# Patient Record
Sex: Male | Born: 1980
Health system: Southern US, Community
[De-identification: ages and names within clinical notes are randomized; demographics above are authoritative.]

## PROBLEM LIST (undated history)

## (undated) DIAGNOSIS — I499 Cardiac arrhythmia, unspecified: Secondary | ICD-10-CM

## (undated) DIAGNOSIS — K219 Gastro-esophageal reflux disease without esophagitis: Secondary | ICD-10-CM

## (undated) DIAGNOSIS — T7840XA Allergy, unspecified, initial encounter: Secondary | ICD-10-CM

## (undated) DIAGNOSIS — J45909 Unspecified asthma, uncomplicated: Secondary | ICD-10-CM

## (undated) HISTORY — DX: Allergy, unspecified, initial encounter: T78.40XA

## (undated) HISTORY — DX: Unspecified asthma, uncomplicated: J45.909

## (undated) HISTORY — DX: Cardiac arrhythmia, unspecified: I49.9

## (undated) HISTORY — DX: Gastro-esophageal reflux disease without esophagitis: K21.9

---

## 2011-09-28 ENCOUNTER — Ambulatory Visit (INDEPENDENT_AMBULATORY_CARE_PROVIDER_SITE_OTHER): Payer: 59

## 2011-09-28 DIAGNOSIS — K047 Periapical abscess without sinus: Secondary | ICD-10-CM

## 2011-09-28 DIAGNOSIS — K029 Dental caries, unspecified: Secondary | ICD-10-CM

## 2011-10-01 ENCOUNTER — Ambulatory Visit (INDEPENDENT_AMBULATORY_CARE_PROVIDER_SITE_OTHER): Payer: 59

## 2011-10-01 DIAGNOSIS — R05 Cough: Secondary | ICD-10-CM

## 2011-10-01 DIAGNOSIS — R059 Cough, unspecified: Secondary | ICD-10-CM

## 2011-10-01 DIAGNOSIS — R509 Fever, unspecified: Secondary | ICD-10-CM

## 2011-10-01 DIAGNOSIS — J111 Influenza due to unidentified influenza virus with other respiratory manifestations: Secondary | ICD-10-CM

## 2011-12-19 ENCOUNTER — Ambulatory Visit (INDEPENDENT_AMBULATORY_CARE_PROVIDER_SITE_OTHER): Payer: 59 | Admitting: Physician Assistant

## 2011-12-19 VITALS — BP 112/74 | HR 51 | Temp 98.3°F | Resp 16 | Ht 71.0 in | Wt 202.0 lb

## 2011-12-19 DIAGNOSIS — K089 Disorder of teeth and supporting structures, unspecified: Secondary | ICD-10-CM

## 2011-12-19 DIAGNOSIS — K029 Dental caries, unspecified: Secondary | ICD-10-CM

## 2011-12-19 DIAGNOSIS — K0889 Other specified disorders of teeth and supporting structures: Secondary | ICD-10-CM

## 2011-12-19 MED ORDER — HYDROCODONE-ACETAMINOPHEN 5-325 MG PO TABS: 1.0000 | ORAL_TABLET | Freq: Four times a day (QID) | ORAL | Status: AC | PRN

## 2011-12-19 MED ORDER — AMOXICILLIN 875 MG PO TABS: 875.0000 mg | ORAL_TABLET | Freq: Two times a day (BID) | ORAL | Status: AC

## 2011-12-19 NOTE — Patient Instructions (Addendum)
You may take up to 800 mg of ibuprofen, up to 3 times daily, but not more each dose or more times per day.  Good luck finding a new dentist!

## 2011-12-19 NOTE — Progress Notes (Signed)
  Subjective:    Patient ID: Bobby Hobbs, male    DOB: 1981-02-09, 30 y.o.   MRN: [id number]  HPI This patient presents, accompanied by his wife, complaining of severe dental pain that began last night. Unable to sleep. He is between dentist. He has a chronic dental problems-his teeth decaying teeth with good care. His father had the same problem the patient works at [employer] center. His wife reports that he feels warm to touch but he denies any fever. No nasal congestion or cough. Wife also notes some mild fullness on the right side of his face and he reports a lump palpable through the cheek on the right jaw.   Review of Systems As above.    Objective:   Physical Exam  Vital signs noted. Well-developed, well nourished WM who is awake, alert and oriented, in NAD. HEENT: [place]/AT, PERRL, EOMI.  Sclera and conjunctiva are clear.  EAC are patent, TMs are normal in appearance. Nasal mucosa is pink and moist. OP is clear. Significant dental decay, worse on the bottom row of teeth, primarily the molars. Several of them are rotted down to the gumline. There is edema noted of the gingiva. I palpate the lump in the right mid jaw. Neck: supple, non-tender, no lymphadenopathey, thyromegaly. Fullness in the right tonsillar region. Heart: RRR, no murmur Lungs: CTA Abdomen: normo-active bowel sounds, supple, non-tender, no mass or organomegaly. Extremities: no cyanosis, clubbing or edema. Skin: warm and dry without rash.       Assessment & Plan:  Dental pain, decay. Probable early abscess.  Amoxicillin 875 mg, 1 by mouth twice a day x10 days. Norco 5/325 mg one to 2 by mouth every 6 hours when necessary pain rest, fluids. 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

## 2012-04-22 ENCOUNTER — Ambulatory Visit (INDEPENDENT_AMBULATORY_CARE_PROVIDER_SITE_OTHER): Payer: 59 | Admitting: Emergency Medicine

## 2012-04-22 VITALS — BP 105/71 | HR 56 | Temp 98.0°F | Resp 18 | Ht 72.75 in | Wt 203.0 lb

## 2012-04-22 DIAGNOSIS — J019 Acute sinusitis, unspecified: Secondary | ICD-10-CM

## 2012-04-22 DIAGNOSIS — J309 Allergic rhinitis, unspecified: Secondary | ICD-10-CM

## 2012-04-22 MED ORDER — AMOXICILLIN-POT CLAVULANATE 875-125 MG PO TABS: 1.0000 | ORAL_TABLET | Freq: Two times a day (BID) | ORAL | Status: AC

## 2012-04-22 MED ORDER — FLUTICASONE PROPIONATE 50 MCG/ACT NA SUSP: 2.0000 | Freq: Every day | NASAL | Status: DC

## 2012-04-22 NOTE — Progress Notes (Signed)
  Subjective:    Patient ID: Bobby Hobbs, male    DOB: July 07, 1981, 30 y.o.   MRN: [id number]  HPI  Pt with nasal drainage and congestion notes drainage at time is clear at times green he has had symptoms for 3 days now. Has history of allergic rhinitis. Symptoms seem to be worsened with weather, damp and warm past few weeks. Also recently has had complaints of facial pressure.  Review of Systems     Objective:   Physical Exam HEENT TMs are clear. Nose is congested turbinates are blue and swollen and red. Neck is supple chest is clear to auscultation and percussion        Assessment & Plan:  Allergic rhinitis and Sinusitis Augmentin 875 mg for 10 days Flonase Rx given

## 2012-04-22 NOTE — Patient Instructions (Addendum)

## 2013-02-27 ENCOUNTER — Ambulatory Visit (INDEPENDENT_AMBULATORY_CARE_PROVIDER_SITE_OTHER): Payer: 59 | Admitting: Family Medicine

## 2013-02-27 VITALS — BP 111/68 | HR 108 | Temp 98.8°F | Resp 18 | Ht 72.0 in | Wt 206.0 lb

## 2013-02-27 DIAGNOSIS — E860 Dehydration: Secondary | ICD-10-CM

## 2013-02-27 DIAGNOSIS — I499 Cardiac arrhythmia, unspecified: Secondary | ICD-10-CM

## 2013-02-27 DIAGNOSIS — R112 Nausea with vomiting, unspecified: Secondary | ICD-10-CM

## 2013-02-27 DIAGNOSIS — R197 Diarrhea, unspecified: Secondary | ICD-10-CM

## 2013-02-27 LAB — COMPREHENSIVE METABOLIC PANEL
Albumin: 4.7 g/dL (ref 3.5–5.2)
CO2: 25 mEq/L (ref 19–32)
Calcium: 9.5 mg/dL (ref 8.4–10.5)
Glucose, Bld: 104 mg/dL — ABNORMAL HIGH (ref 70–99)
Potassium: 4 mEq/L (ref 3.5–5.3)
Sodium: 137 mEq/L (ref 135–145)
Total Protein: 6.9 g/dL (ref 6.0–8.3)

## 2013-02-27 LAB — POCT URINALYSIS DIPSTICK
Bilirubin, UA: NEGATIVE
Glucose, UA: NEGATIVE
Ketones, UA: NEGATIVE
Leukocytes, UA: NEGATIVE
Protein, UA: NEGATIVE

## 2013-02-27 LAB — POCT CBC
Granulocyte percent: 89.3 %G — AB (ref 37–80)
HCT, POC: 43.1 % — AB (ref 43.5–53.7)
Hemoglobin: 13.9 g/dL — AB (ref 14.1–18.1)
Lymph, poc: 0.7 (ref 0.6–3.4)
MCHC: 32.3 g/dL (ref 31.8–35.4)
MCV: 87.8 fL (ref 80–97)
POC Granulocyte: 8.8 — AB (ref 2–6.9)

## 2013-02-27 LAB — POCT UA - MICROSCOPIC ONLY
RBC, urine, microscopic: NEGATIVE
WBC, Ur, HPF, POC: NEGATIVE

## 2013-02-27 MED ORDER — CIPROFLOXACIN HCL 500 MG PO TABS: 500.0000 mg | ORAL_TABLET | Freq: Two times a day (BID) | ORAL | Status: DC

## 2013-02-27 NOTE — Progress Notes (Signed)
[facility] [address] [place] [address] [phone]  Date:  02/27/2013   Name:  Bobby Hobbs   DOB:  22-Jun-1981   MRN:  [id number]  PCP:  No PCP Per Patient    Chief Complaint: Emesis and Diarrhea   History of Present Illness:  Bobby Hobbs is a 32 y.o. very pleasant male patient who presents with the following:  He is here today with possible food poisoning.  Last night around 11 pm he had onset of diarrhea, stomach cramps and vomiting.  He finally stopped vomiting around 0500 today. He still has diarrhea- last episode a few minutes ago.   He has not seen any blood in his vomit or emesis.    He had a temp of 99.7 this am.  He has noted chills and aches- these are now getting better.    He ate tuna last night but did not think it was bad.  It "tasted fine."  No other suspicious foods.  He has been able to tolerate some ginger ale and saltines so far today.   He does not have abdominal pain currently- he did have cramps last night  He is otherwise generally healhty.    No recent abx use.  No sick contacts known.  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  He is generally healthy and feels ok, but does need a note for work.    There are no active problems to display for this patient.   No past medical history on file.  No past surgical history on file.  History  Substance Use Topics  . Smoking status: Never Smoker   . Smokeless tobacco: Not on file  . Alcohol Use: 0.0 oz/week     Comment: once a year    Family History  Problem Relation Age of Onset  . Diabetes Father   . Diabetes Maternal Grandmother     Allergies  Allergen Reactions  . Hycodan (Hydrocodone-Homatropine) Nausea And Vomiting    Tolerates hydrocodone with acetaminophen    Medication list has been reviewed and updated.  Current Outpatient Prescriptions on File Prior to Visit  Medication Sig Dispense Refill  .  fluticasone (FLONASE) 50 MCG/ACT nasal spray Place 2 sprays into the nose daily.  16 g  6   No current facility-administered medications on file prior to visit.    Review of Systems:  As per HPI- otherwise negative.   Physical Examination: Filed Vitals:   02/27/13 0838  BP: 94/64  Pulse: 62  Temp: 98.8 F (37.1 C)  Resp: 18   Filed Vitals:   02/27/13 0838  Height: 6' (1.829 m)  Weight: 206 lb (93.441 kg)   Body mass index is 27.93 kg/(m^2). Ideal Body Weight: Weight in (lb) to have BMI = 25: 183.9  GEN: WDWN, NAD, Non-toxic, A & O x 3, slightly pale in appearance HEENT: Atraumatic, Normocephalic. Neck supple. No masses, No LAD. Ears and Nose: No external deformity. CV: RRR with skipped beats- suspect PVC's, No M/G/R. No JVD. No thrill. No extra heart sounds. PULM: CTA B, no wheezes, crackles, rhonchi. No retractions. No resp. distress. No accessory muscle use. ABD: S, NT, ND, +BS. No rebound. No HSM.  Abdomen is benign EXTR: No c/c/e NEURO Normal gait.  PSYCH: Normally interactive. Conversant. Not depressed or anxious appearing.  Calm demeanor.    Results for orders placed in visit on 02/27/13  POCT CBC  Result Value Range   WBC 9.9  4.6 - 10.2 K/uL   Lymph, poc 0.7  0.6 - 3.4   POC LYMPH PERCENT 7.1 (*) 10 - 50 %L   MID (cbc) 0.4  0 - 0.9   POC MID % 3.6  0 - 12 %M   POC Granulocyte 8.8 (*) 2 - 6.9   Granulocyte percent 89.3 (*) 37 - 80 %G   RBC 4.91  4.69 - 6.13 M/uL   Hemoglobin 13.9 (*) 14.1 - 18.1 g/dL   HCT, POC 16.1 (*) 09.6 - 53.7 %   MCV 87.8  80 - 97 fL   MCH, POC 28.3  27 - 31.2 pg   MCHC 32.3  31.8 - 35.4 g/dL   RDW, POC 04.5     Platelet Count, POC 161  142 - 424 K/uL   MPV 11.4  0 - 99.8 fL   Performed EKG due to irregular rhythm auscultated.  Assume PVC's.  He denies any CP, SOB, palpitations or syncope. No unusual family cardiac history.   EKG: SR with a few PVCs, occasional PVCs on rhythm strip. No concerning ST elevation or depression     Noted to have BP of 80/60 on recheck- decided to give a liter of IVF.  Gave fluids and BP to 100/80, he felt better.  Final BP check 110/70 prior to discharge home.  His color was improved and he felt comfortable going home.   Assessment and Plan: Diarrhea - Plan: Comprehensive metabolic panel, ciprofloxacin (CIPRO) 500 MG tablet  Nausea and vomiting - Plan: POCT CBC, Comprehensive metabolic panel  Arrhythmia - Plan: EKG 12-Lead  Possible food borne illness.  He is getting better from a vomiting standpoint but diarrhea continues.  He would like to go home and work on oral rehydration.  Given rx for cipro which he can use if the diarrhea continues today.  However, his illness may already be resolving. He may use imodium with caution.    Signed Abbe Amsterdam, MD  Called pt to come in for a recheck when I received his creatinine.  He is feeling ok, checked orthostatic BP and urine.   BP stable on orthostatic testing, but pulse did come up more than 20 BPM.  Urine looks fine.  He is feeling better, has been tolerating liquids.  Some diarrhea still but no vomiting.  He wants to continue to rehydrate by mouth.  Plan to recheck BMP in one week.   He states his typical BP is 110/70  Results for orders placed in visit on 02/27/13  COMPREHENSIVE METABOLIC PANEL      Result Value Range   Sodium 137  135 - 145 mEq/L   Potassium 4.0  3.5 - 5.3 mEq/L   Chloride 101  96 - 112 mEq/L   CO2 25  19 - 32 mEq/L   Glucose, Bld 104 (*) 70 - 99 mg/dL   BUN 12  6 - 23 mg/dL   Creat 4.09 (*) 8.11 - 1.35 mg/dL   Total Bilirubin 1.2  0.3 - 1.2 mg/dL   Alkaline Phosphatase 70  39 - 117 U/L   AST 18  0 - 37 U/L   ALT 26  0 - 53 U/L   Total Protein 6.9  6.0 - 8.3 g/dL   Albumin 4.7  3.5 - 5.2 g/dL   Calcium 9.5  8.4 - 91.4 mg/dL  POCT CBC      Result Value Range   WBC 9.9  4.6 -  10.2 K/uL   Lymph, poc 0.7  0.6 - 3.4   POC LYMPH PERCENT 7.1 (*) 10 - 50 %L   MID (cbc) 0.4  0 - 0.9   POC MID % 3.6  0 -  12 %M   POC Granulocyte 8.8 (*) 2 - 6.9   Granulocyte percent 89.3 (*) 37 - 80 %G   RBC 4.91  4.69 - 6.13 M/uL   Hemoglobin 13.9 (*) 14.1 - 18.1 g/dL   HCT, POC 16.1 (*) 09.6 - 53.7 %   MCV 87.8  80 - 97 fL   MCH, POC 28.3  27 - 31.2 pg   MCHC 32.3  31.8 - 35.4 g/dL   RDW, POC 04.5     Platelet Count, POC 161  142 - 424 K/uL   MPV 11.4  0 - 99.8 fL  POCT UA - MICROSCOPIC ONLY      Result Value Range   WBC, Ur, HPF, POC NEG     RBC, urine, microscopic NEG     Bacteria, U Microscopic NEG     Mucus, UA NEG     Epithelial cells, urine per micros NEG     Crystals, Ur, HPF, POC NEG     Casts, Ur, LPF, POC NEG     Yeast, UA NEG    POCT URINALYSIS DIPSTICK      Result Value Range   Color, UA YELLOW     Clarity, UA CLEAR     Glucose, UA NEG     Bilirubin, UA NEG     Ketones, UA NEG     Spec Grav, UA 1.010     Blood, UA NEG     pH, UA 6.0     Protein, UA NEG     Urobilinogen, UA 0.2     Nitrite, UA NEG     Leukocytes, UA Negative

## 2013-02-27 NOTE — Patient Instructions (Addendum)
Rest and drink plenty of fluids today, and eat foods with salt such as pretzels or crackers to help rehydrate yourself.  gatorade is also a good idea.  Eat a bland diet for the next few days- simple carbs and easy to digest foods.  If you need to you can use some imodium OTC.  If the diarrhea continues today your can start the cipro antibiotic rx- take 1 pill twice a day for 3 days   If you are not feeling a lot better over the next 2 days or so please give me a call- come back sooner if worse.

## 2013-06-03 ENCOUNTER — Telehealth: Payer: Self-pay

## 2013-06-03 ENCOUNTER — Ambulatory Visit (INDEPENDENT_AMBULATORY_CARE_PROVIDER_SITE_OTHER): Payer: 59 | Admitting: Family Medicine

## 2013-06-03 VITALS — BP 118/82 | HR 64 | Temp 98.2°F | Resp 14 | Ht 71.0 in | Wt 218.6 lb

## 2013-06-03 DIAGNOSIS — K029 Dental caries, unspecified: Secondary | ICD-10-CM | POA: Insufficient documentation

## 2013-06-03 DIAGNOSIS — K044 Acute apical periodontitis of pulpal origin: Secondary | ICD-10-CM

## 2013-06-03 DIAGNOSIS — K0889 Other specified disorders of teeth and supporting structures: Secondary | ICD-10-CM

## 2013-06-03 DIAGNOSIS — K047 Periapical abscess without sinus: Secondary | ICD-10-CM

## 2013-06-03 MED ORDER — HYDROCODONE-ACETAMINOPHEN 5-325 MG PO TABS: 1.0000 | ORAL_TABLET | Freq: Three times a day (TID) | ORAL | Status: DC | PRN

## 2013-06-03 MED ORDER — OXYCODONE-ACETAMINOPHEN 5-325 MG PO TABS: 1.0000 | ORAL_TABLET | Freq: Three times a day (TID) | ORAL | Status: DC | PRN

## 2013-06-03 MED ORDER — AMOXICILLIN 875 MG PO TABS: 875.0000 mg | ORAL_TABLET | Freq: Two times a day (BID) | ORAL | Status: DC

## 2013-06-03 NOTE — Patient Instructions (Addendum)
Use the amoxicillin antibiotic as directed, and keep your mouth clean with frequent rinses.    Call your dentist ASAP!  If you are getting worse, have a fever, etc please let us know.

## 2013-06-03 NOTE — Telephone Encounter (Signed)
Dr. Patsy Lager please advise on further pain meds for this pt that you saw this morning

## 2013-06-03 NOTE — Telephone Encounter (Signed)
JESSICA STATES THE PAIN MEDICINE HE WAS GIVEN FOR THE ABSCESS ISN'T TOUCHING THE PAIN. NEED SOMETHING ELSE STRONGER PLEASE CALL [phone]   [facility] ON [address]

## 2013-06-03 NOTE — Telephone Encounter (Signed)
Called him back- spoke with pt and his wife.  He has tried taking 2 of the hydrocodone pills but has not had relief.  His tooth pain has gotten worse.  Will put an rx for percocet up front for his wife to pick up.  If not helpful they are to come back in, go to the ER or visit dentist asap.

## 2013-06-03 NOTE — Progress Notes (Signed)
[facility] [address] [place] [address] [phone]  Date:  06/03/2013   Name:  Bobby Hobbs   DOB:  [birth year]   MRN:  [id number]  PCP:  No PCP Per Patient    Chief Complaint: Dental Pain   History of Present Illness:  Bobby Hobbs is a 32 y.o. very pleasant male patient who presents with the following:  Here today with dental pain.  He was here with same 12/2011, treated with amoxicillin and norco.  He has a history of nausea and vomiting with hycodan, but is able to tolerate hydrocodone/ apap.  Otherwise generally healthy.   Checked controlled substance database- clear this year.    He is afraid he has an abscessed tooth on the lower right jaw.   He noted this yesterday.   He has not had trouble with this particular tooth in the past.   He has a family history of poor teeth and frequent caries.   He has not noted any fevers or chills, but does have some swelling. He plans to make an appt with his dentist today.    There are no active problems to display for this patient.   Past Medical History  Diagnosis Date  . Allergy     History reviewed. No pertinent past surgical history.  History  Substance Use Topics  . Smoking status: Never Smoker   . Smokeless tobacco: Not on file  . Alcohol Use: 0.0 oz/week     Comment: once a year    Family History  Problem Relation Age of Onset  . Diabetes Father   . Diabetes Maternal Grandmother     Allergies  Allergen Reactions  . Hycodan [Hydrocodone-Homatropine] Nausea And Vomiting    Tolerates hydrocodone with acetaminophen    Medication list has been reviewed and updated.  Current Outpatient Prescriptions on File Prior to Visit  Medication Sig Dispense Refill  . ciprofloxacin (CIPRO) 500 MG tablet Take 1 tablet (500 mg total) by mouth 2 (two) times daily.  6 tablet  0  . fluticasone (FLONASE) 50 MCG/ACT nasal spray Place 2 sprays into the nose daily.  16 g  6   No  current facility-administered medications on file prior to visit.    Review of Systems:  As per HPI- otherwise negative. He is otherwise generally healthy  Physical Examination: Filed Vitals:   06/03/13 0813  BP: 118/82  Pulse: 64  Temp: 98.2 F (36.8 C)  Resp: 14   Filed Vitals:   06/03/13 0813  Height: 5\' 11"  (1.803 m)  Weight: 218 lb 9.6 oz (99.156 kg)   Body mass index is 30.5 kg/(m^2). Ideal Body Weight: Weight in (lb) to have BMI = 25: 178.9  GEN: WDWN, NAD, Non-toxic, A & O x 3, looks well HEENT: Atraumatic, Normocephalic. Neck supple. No masses, No LAD.  Bilateral TM wnl, oropharynx normal.  PEERL,EOMI.   Very poor dentition.  He has multiple broken teeth.  He indicates tenderness on the right mandibular area- all of these teeth are broken off, and there is redness and irritation around the gums.  No abscess or apparent fluctuance.   Ears and Nose: No external deformity. CV: RRR, No M/G/R. No JVD. No thrill. No extra heart sounds. PULM: CTA B, no wheezes, crackles, rhonchi. No retractions. No resp. distress. No accessory muscle use. EXTR: No c/c/e NEURO Normal gait.  PSYCH: Normally interactive. Conversant. Not depressed or anxious appearing.  Calm demeanor.    Assessment  and Plan: Dental infection - Plan: amoxicillin (AMOXIL) 875 MG tablet  Pain due to dental caries - Plan: HYDROcodone-acetaminophen (NORCO/VICODIN) 5-325 MG per tablet  Recurrent dental infection, very poor dentition and multiple caries and broken teeth  Treat with amoxicillin and good hygiene.  He is able to take hydrocodone apap for pain.  Cautioned regarding sedation.  He will follow-up with his dentist asap.   Signed Abbe Amsterdam, MD

## 2013-06-05 ENCOUNTER — Telehealth: Payer: Self-pay

## 2013-06-05 NOTE — Telephone Encounter (Signed)
Needs a return to work to be off for today and tomorrow, was seen by Dr. Patsy Lager on 06/03/13- abscess.   Pleas call when ready  [phone]

## 2013-06-06 NOTE — Telephone Encounter (Signed)
Note provided  patient advised . 

## 2013-10-22 ENCOUNTER — Ambulatory Visit (INDEPENDENT_AMBULATORY_CARE_PROVIDER_SITE_OTHER): Payer: 59 | Admitting: Family Medicine

## 2013-10-22 VITALS — BP 120/80 | HR 84 | Temp 98.1°F | Resp 18 | Ht 71.0 in | Wt 212.0 lb

## 2013-10-22 DIAGNOSIS — J3489 Other specified disorders of nose and nasal sinuses: Secondary | ICD-10-CM

## 2013-10-22 DIAGNOSIS — R059 Cough, unspecified: Secondary | ICD-10-CM

## 2013-10-22 DIAGNOSIS — R0981 Nasal congestion: Secondary | ICD-10-CM

## 2013-10-22 DIAGNOSIS — J029 Acute pharyngitis, unspecified: Secondary | ICD-10-CM

## 2013-10-22 DIAGNOSIS — R05 Cough: Secondary | ICD-10-CM

## 2013-10-22 LAB — POCT INFLUENZA A/B
Influenza A, POC: NEGATIVE
Influenza B, POC: NEGATIVE

## 2013-10-22 LAB — POCT RAPID STREP A (OFFICE): RAPID STREP A SCREEN: NEGATIVE

## 2013-10-22 NOTE — Progress Notes (Signed)
[facility] [address], [place] [address] [phone]  Date:  10/22/2013   Name:  Bobby Hobbs   DOB:  [birth year]   MRN:  [id number]  PCP:  No PCP Per Patient    Chief Complaint: Sore Throat and Cough   History of Present Illness:  Bobby Hobbs is a 33 y.o. very pleasant male patient who presents with the following:  Today is Tuesday. On Saturday he noted a ST but did not thnk too much of it.  However yesterday he noted a more severe ST.  He is not sure but thinks he had a fever last night.  He has felt nauseated, had a HA, stuffy nose, and cough.  The cough can be productive sometimes.  The phelm that he coughs up is generally yellow He has had some diarrhea as well- on occasion.    No fever reducers today.   He is generally healthy  Patient Active Problem List   Diagnosis Date Noted  . Dental caries 06/03/2013    Past Medical History  Diagnosis Date  . Allergy     History reviewed. No pertinent past surgical history.  History  Substance Use Topics  . Smoking status: Never Smoker   . Smokeless tobacco: Not on file  . Alcohol Use: 0.0 oz/week     Comment: once a year    Family History  Problem Relation Age of Onset  . Diabetes Father   . Diabetes Maternal Grandmother     Allergies  Allergen Reactions  . Hycodan [Hydrocodone-Homatropine] Nausea And Vomiting    Tolerates hydrocodone with acetaminophen    Medication list has been reviewed and updated.  Current Outpatient Prescriptions on File Prior to Visit  Medication Sig Dispense Refill  . fluticasone (FLONASE) 50 MCG/ACT nasal spray Place 2 sprays into the nose daily.  16 g  6  . HYDROcodone-acetaminophen (NORCO/VICODIN) 5-325 MG per tablet Take 1 tablet by mouth every 8 (eight) hours as needed for pain.  20 tablet  0  . oxyCODONE-acetaminophen (ROXICET) 5-325 MG per tablet Take 1 tablet by mouth every 8 (eight) hours as needed for pain.  15 tablet  0   No  current facility-administered medications on file prior to visit.    Review of Systems:  As per HPI- otherwise negative.   Physical Examination: Filed Vitals:   10/22/13 0804  BP: 120/80  Pulse: 84  Temp: 98.1 F (36.7 C)  Resp: 18   Filed Vitals:   10/22/13 0804  Height: 5\' 11"  (1.803 m)  Weight: 212 lb (96.163 kg)   Body mass index is 29.58 kg/(m^2). Ideal Body Weight: Weight in (lb) to have BMI = 25: 178.9  GEN: WDWN, NAD, Non-toxic, A & O x 3, looks well HEENT: Atraumatic, Normocephalic. Neck supple. No masses, No LAD.  Bilateral TM wnl, oropharynx normal.  PEERL,EOMI.   Nasal cavity inflamed  Ears and Nose: No external deformity. CV: RRR, No M/G/R. No JVD. No thrill. No extra heart sounds. PULM: CTA B, no wheezes, crackles, rhonchi. No retractions. No resp. distress. No accessory muscle use. ABD: S, NT, ND, +BS. No rebound. No HSM. EXTR: No c/c/e NEURO Normal gait.  PSYCH: Normally interactive. Conversant. Not depressed or anxious appearing.  Calm demeanor.   Results for orders placed in visit on 10/22/13  POCT RAPID STREP A (OFFICE)      Result Value Range   Rapid Strep A Screen Negative  Negative  POCT INFLUENZA A/B  Result Value Range   Influenza A, POC Negative     Influenza B, POC Negative       Assessment and Plan: Acute pharyngitis - Plan: POCT rapid strep A, POCT Influenza A/B  Cough - Plan: POCT Influenza A/B  Nasal congestion - Plan: POCT Influenza A/B  Likely viral URI. Treat with OTC medications as needed.  Note for his job See patient instructions for more details.     Signed Lamar Blinks, MD

## 2013-10-22 NOTE — Patient Instructions (Signed)
Use some delsym OTC for cough and ibuprofen or tylenol as needed.  If you are not feeling better soon please let me know- Sooner if worse.

## 2013-11-16 ENCOUNTER — Ambulatory Visit (INDEPENDENT_AMBULATORY_CARE_PROVIDER_SITE_OTHER): Payer: 59 | Admitting: Family Medicine

## 2013-11-16 VITALS — BP 100/66 | HR 74 | Temp 98.0°F | Resp 18 | Wt 216.0 lb

## 2013-11-16 DIAGNOSIS — J329 Chronic sinusitis, unspecified: Secondary | ICD-10-CM

## 2013-11-16 DIAGNOSIS — J019 Acute sinusitis, unspecified: Secondary | ICD-10-CM

## 2013-11-16 DIAGNOSIS — R059 Cough, unspecified: Secondary | ICD-10-CM

## 2013-11-16 DIAGNOSIS — R05 Cough: Secondary | ICD-10-CM

## 2013-11-16 DIAGNOSIS — J209 Acute bronchitis, unspecified: Secondary | ICD-10-CM

## 2013-11-16 MED ORDER — CEFDINIR 300 MG PO CAPS: 600.0000 mg | ORAL_CAPSULE | Freq: Every day | ORAL | Status: DC

## 2013-11-16 MED ORDER — GUAIFENESIN-CODEINE 100-10 MG/5ML PO SOLN: 10.0000 mL | ORAL | Status: DC | PRN

## 2013-11-16 MED ORDER — BENZONATATE 100 MG PO CAPS: 100.0000 mg | ORAL_CAPSULE | Freq: Three times a day (TID) | ORAL | Status: DC | PRN

## 2013-11-16 NOTE — Patient Instructions (Signed)
Drink plenty of fluids and try to get sufficient rest  Take the antibiotic, Omnicef, one twice daily  Use the cough syrup 1-2 teaspoons every 4-6 hours as needed for cough. It will make your little drowsy. You may not want to take if you're going to work.  Use the Tessalon(benzonatate) one to 2 pills every 6 hours as needed for mild or cough. This tends to not cause drowsiness it can be taken when you're working.  Return if worse

## 2013-11-16 NOTE — Progress Notes (Signed)
Subjective: 33 year old man who has been here last month with a respiratory tract infection. He improved after that visit, but cough never went away and is continued to since. Lately it has gotten worse. He is coughing up more phlegm. It bothers him at nighttime. He does not smoke. He is not wheezing. He doesn't have a lot of of sinus complaints or sore throat or your pains. It is mostly the cough. Yesterday he did vomit a couple of times.  Objective: Fairly healthy-appearing young man in no major distress but he does have a hacking cough. His TMs are normal. Sinuses nontender. Throat not erythematous. Neck supple without significant nodes. Chest is clear to auscultation. Heart regular without murmurs.  Assessment: Cough, persistent Probable sinusitis/bronchitis  Plan: Will go ahead and give a round of antibiotics, Omnicef. If he is not doing better we'll need to do further workup.

## 2013-11-29 ENCOUNTER — Ambulatory Visit (INDEPENDENT_AMBULATORY_CARE_PROVIDER_SITE_OTHER): Payer: 59 | Admitting: Physician Assistant

## 2013-11-29 VITALS — BP 122/74 | HR 85 | Temp 97.9°F | Resp 16 | Ht 70.75 in | Wt 214.6 lb

## 2013-11-29 DIAGNOSIS — K029 Dental caries, unspecified: Secondary | ICD-10-CM

## 2013-11-29 DIAGNOSIS — K0889 Other specified disorders of teeth and supporting structures: Secondary | ICD-10-CM

## 2013-11-29 DIAGNOSIS — K089 Disorder of teeth and supporting structures, unspecified: Secondary | ICD-10-CM

## 2013-11-29 MED ORDER — HYDROCODONE-ACETAMINOPHEN 5-325 MG PO TABS: 1.0000 | ORAL_TABLET | Freq: Three times a day (TID) | ORAL | Status: DC | PRN

## 2013-11-29 MED ORDER — AMOXICILLIN 875 MG PO TABS: 875.0000 mg | ORAL_TABLET | Freq: Two times a day (BID) | ORAL | Status: DC

## 2013-11-29 NOTE — Progress Notes (Signed)
   Subjective:    Patient ID: [place], male    DOB: 1980/12/31, 33 y.o.   MRN: [id number]  HPI 33 year old male presents for evaluation of right sided dental pain that started last night. Admits the pain has improved slightly but he does continue to have discomfort in the right lower jaw.  Hx of dental caries and abscesses - wanted to come in for antibiotics right away so that symptoms do not worsen. He does plan on calling a dentist today to establish care.  Denies fever, chills, nausea, vomiting, headache, trismus.  He took a leftover Norco last night that did not relieve his pain.   Patient is otherwise doing well with no other concerns today.      Review of Systems  Constitutional: Negative for fever and chills.  HENT: Positive for dental problem. Negative for sore throat.   Gastrointestinal: Negative for nausea and vomiting.       Objective:   Physical Exam  Constitutional: He is oriented to person, place, and time. He appears well-developed and well-nourished.  HENT:  Head: Normocephalic and atraumatic.  Right Ear: External ear normal.  Left Ear: External ear normal.  Mouth/Throat: Uvula is midline, oropharynx is clear and moist and mucous membranes are normal. No oral lesions. No trismus in the jaw. Abnormal dentition. Dental caries present. No dental abscesses.  Eyes: Conjunctivae are normal.  Neck: Normal range of motion. Neck supple.  Cardiovascular: Normal rate.   Pulmonary/Chest: Effort normal.  Neurological: He is alert and oriented to person, place, and time.  Psychiatric: He has a normal mood and affect. His behavior is normal. Judgment and thought content normal.          Assessment & Plan:  Pain, dental - Plan: amoxicillin (AMOXIL) 875 MG tablet, HYDROcodone-acetaminophen (NORCO) 5-325 MG per tablet  Dental caries - Plan: amoxicillin (AMOXIL) 875 MG tablet, HYDROcodone-acetaminophen (NORCO) 5-325 MG per tablet  Patient has multiple dental  caries and broken teeth. No abscess at this time but will place on amoxicillin 875 mg bid x 10 days. Norco5/325 mg q8hours prn pain. Call dentist today to establish care. Patient understands and will do so. Follow up here or in ED if acutely worsening or if symptoms fail to improve.

## 2014-03-04 ENCOUNTER — Encounter: Payer: Self-pay | Admitting: Family Medicine

## 2014-04-15 ENCOUNTER — Ambulatory Visit (INDEPENDENT_AMBULATORY_CARE_PROVIDER_SITE_OTHER): Payer: 59 | Admitting: Physician Assistant

## 2014-04-15 VITALS — BP 106/81 | HR 66 | Temp 98.0°F | Resp 18 | Wt 216.0 lb

## 2014-04-15 DIAGNOSIS — R0981 Nasal congestion: Secondary | ICD-10-CM

## 2014-04-15 DIAGNOSIS — J3489 Other specified disorders of nose and nasal sinuses: Secondary | ICD-10-CM

## 2014-04-15 DIAGNOSIS — R05 Cough: Secondary | ICD-10-CM

## 2014-04-15 DIAGNOSIS — J0100 Acute maxillary sinusitis, unspecified: Secondary | ICD-10-CM

## 2014-04-15 DIAGNOSIS — R059 Cough, unspecified: Secondary | ICD-10-CM

## 2014-04-15 MED ORDER — AMOXICILLIN-POT CLAVULANATE 875-125 MG PO TABS: 1.0000 | ORAL_TABLET | Freq: Two times a day (BID) | ORAL | Status: DC

## 2014-04-15 NOTE — Progress Notes (Signed)
   Subjective:    Patient ID: Bobby Hobbs, male    DOB: 09-21-1981, 33 y.o.   MRN: [id number]  HPI  33 year old male presents for evaluation of 3-4 day history of nasal congestion, sinus headache, sinus pain, and PND.  Has slight tickle in his throat that causes occasional cough but no wheezing, SOB, or chest pain.  Complains of a lot of thick nasal discharge. Had 1 episode of subjective chills last night. No documented fever.  Denies nausea, vomiting, dizziness, otalgia, sore throat, or hemoptysis.  Has taken OTC cold medicine that has not helped much.  Patient is otherwise doing well with no other concerns today.    Review of Systems  Constitutional: Positive for chills. Negative for fever.  HENT: Positive for congestion, postnasal drip and sinus pressure. Negative for ear pain and sore throat.   Respiratory: Negative for cough, chest tightness, shortness of breath and wheezing.   Cardiovascular: Negative for chest pain.  Gastrointestinal: Negative for nausea and vomiting.  Neurological: Positive for headaches.       Objective:   Physical Exam  Constitutional: He is oriented to person, place, and time. He appears well-developed and well-nourished.  HENT:  Head: Normocephalic and atraumatic.  Right Ear: Hearing, tympanic membrane, external ear and ear canal normal.  Left Ear: Hearing, tympanic membrane, external ear and ear canal normal.  Nose: Right sinus exhibits maxillary sinus tenderness. Right sinus exhibits no frontal sinus tenderness. Left sinus exhibits maxillary sinus tenderness. Left sinus exhibits no frontal sinus tenderness.  Mouth/Throat: Uvula is midline, oropharynx is clear and moist and mucous membranes are normal.  Eyes: Conjunctivae are normal.  Neck: Normal range of motion. Neck supple.  Cardiovascular: Normal rate, regular rhythm and normal heart sounds.   Pulmonary/Chest: Effort normal and breath sounds normal.  Lymphadenopathy:    He has no cervical  adenopathy.  Neurological: He is alert and oriented to person, place, and time.  Psychiatric: He has a normal mood and affect. His behavior is normal. Judgment and thought content normal.          Assessment & Plan:  Acute maxillary sinusitis, recurrence not specified - Plan: amoxicillin-clavulanate (AUGMENTIN) 875-125 MG per tablet  Nasal congestion  Cough  Will treat for acute maxillary sinusitis with augmentin 875 mg bid x 7 days Patient has mucinex at home that he will take twice daily to help with congestion Push fluids RTC precautions discussed. F/u if symptoms worsen or fail to improve.

## 2014-05-12 ENCOUNTER — Ambulatory Visit (INDEPENDENT_AMBULATORY_CARE_PROVIDER_SITE_OTHER): Payer: 59

## 2014-05-12 ENCOUNTER — Ambulatory Visit (INDEPENDENT_AMBULATORY_CARE_PROVIDER_SITE_OTHER): Payer: 59 | Admitting: Emergency Medicine

## 2014-05-12 VITALS — BP 124/64 | HR 76 | Temp 98.9°F | Resp 18 | Ht 71.0 in | Wt 211.0 lb

## 2014-05-12 DIAGNOSIS — R51 Headache: Secondary | ICD-10-CM

## 2014-05-12 DIAGNOSIS — R05 Cough: Secondary | ICD-10-CM

## 2014-05-12 DIAGNOSIS — R059 Cough, unspecified: Secondary | ICD-10-CM

## 2014-05-12 DIAGNOSIS — J029 Acute pharyngitis, unspecified: Secondary | ICD-10-CM

## 2014-05-12 DIAGNOSIS — R519 Headache, unspecified: Secondary | ICD-10-CM

## 2014-05-12 LAB — POCT RAPID STREP A (OFFICE): RAPID STREP A SCREEN: NEGATIVE

## 2014-05-12 MED ORDER — CEFDINIR 300 MG PO CAPS: 600.0000 mg | ORAL_CAPSULE | Freq: Every day | ORAL | Status: DC

## 2014-05-12 NOTE — Progress Notes (Signed)
  This chart was scribed for Bobby Queen, MD by Bobby Hobbs, ED Scribe. This patient was seen in room 4 and the patient's care was started at 8:30 AM. Subjective:    Patient ID: Bobby Hobbs, male    DOB: 25-Feb-1981, 33 y.o.   MRN: [id number]  Sore Throat  Associated symptoms include congestion and coughing.  Cough Associated symptoms include a fever and a sore throat. Pertinent negatives include no chills.   Chief Complaint  Patient presents with  . Sore Throat    x 1 day  . Facial Pain    x 1 day  . Cough    x 1 day   HPI Comments: Bobby Hobbs is a 33 y.o. male who presents to the [facility] complaining of worsening constant sore throat and sinus congestion consisting clear mucous. Pt believes he may of had a fever last night after waking from sleep drenched in sweat. Pt reports he was treated for a sinus infection a couple weeks ago with similar symptoms. He reports he was treated with 10 day coarse of abx. Pt has allergies in which he takes claratin. Pt denies using nasal spray due to making him sneeze. Pt denies being a smoker.     Past Medical History  Diagnosis Date  . Allergy    History reviewed. No pertinent past surgical history. Prior to Admission medications   Not on File   Review of Systems  Constitutional: Positive for fever and diaphoresis. Negative for chills.  HENT: Positive for congestion, sinus pressure and sore throat.   Respiratory: Positive for cough.    Objective:   Physical Exam CONSTITUTIONAL: Well developed/well nourished HEAD: Normocephalic/atraumatic EYES: EOMI/PERRL ENMT: Mucous membranes moist, post oropharyngeal erythema without exudate. NECK: supple no meningeal signs SPINE:entire spine nontender CV: S1/S2 noted, no murmurs/rubs/gallops noted LUNGS: Lungs are clear to auscultation bilaterally, no apparent distress ABDOMEN: soft, nontender, no rebound or guarding GU:no cva tenderness NEURO: Pt is  awake/alert, moves all extremitiesx4 EXTREMITIES: pulses normal, full ROM SKIN: warm, color normal PSYCH: no abnormalities of mood noted  Filed Vitals:   05/12/14 0824  BP: 124/64  Pulse: 76  Temp: 98.9 F (37.2 C)  Resp: 18   Results for orders placed in visit on 05/12/14  POCT RAPID STREP A (OFFICE)      Result Value Ref Range   Rapid Strep A Screen Negative  Negative  [facility] reading (PRIMARY) by  Dr.Daub there is possibly some thickening of the right ethmoid sinus    Assessment & Plan:  Patient recently completed a course of Augmentin. We'll treat with Silvadene air for 10 days. He is encouraged to do sinus rinses saline if he will do it. He has been resistant to using any nasal sprays.  I personally performed the services described in this documentation, which was scribed in my presence. The recorded information has been reviewed and is accurate.

## 2014-05-12 NOTE — Patient Instructions (Signed)

## 2014-05-14 LAB — CULTURE, GROUP A STREP: ORGANISM ID, BACTERIA: NORMAL

## 2014-11-11 ENCOUNTER — Ambulatory Visit (INDEPENDENT_AMBULATORY_CARE_PROVIDER_SITE_OTHER): Payer: 59 | Admitting: Physician Assistant

## 2014-11-11 VITALS — BP 104/78 | HR 74 | Temp 98.0°F | Resp 18 | Ht 71.5 in | Wt 216.0 lb

## 2014-11-11 DIAGNOSIS — R0981 Nasal congestion: Secondary | ICD-10-CM

## 2014-11-11 DIAGNOSIS — J029 Acute pharyngitis, unspecified: Secondary | ICD-10-CM

## 2014-11-11 DIAGNOSIS — R05 Cough: Secondary | ICD-10-CM

## 2014-11-11 DIAGNOSIS — R059 Cough, unspecified: Secondary | ICD-10-CM

## 2014-11-11 DIAGNOSIS — J069 Acute upper respiratory infection, unspecified: Secondary | ICD-10-CM

## 2014-11-11 LAB — POCT RAPID STREP A (OFFICE): Rapid Strep A Screen: NEGATIVE

## 2014-11-11 MED ORDER — CETIRIZINE-PSEUDOEPHEDRINE ER 5-120 MG PO TB12: 1.0000 | ORAL_TABLET | ORAL | Status: DC

## 2014-11-11 MED ORDER — HYDROCOD POLST-CHLORPHEN POLST 10-8 MG/5ML PO LQCR: 5.0000 mL | Freq: Two times a day (BID) | ORAL | Status: DC | PRN

## 2014-11-11 MED ORDER — IBUPROFEN 600 MG PO TABS: 600.0000 mg | ORAL_TABLET | Freq: Three times a day (TID) | ORAL | Status: DC | PRN

## 2014-11-11 NOTE — Progress Notes (Signed)
11/11/2014 at 9:06 AM  [place] / DOB: May 13, 1981 / MRN: [id number]  The patient has Dental caries on his problem list.  SUBJECTIVE  Chief compalaint: Headache; Nasal Congestion; and Chills   History of present illness: Mr. Privette is 34 y.o. well appearing male presenting for viral uri symptoms. This problem has been present for about 4 days and is getting worse. Associated symptoms include sore throat, sinus pressure, unilateral sinus left HA, and he denies chest tightness, SOB, nausea, fever, change in eating and drinking and vomitting. He has tried claritin with fair relief. He has not had the annual flu shot. He reports an allergy to hycodan, but has had hydrocodone in the past and tolerated it without difficulty.     He  has a past medical history of Allergy.    He  currently has no medications in their medication list.  Mr. Cho is allergic to hycodan. He  reports that he has never smoked. He does not have any smokeless tobacco history on file. He reports that he drinks alcohol. He reports that he does not use illicit drugs. He  reports that he currently engages in sexual activity.  The patient  has no past surgical history on file.  His family history includes Diabetes in his father and maternal grandmother.  ROS  Per HPI  OBJECTIVE  He  height is 5' 11.5" (1.816 m) and weight is 216 lb (97.977 kg). His oral temperature is 98 F (36.7 C). His blood pressure is 104/78 and his pulse is 74. His respiration is 18 and oxygen saturation is 98%.  The patient's body mass index is 29.71 kg/(m^2).  Physical Exam  Constitutional: He is oriented to person, place, and time. He appears well-developed and well-nourished.  HENT:  Right Ear: Hearing, tympanic membrane, external ear and ear canal normal.  Left Ear: Hearing, tympanic membrane, external ear and ear canal normal.  Cardiovascular: Normal rate, regular rhythm and normal heart sounds.  Exam reveals no gallop  and no friction rub.   No murmur heard. Respiratory: Effort normal and breath sounds normal. No respiratory distress. He has no wheezes. He has no rales. He exhibits no tenderness.  Musculoskeletal: Normal range of motion.  Neurological: He is alert and oriented to person, place, and time. No cranial nerve deficit.  Skin: Skin is warm and dry. No rash noted. No erythema. No pallor.  Psychiatric: He has a normal mood and affect. His behavior is normal. Thought content normal.    Results for orders placed or performed in visit on 11/11/14 (from the past 24 hour(s))  POCT rapid strep A     Status: None   Collection Time: 11/11/14  8:46 AM  Result Value Ref Range   Rapid Strep A Screen Negative Negative    ASSESSMENT & PLAN  Sami was seen today for headache, nasal congestion and chills.  Diagnoses and associated orders for this visit:  Viral URI: Vital signs and PE reassuring.  Will treat for viral etiology.  Patient advised to come back to clinic if he is not improved after 14 days of total illness, or if he worsens with the below.    Sore throat - POCT rapid strep A - Culture, Group A Strep - ibuprofen (ADVIL,MOTRIN) 600 MG tablet; Take 1 tablet (600 mg total) by mouth every 8 (eight) hours as needed.  Nasal congestion - cetirizine-pseudoephedrine (ZYRTEC-D) 5-120 MG per tablet; Take 1 tablet by mouth every morning.  Cough - chlorpheniramine-HYDROcodone (TUSSIONEX PENNKINETIC  ER) 10-8 MG/5ML LQCR; Take 5 mLs by mouth every 12 (twelve) hours as needed for cough (cough).     The patient was instructed to to call or comeback to clinic as needed, or should symptoms warrant.  Philis Fendt, MHS, PA-C [facility] 11/11/2014 9:06 AM

## 2014-11-11 NOTE — Progress Notes (Signed)
  Medical screening examination/treatment/procedure(s) were performed by non-physician practitioner and as supervising physician I was immediately available for consultation/collaboration.     

## 2014-11-13 LAB — CULTURE, GROUP A STREP: Organism ID, Bacteria: NORMAL

## 2014-12-24 ENCOUNTER — Ambulatory Visit (INDEPENDENT_AMBULATORY_CARE_PROVIDER_SITE_OTHER): Payer: 59 | Admitting: Family Medicine

## 2014-12-24 VITALS — BP 124/80 | HR 106 | Temp 98.2°F | Resp 17 | Ht 72.5 in | Wt 210.0 lb

## 2014-12-24 DIAGNOSIS — R6889 Other general symptoms and signs: Secondary | ICD-10-CM

## 2014-12-24 DIAGNOSIS — J101 Influenza due to other identified influenza virus with other respiratory manifestations: Secondary | ICD-10-CM | POA: Diagnosis not present

## 2014-12-24 LAB — POCT INFLUENZA A/B
INFLUENZA A, POC: POSITIVE
INFLUENZA B, POC: NEGATIVE

## 2014-12-24 NOTE — Progress Notes (Signed)
Subjective:    Patient ID: [place], male    DOB: 1980/12/29, 34 y.o.   MRN: [id number]  12/24/2014  Fever; Generalized Body Aches; Cough; and Nausea   HPI This 34 y.o. male presents for evaluation of body aches, cough, nausea.  Onset yesterday.  Onset with mild cough and developed into high fever 102.8 this morning.  +chills/sweats.  +body aches.  +HA.  No ear pain.  +ST; no pain with swallowing.  +rhinorrhea +nasal congestion. +coughing; +sputum yellowish green.  +post-tussive emesis.  No diarrhea.  No nausea.  No vomiting separate from cough.  Ate last night.  Taking Advil, Mucinex.  No flu vaccine.  [employer]/textile technologist.   Review of Systems  Constitutional: Positive for fever, chills, diaphoresis and fatigue.  HENT: Positive for congestion, rhinorrhea and sore throat. Negative for ear pain, trouble swallowing and voice change.   Respiratory: Positive for cough. Negative for shortness of breath and wheezing.   Gastrointestinal: Positive for vomiting. Negative for nausea.  Skin: Negative for rash.  Neurological: Positive for dizziness and headaches.    Past Medical History  Diagnosis Date  . Allergy    Past Surgical History  Procedure Laterality Date  . Laparoscopic appendectomy N/A 12/28/2014    Procedure: APPENDECTOMY LAPAROSCOPIC;  Surgeon: Armandina Gemma, MD;  Location: [facility];  Service: General;  Laterality: N/A;   Allergies  Allergen Reactions  . Dairy Aid [Lactase]   . Hycodan [Hydrocodone-Homatropine] Nausea And Vomiting    Tolerates hydrocodone with acetaminophen   Current Outpatient Prescriptions  Medication Sig Dispense Refill  . ibuprofen (ADVIL,MOTRIN) 200 MG tablet Take 200 mg by mouth every 6 (six) hours as needed.    . ciprofloxacin (CIPRO) 500 MG tablet Take 1 tablet (500 mg total) by mouth 2 (two) times daily. 14 tablet 0  . metroNIDAZOLE (FLAGYL) 500 MG tablet Take 1 tablet (500 mg total) by mouth 3 (three) times daily. 21 tablet 0  .  ondansetron (ZOFRAN ODT) 4 MG disintegrating tablet Take 1 tablet (4 mg total) by mouth every 8 (eight) hours as needed for nausea or vomiting. 10 tablet 0  . oxyCODONE (OXY IR/ROXICODONE) 5 MG immediate release tablet Take 1-2 tablets (5-10 mg total) by mouth every 4 (four) hours as needed for moderate pain, severe pain or breakthrough pain. 50 tablet 0  . PE-GG-APAP & PE-DPH-APAP LIQUID MISC Take 5 mLs by mouth every 6 (six) hours as needed (cough/congestion).     No current facility-administered medications for this visit.       Objective:    BP 124/80 mmHg  Pulse 106  Temp(Src) 98.2 F (36.8 C) (Oral)  Resp 17  Ht 6' 0.5" (1.842 m)  Wt 210 lb (95.255 kg)  BMI 28.07 kg/m2  SpO2 97% Physical Exam  Constitutional: He is oriented to person, place, and time. He appears well-developed and well-nourished. No distress.  HENT:  Head: Normocephalic and atraumatic.  Right Ear: Tympanic membrane, external ear and ear canal normal.  Left Ear: Tympanic membrane, external ear and ear canal normal.  Nose: Nose normal.  Mouth/Throat: Oropharynx is clear and moist. No oropharyngeal exudate.  Eyes: Conjunctivae and EOM are normal. Pupils are equal, round, and reactive to light.  Neck: Normal range of motion. Neck supple. Carotid bruit is not present. No thyromegaly present.  Cardiovascular: Normal rate, regular rhythm, normal heart sounds and intact distal pulses.  Exam reveals no gallop and no friction rub.   No murmur heard. Pulmonary/Chest: Effort normal and breath sounds  normal. He has no wheezes. He has no rales.  Lymphadenopathy:    He has no cervical adenopathy.  Neurological: He is alert and oriented to person, place, and time. No cranial nerve deficit.  Skin: Skin is warm and dry. No rash noted. He is not diaphoretic.  Psychiatric: He has a normal mood and affect. His behavior is normal.  Nursing note and vitals reviewed.  Results for orders placed or performed in visit on 12/24/14    POCT Influenza A/B  Result Value Ref Range   Influenza A, POC Positive    Influenza B, POC Negative        Assessment & Plan:   1. Flu-like symptoms   2. Influenza A     -New. -Supportive care with rest, fluids, Tylenol or Motrin scheduled. -Continue Mucinex. -No work while febrile. -RTC for acute worsening, SOB, extreme malaise/fatigue. -Low risk for complications of influenza A.   Meds ordered this encounter  Medications  . DISCONTD: guaiFENesin (MUCINEX) 600 MG 12 hr tablet    Sig: Take by mouth 2 (two) times daily.  Marland Kitchen ibuprofen (ADVIL,MOTRIN) 200 MG tablet    Sig: Take 200 mg by mouth every 6 (six) hours as needed.    No Follow-up on file.     Tangela Dolliver Elayne Guerin, M.D. [facility] [address] [place], [place]  [address] [phone] phone [phone] fax

## 2014-12-24 NOTE — Patient Instructions (Signed)

## 2014-12-26 ENCOUNTER — Emergency Department (HOSPITAL_COMMUNITY)
Admission: EM | Admit: 2014-12-26 | Discharge: 2014-12-26 | Disposition: A | Discharge status: Home / Self Care | Payer: 59 | Attending: Emergency Medicine | Admitting: Emergency Medicine

## 2014-12-26 ENCOUNTER — Encounter (HOSPITAL_COMMUNITY): Payer: Self-pay | Admitting: *Deleted

## 2014-12-26 ENCOUNTER — Emergency Department (HOSPITAL_COMMUNITY): Payer: 59

## 2014-12-26 DIAGNOSIS — Z79899 Other long term (current) drug therapy: Secondary | ICD-10-CM | POA: Diagnosis not present

## 2014-12-26 DIAGNOSIS — R042 Hemoptysis: Secondary | ICD-10-CM | POA: Insufficient documentation

## 2014-12-26 DIAGNOSIS — B349 Viral infection, unspecified: Secondary | ICD-10-CM | POA: Insufficient documentation

## 2014-12-26 DIAGNOSIS — R109 Unspecified abdominal pain: Secondary | ICD-10-CM | POA: Diagnosis present

## 2014-12-26 LAB — COMPREHENSIVE METABOLIC PANEL
ALT: 38 U/L (ref 0–53)
AST: 65 U/L — AB (ref 0–37)
Albumin: 4.1 g/dL (ref 3.5–5.2)
Alkaline Phosphatase: 53 U/L (ref 39–117)
Anion gap: 5 (ref 5–15)
BILIRUBIN TOTAL: 0.7 mg/dL (ref 0.3–1.2)
BUN: 9 mg/dL (ref 6–23)
CALCIUM: 8.7 mg/dL (ref 8.4–10.5)
CHLORIDE: 101 mmol/L (ref 96–112)
CO2: 27 mmol/L (ref 19–32)
CREATININE: 1.17 mg/dL (ref 0.50–1.35)
GFR calc Af Amer: >90 mL/min (ref >90)
GFR, EST NON AFRICAN AMERICAN: 81 mL/min — AB (ref >90)
GLUCOSE: 127 mg/dL — AB (ref 70–99)
POTASSIUM: 3.2 mmol/L — AB (ref 3.5–5.1)
Sodium: 133 mmol/L — ABNORMAL LOW (ref 135–145)
Total Protein: 6.8 g/dL (ref 6.0–8.3)

## 2014-12-26 LAB — CBC WITH DIFFERENTIAL/PLATELET
Basophils Absolute: 0 10*3/uL (ref 0.0–0.1)
Basophils Relative: 0 % (ref 0–1)
EOS ABS: 0 10*3/uL (ref 0.0–0.7)
Eosinophils Relative: 0 % (ref 0–5)
HEMATOCRIT: 44.1 % (ref 39.0–52.0)
Hemoglobin: 14.7 g/dL (ref 13.0–17.0)
Lymphocytes Relative: 15 % (ref 12–46)
Lymphs Abs: 1.2 10*3/uL (ref 0.7–4.0)
MCH: 27.8 pg (ref 26.0–34.0)
MCHC: 33.3 g/dL (ref 30.0–36.0)
MCV: 83.5 fL (ref 78.0–100.0)
Monocytes Absolute: 0.9 10*3/uL (ref 0.1–1.0)
Monocytes Relative: 11 % (ref 3–12)
NEUTROS PCT: 74 % (ref 43–77)
Neutro Abs: 6 10*3/uL (ref 1.7–7.7)
PLATELETS: 148 10*3/uL — AB (ref 150–400)
RBC: 5.28 MIL/uL (ref 4.22–5.81)
RDW: 13.1 % (ref 11.5–15.5)
WBC: 8 10*3/uL (ref 4.0–10.5)

## 2014-12-26 LAB — URINALYSIS, ROUTINE W REFLEX MICROSCOPIC
GLUCOSE, UA: NEGATIVE mg/dL
HGB URINE DIPSTICK: NEGATIVE
Ketones, ur: >80 mg/dL — AB
Leukocytes, UA: NEGATIVE
Nitrite: NEGATIVE
PH: 6.5 (ref 5.0–8.0)
Protein, ur: 100 mg/dL — AB
SPECIFIC GRAVITY, URINE: 1.031 — AB (ref 1.005–1.030)
Urobilinogen, UA: 1 mg/dL (ref 0.0–1.0)

## 2014-12-26 LAB — LIPASE, BLOOD: Lipase: 20 U/L (ref 11–59)

## 2014-12-26 LAB — URINE MICROSCOPIC-ADD ON

## 2014-12-26 MED ORDER — IOHEXOL 300 MG/ML  SOLN
50.0000 mL | Freq: Once | INTRAMUSCULAR | Status: AC | PRN
  Administered 2014-12-26: 50 mL via ORAL

## 2014-12-26 MED ORDER — SODIUM CHLORIDE 0.9 % IV BOLUS (SEPSIS)
1000.0000 mL | Freq: Once | INTRAVENOUS | Status: AC
  Administered 2014-12-26: 1000 mL via INTRAVENOUS

## 2014-12-26 MED ORDER — MORPHINE SULFATE 4 MG/ML IJ SOLN
4.0000 mg | Freq: Once | INTRAMUSCULAR | Status: AC
  Administered 2014-12-26: 4 mg via INTRAVENOUS
  Filled 2014-12-26: qty 1

## 2014-12-26 MED ORDER — FENTANYL CITRATE 0.05 MG/ML IJ SOLN
50.0000 ug | Freq: Once | INTRAMUSCULAR | Status: AC
  Administered 2014-12-26: 50 ug via NASAL
  Filled 2014-12-26: qty 2

## 2014-12-26 MED ORDER — ONDANSETRON HCL 4 MG/2ML IJ SOLN
4.0000 mg | Freq: Once | INTRAMUSCULAR | Status: AC
  Administered 2014-12-26: 4 mg via INTRAVENOUS
  Filled 2014-12-26: qty 2

## 2014-12-26 MED ORDER — OXYCODONE-ACETAMINOPHEN 5-325 MG PO TABS: 2.0000 | ORAL_TABLET | ORAL | Status: DC | PRN

## 2014-12-26 MED ORDER — KETOROLAC TROMETHAMINE 30 MG/ML IJ SOLN
30.0000 mg | Freq: Once | INTRAMUSCULAR | Status: AC
  Administered 2014-12-26: 30 mg via INTRAVENOUS
  Filled 2014-12-26: qty 1

## 2014-12-26 MED ORDER — IOHEXOL 300 MG/ML  SOLN
100.0000 mL | Freq: Once | INTRAMUSCULAR | Status: AC | PRN
  Administered 2014-12-26: 100 mL via INTRAVENOUS

## 2014-12-26 MED ORDER — ONDANSETRON 4 MG PO TBDP: 4.0000 mg | ORAL_TABLET | Freq: Three times a day (TID) | ORAL | Status: DC | PRN

## 2014-12-26 NOTE — ED Notes (Signed)
Pt reports abd pain with nausea since Tuesday.  Was dx with influenza at an UC.  Pt reports severe pain in his abd.  Pt also reports coughing up blood.

## 2014-12-26 NOTE — Discharge Instructions (Signed)
Take Percocet as needed for pain. Take zofran as needed for nausea. Refer to attached documents for more information.

## 2014-12-26 NOTE — ED Notes (Signed)
Pt notified that urine sample is ordered 

## 2014-12-26 NOTE — ED Provider Notes (Signed)
CSN: [id number]     Arrival date & time 12/26/14  0004 History   First MD Initiated Contact with Patient 12/26/14 0012     Chief Complaint  Patient presents with  . Abdominal Pain  . Nausea     (Consider location/radiation/quality/duration/timing/severity/associated sxs/prior Treatment) HPI Comments: Patient is a 34 year old male with no past medical history who presents with abdominal pain for the past day. The pain is located in his left abdomen and does not radiate. The pain is described as cramping and severe. The pain started gradually and progressively worsened since the onset. No alleviating/aggravating factors. The patient has tried nothing for symptoms without relief. Associated symptoms include NVD. Patient denies fever, headache, chest pain, SOB, dysuria, constipation. Patient reports being diagnosed with the flu 2 days ago at an urgent care.     Past Medical History  Diagnosis Date  . Allergy    History reviewed. No pertinent past surgical history. Family History  Problem Relation Age of Onset  . Diabetes Father   . Diabetes Maternal Grandmother    History  Substance Use Topics  . Smoking status: Never Smoker   . Smokeless tobacco: Not on file  . Alcohol Use: 0.0 oz/week     Comment: once a year    Review of Systems  Constitutional: Positive for fever. Negative for chills and fatigue.  HENT: Negative for trouble swallowing.   Eyes: Negative for visual disturbance.  Respiratory: Negative for shortness of breath.   Cardiovascular: Negative for chest pain and palpitations.  Gastrointestinal: Positive for nausea, vomiting and abdominal pain. Negative for diarrhea.  Genitourinary: Negative for dysuria and difficulty urinating.  Musculoskeletal: Negative for arthralgias and neck pain.  Skin: Negative for color change.  Neurological: Negative for dizziness and weakness.  Psychiatric/Behavioral: Negative for dysphoric mood.      Allergies  Dairy aid and  Hycodan  Home Medications   Prior to Admission medications   Medication Sig Start Date End Date Taking? Authorizing Provider  guaiFENesin (MUCINEX) 600 MG 12 hr tablet Take by mouth 2 (two) times daily.   Yes Historical Provider, MD  ibuprofen (ADVIL,MOTRIN) 200 MG tablet Take 200 mg by mouth every 6 (six) hours as needed.   Yes Historical Provider, MD   BP 122/79 mmHg  Pulse 79  Temp(Src) 99.6 F (37.6 C) (Oral)  Resp 18  SpO2 97% Physical Exam  Constitutional: He is oriented to person, place, and time. He appears well-developed and well-nourished. No distress.  HENT:  Head: Normocephalic and atraumatic.  Eyes: Conjunctivae are normal.  Neck: Normal range of motion.  Cardiovascular: Normal rate and regular rhythm.  Exam reveals no gallop and no friction rub.   No murmur heard. Pulmonary/Chest: Effort normal and breath sounds normal. He has no wheezes. He has no rales. He exhibits no tenderness.  Abdominal: Soft. He exhibits no distension. There is tenderness. There is no rebound.  LLQ tenderness to palpation. No other focal tenderness.   Musculoskeletal: Normal range of motion.  Neurological: He is alert and oriented to person, place, and time. Coordination normal.  Speech is goal-oriented. Moves limbs without ataxia.   Skin: Skin is warm and dry.  Psychiatric: He has a normal mood and affect. His behavior is normal.  Nursing note and vitals reviewed.   ED Course  Procedures (including critical care time) Labs Review Labs Reviewed  CBC WITH DIFFERENTIAL/PLATELET - Abnormal; Notable for the following:    Platelets 148 (*)    All other components within  normal limits  COMPREHENSIVE METABOLIC PANEL - Abnormal; Notable for the following:    Sodium 133 (*)    Potassium 3.2 (*)    Glucose, Bld 127 (*)    AST 65 (*)    GFR calc non Af Amer 81 (*)    All other components within normal limits  URINALYSIS, ROUTINE W REFLEX MICROSCOPIC - Abnormal; Notable for the following:     Color, Urine AMBER (*)    APPearance CLOUDY (*)    Specific Gravity, Urine 1.031 (*)    Bilirubin Urine SMALL (*)    Ketones, ur >80 (*)    Protein, ur 100 (*)    All other components within normal limits  URINE MICROSCOPIC-ADD ON - Abnormal; Notable for the following:    Casts HYALINE CASTS (*)    All other components within normal limits  LIPASE, BLOOD    Imaging Review Dg Chest 2 View  12/26/2014   CLINICAL DATA:  Fever, nausea and weakness  EXAM: CHEST  2 VIEW  COMPARISON:  None.  FINDINGS: The heart size and mediastinal contours are within normal limits. Both lungs are clear. The visualized skeletal structures are unremarkable.  IMPRESSION: No active cardiopulmonary disease.   Electronically Signed   By: Andreas Newport M.D.   On: 12/26/2014 01:56   Ct Abdomen Pelvis W Contrast  12/26/2014   CLINICAL DATA:  Abdominal pain and nausea  EXAM: CT ABDOMEN AND PELVIS WITH CONTRAST  TECHNIQUE: Multidetector CT imaging of the abdomen and pelvis was performed using the standard protocol following bolus administration of intravenous contrast.  CONTRAST:  138mL OMNIPAQUE IOHEXOL 300 MG/ML  SOLN  COMPARISON:  None.  FINDINGS: There are normal appearances of the liver, spleen, pancreas, adrenals and kidneys. Mesentery and bowel appear unremarkable. Abdominal aorta is normal in caliber. There is no adenopathy. There is no ascites. There is no inflammatory change in the abdomen or pelvis. There is a tiny fat containing umbilical hernia. No acute musculoskeletal abnormalities are evident. No significant abnormalities evident in the lower chest.  IMPRESSION: No significant abnormality.   Electronically Signed   By: Andreas Newport M.D.   On: 12/26/2014 04:19     EKG Interpretation None      MDM   Final diagnoses:  Hemoptysis  Viral illness    4:48 AM Labs, urinalysis, chest xray and CT abdomen pelvis unremarkable for acute changes. Vitals stable and patient afebrile. Patient likely has a  viral illness. Patient will be discharged with Percocet and zofran.     [address] [place], PA-C 12/26/14 [place], MD 12/26/14 [phone]

## 2014-12-28 ENCOUNTER — Emergency Department (HOSPITAL_COMMUNITY): Payer: 59 | Admitting: Anesthesiology

## 2014-12-28 ENCOUNTER — Ambulatory Visit (INDEPENDENT_AMBULATORY_CARE_PROVIDER_SITE_OTHER): Payer: 59 | Admitting: Family Medicine

## 2014-12-28 ENCOUNTER — Ambulatory Visit (INDEPENDENT_AMBULATORY_CARE_PROVIDER_SITE_OTHER): Payer: 59

## 2014-12-28 ENCOUNTER — Inpatient Hospital Stay (HOSPITAL_COMMUNITY)
Admission: EM | Admit: 2014-12-28 | Discharge: 2014-12-31 | DRG: 340 | Disposition: A | Discharge status: Home / Self Care | Payer: 59 | Attending: Surgery | Admitting: Surgery

## 2014-12-28 ENCOUNTER — Encounter (HOSPITAL_COMMUNITY): Payer: Self-pay | Admitting: *Deleted

## 2014-12-28 ENCOUNTER — Encounter (HOSPITAL_COMMUNITY): Admission: EM | Disposition: A | Discharge status: Home / Self Care | Payer: Self-pay | Source: Home / Self Care

## 2014-12-28 ENCOUNTER — Emergency Department (HOSPITAL_COMMUNITY): Payer: 59

## 2014-12-28 VITALS — BP 122/82 | HR 101 | Temp 100.9°F | Resp 20 | Ht 72.5 in | Wt 210.0 lb

## 2014-12-28 DIAGNOSIS — R1031 Right lower quadrant pain: Secondary | ICD-10-CM | POA: Diagnosis present

## 2014-12-28 DIAGNOSIS — Z885 Allergy status to narcotic agent status: Secondary | ICD-10-CM

## 2014-12-28 DIAGNOSIS — R509 Fever, unspecified: Secondary | ICD-10-CM

## 2014-12-28 DIAGNOSIS — R8290 Unspecified abnormal findings in urine: Secondary | ICD-10-CM | POA: Diagnosis not present

## 2014-12-28 DIAGNOSIS — Z833 Family history of diabetes mellitus: Secondary | ICD-10-CM | POA: Diagnosis not present

## 2014-12-28 DIAGNOSIS — Z888 Allergy status to other drugs, medicaments and biological substances status: Secondary | ICD-10-CM | POA: Diagnosis not present

## 2014-12-28 DIAGNOSIS — K3532 Acute appendicitis with perforation and localized peritonitis, without abscess: Secondary | ICD-10-CM | POA: Diagnosis present

## 2014-12-28 DIAGNOSIS — K353 Acute appendicitis with localized peritonitis: Secondary | ICD-10-CM | POA: Diagnosis present

## 2014-12-28 DIAGNOSIS — D72829 Elevated white blood cell count, unspecified: Secondary | ICD-10-CM | POA: Diagnosis not present

## 2014-12-28 DIAGNOSIS — J101 Influenza due to other identified influenza virus with other respiratory manifestations: Secondary | ICD-10-CM | POA: Diagnosis present

## 2014-12-28 DIAGNOSIS — K3580 Unspecified acute appendicitis: Secondary | ICD-10-CM

## 2014-12-28 DIAGNOSIS — R339 Retention of urine, unspecified: Secondary | ICD-10-CM | POA: Diagnosis present

## 2014-12-28 DIAGNOSIS — E876 Hypokalemia: Secondary | ICD-10-CM | POA: Diagnosis not present

## 2014-12-28 HISTORY — PX: LAPAROSCOPIC APPENDECTOMY: SHX408

## 2014-12-28 LAB — I-STAT CHEM 8, ED
BUN: 8 mg/dL (ref 6–23)
CALCIUM ION: 1.04 mmol/L — AB (ref 1.12–1.23)
Chloride: 96 mmol/L (ref 96–112)
Creatinine, Ser: 0.9 mg/dL (ref 0.50–1.35)
Glucose, Bld: 123 mg/dL — ABNORMAL HIGH (ref 70–99)
HCT: 47 % (ref 39.0–52.0)
HEMOGLOBIN: 16 g/dL (ref 13.0–17.0)
Potassium: 3.4 mmol/L — ABNORMAL LOW (ref 3.5–5.1)
Sodium: 137 mmol/L (ref 135–145)
TCO2: 26 mmol/L (ref 0–100)

## 2014-12-28 LAB — POCT UA - MICROSCOPIC ONLY: Yeast, UA: NEGATIVE

## 2014-12-28 LAB — POCT CBC
Granulocyte percent: 84.9 %G — AB (ref 37–80)
HCT, POC: 43.9 % (ref 43.5–53.7)
Hemoglobin: 14.4 g/dL (ref 14.1–18.1)
Lymph, poc: 0.9 (ref 0.6–3.4)
MCH, POC: 27.4 pg (ref 27–31.2)
MCHC: 32.8 g/dL (ref 31.8–35.4)
MCV: 83.6 fL (ref 80–97)
MID (cbc): 0.9 (ref 0–0.9)
MPV: 8.8 fL (ref 0–99.8)
POC Granulocyte: 9.8 — AB (ref 2–6.9)
POC LYMPH PERCENT: 7.7 %L — AB (ref 10–50)
POC MID %: 7.4 %M (ref 0–12)
Platelet Count, POC: 178 10*3/uL (ref 142–424)
RBC: 5.25 M/uL (ref 4.69–6.13)
RDW, POC: 13.3 %
WBC: 11.6 10*3/uL — AB (ref 4.6–10.2)

## 2014-12-28 LAB — POCT URINALYSIS DIPSTICK
Blood, UA: NEGATIVE
Glucose, UA: NEGATIVE
Ketones, UA: 15
Leukocytes, UA: NEGATIVE
Nitrite, UA: NEGATIVE
Protein, UA: 100
Spec Grav, UA: >=1.03
Urobilinogen, UA: 1
pH, UA: 6

## 2014-12-28 SURGERY — APPENDECTOMY, LAPAROSCOPIC
Anesthesia: General

## 2014-12-28 MED ORDER — ONDANSETRON HCL 4 MG/2ML IJ SOLN
INTRAMUSCULAR | Status: DC | PRN
  Administered 2014-12-28: 4 mg via INTRAVENOUS

## 2014-12-28 MED ORDER — SUCCINYLCHOLINE CHLORIDE 20 MG/ML IJ SOLN
INTRAMUSCULAR | Status: DC | PRN
  Administered 2014-12-28: 100 mg via INTRAVENOUS

## 2014-12-28 MED ORDER — PROPOFOL 10 MG/ML IV BOLUS
INTRAVENOUS | Status: AC
  Filled 2014-12-28: qty 20

## 2014-12-28 MED ORDER — FENTANYL CITRATE 0.05 MG/ML IJ SOLN
INTRAMUSCULAR | Status: DC | PRN
  Administered 2014-12-28 (×2): 50 ug via INTRAVENOUS
  Administered 2014-12-28: 100 ug via INTRAVENOUS

## 2014-12-28 MED ORDER — HYDROMORPHONE HCL 1 MG/ML IJ SOLN
1.0000 mg | Freq: Once | INTRAMUSCULAR | Status: AC
  Administered 2014-12-28: 1 mg via INTRAVENOUS
  Filled 2014-12-28: qty 1

## 2014-12-28 MED ORDER — SODIUM CHLORIDE 0.9 % IV SOLN
Freq: Once | INTRAVENOUS | Status: AC
  Administered 2014-12-28: 17:00:00 via INTRAVENOUS

## 2014-12-28 MED ORDER — MIDAZOLAM HCL 2 MG/2ML IJ SOLN
INTRAMUSCULAR | Status: AC
  Filled 2014-12-28: qty 2

## 2014-12-28 MED ORDER — NEOSTIGMINE METHYLSULFATE 10 MG/10ML IV SOLN
INTRAVENOUS | Status: DC | PRN
  Administered 2014-12-28: 3 mg via INTRAVENOUS

## 2014-12-28 MED ORDER — ACETAMINOPHEN 10 MG/ML IV SOLN
1000.0000 mg | Freq: Once | INTRAVENOUS | Status: AC
  Administered 2014-12-29: 1000 mg via INTRAVENOUS
  Filled 2014-12-28: qty 100

## 2014-12-28 MED ORDER — SODIUM CHLORIDE 0.9 % IR SOLN
Status: DC | PRN
  Administered 2014-12-28: 3000 mL

## 2014-12-28 MED ORDER — GLYCOPYRROLATE 0.2 MG/ML IJ SOLN
INTRAMUSCULAR | Status: DC | PRN
  Administered 2014-12-28: 0.4 mg via INTRAVENOUS

## 2014-12-28 MED ORDER — ONDANSETRON HCL 4 MG/2ML IJ SOLN
INTRAMUSCULAR | Status: AC
  Filled 2014-12-28: qty 2

## 2014-12-28 MED ORDER — LIDOCAINE HCL (PF) 2 % IJ SOLN
INTRAMUSCULAR | Status: DC | PRN
  Administered 2014-12-28: 40 mg via INTRADERMAL

## 2014-12-28 MED ORDER — BUPIVACAINE-EPINEPHRINE 0.25% -1:200000 IJ SOLN
INTRAMUSCULAR | Status: AC
  Filled 2014-12-28: qty 1

## 2014-12-28 MED ORDER — LACTATED RINGERS IV SOLN: INTRAVENOUS | Status: DC

## 2014-12-28 MED ORDER — LACTATED RINGERS IV SOLN
INTRAVENOUS | Status: DC | PRN
  Administered 2014-12-28: 21:00:00 via INTRAVENOUS

## 2014-12-28 MED ORDER — DEXAMETHASONE SODIUM PHOSPHATE 10 MG/ML IJ SOLN
INTRAMUSCULAR | Status: AC
  Filled 2014-12-28: qty 1

## 2014-12-28 MED ORDER — IOHEXOL 300 MG/ML  SOLN
100.0000 mL | Freq: Once | INTRAMUSCULAR | Status: AC | PRN
  Administered 2014-12-28: 100 mL via INTRAVENOUS

## 2014-12-28 MED ORDER — SODIUM CHLORIDE 0.9 % IV SOLN
3.0000 g | Freq: Four times a day (QID) | INTRAVENOUS | Status: DC
  Administered 2014-12-28: 3 g via INTRAVENOUS
  Filled 2014-12-28 (×2): qty 3

## 2014-12-28 MED ORDER — HYDROMORPHONE HCL 1 MG/ML IJ SOLN
0.2500 mg | INTRAMUSCULAR | Status: DC | PRN
  Administered 2014-12-29: 0.5 mg via INTRAVENOUS

## 2014-12-28 MED ORDER — IOHEXOL 300 MG/ML  SOLN
50.0000 mL | Freq: Once | INTRAMUSCULAR | Status: AC | PRN
  Administered 2014-12-28: 50 mL via ORAL

## 2014-12-28 MED ORDER — FENTANYL CITRATE 0.05 MG/ML IJ SOLN
INTRAMUSCULAR | Status: AC
  Filled 2014-12-28: qty 2

## 2014-12-28 MED ORDER — KCL IN DEXTROSE-NACL 20-5-0.45 MEQ/L-%-% IV SOLN
INTRAVENOUS | Status: DC
  Administered 2014-12-29 (×2): via INTRAVENOUS
  Filled 2014-12-28 (×3): qty 1000

## 2014-12-28 MED ORDER — ROCURONIUM BROMIDE 100 MG/10ML IV SOLN
INTRAVENOUS | Status: DC | PRN
  Administered 2014-12-28: 20 mg via INTRAVENOUS
  Administered 2014-12-28: 10 mg via INTRAVENOUS

## 2014-12-28 MED ORDER — SODIUM CHLORIDE 0.9 % IV SOLN
3.0000 g | Freq: Once | INTRAVENOUS | Status: AC
  Administered 2014-12-28: 3 g via INTRAVENOUS
  Filled 2014-12-28: qty 3

## 2014-12-28 MED ORDER — PROPOFOL 10 MG/ML IV BOLUS
INTRAVENOUS | Status: DC | PRN
  Administered 2014-12-28: 150 mg via INTRAVENOUS

## 2014-12-28 MED ORDER — DEXAMETHASONE SODIUM PHOSPHATE 10 MG/ML IJ SOLN
INTRAMUSCULAR | Status: DC | PRN
  Administered 2014-12-28: 10 mg via INTRAVENOUS

## 2014-12-28 MED ORDER — GLYCOPYRROLATE 0.2 MG/ML IJ SOLN
INTRAMUSCULAR | Status: AC
  Filled 2014-12-28: qty 2

## 2014-12-28 MED ORDER — MIDAZOLAM HCL 5 MG/5ML IJ SOLN
INTRAMUSCULAR | Status: DC | PRN
  Administered 2014-12-28: 2 mg via INTRAVENOUS

## 2014-12-28 MED ORDER — BUPIVACAINE-EPINEPHRINE 0.25% -1:200000 IJ SOLN
INTRAMUSCULAR | Status: DC | PRN
  Administered 2014-12-28: 50 mL

## 2014-12-28 MED ORDER — SODIUM CHLORIDE 0.9 % IV BOLUS (SEPSIS)
1000.0000 mL | Freq: Once | INTRAVENOUS | Status: AC
  Administered 2014-12-28: 1000 mL via INTRAVENOUS

## 2014-12-28 MED ORDER — SODIUM CHLORIDE 0.9 % IV SOLN: 3.0000 g | Freq: Four times a day (QID) | INTRAVENOUS | Status: DC

## 2014-12-28 SURGICAL SUPPLY — 44 items
APL SKNCLS STERI-STRIP NONHPOA (GAUZE/BANDAGES/DRESSINGS) ×1
APPLIER CLIP ROT 10 11.4 M/L (STAPLE)
APR CLP MED LRG 11.4X10 (STAPLE)
BAG SPEC RTRVL LRG 6X4 10 (ENDOMECHANICALS)
BENZOIN TINCTURE PRP APPL 2/3 (GAUZE/BANDAGES/DRESSINGS) ×2 IMPLANT
CLIP APPLIE ROT 10 11.4 M/L (STAPLE) IMPLANT
CUTTER FLEX LINEAR 45M (STAPLE) ×1 IMPLANT
DECANTER SPIKE VIAL GLASS SM (MISCELLANEOUS) ×2 IMPLANT
DRAIN CHANNEL 19F RND (DRAIN) ×1 IMPLANT
DRAPE LAPAROSCOPIC ABDOMINAL (DRAPES) ×2 IMPLANT
ELECT REM PT RETURN 9FT ADLT (ELECTROSURGICAL) ×2
ELECTRODE REM PT RTRN 9FT ADLT (ELECTROSURGICAL) ×1 IMPLANT
ENDOLOOP SUT PDS II  0 18 (SUTURE)
ENDOLOOP SUT PDS II 0 18 (SUTURE) IMPLANT
EVACUATOR SILICONE 100CC (DRAIN) ×1 IMPLANT
GAUZE SPONGE 2X2 8PLY STRL LF (GAUZE/BANDAGES/DRESSINGS) IMPLANT
GLOVE BIOGEL PI IND STRL 7.0 (GLOVE) ×1 IMPLANT
GLOVE BIOGEL PI INDICATOR 7.0 (GLOVE) ×1
GLOVE SURG ORTHO 8.0 STRL STRW (GLOVE) ×2 IMPLANT
GOWN STRL REUS W/TWL LRG LVL3 (GOWN DISPOSABLE) ×2 IMPLANT
GOWN STRL REUS W/TWL XL LVL3 (GOWN DISPOSABLE) ×4 IMPLANT
KIT BASIN OR (CUSTOM PROCEDURE TRAY) ×2 IMPLANT
PENCIL BUTTON HOLSTER BLD 10FT (ELECTRODE) IMPLANT
POUCH SPECIMEN RETRIEVAL 10MM (ENDOMECHANICALS) IMPLANT
RELOAD 45 VASCULAR/THIN (ENDOMECHANICALS) IMPLANT
RELOAD STAPLE 45 2.5 WHT GRN (ENDOMECHANICALS) IMPLANT
RELOAD STAPLE 45 3.5 BLU ETS (ENDOMECHANICALS) IMPLANT
RELOAD STAPLE TA45 3.5 REG BLU (ENDOMECHANICALS) ×2 IMPLANT
SET IRRIG TUBING LAPAROSCOPIC (IRRIGATION / IRRIGATOR) IMPLANT
SHEARS HARMONIC ACE PLUS 36CM (ENDOMECHANICALS) IMPLANT
SOLUTION ANTI FOG 6CC (MISCELLANEOUS) ×2 IMPLANT
SPONGE GAUZE 2X2 STER 10/PKG (GAUZE/BANDAGES/DRESSINGS) ×1
STRIP CLOSURE SKIN 1/2X4 (GAUZE/BANDAGES/DRESSINGS) ×2 IMPLANT
SUT ETHILON 3 0 PS 1 (SUTURE) ×1 IMPLANT
SUT MNCRL AB 4-0 PS2 18 (SUTURE) ×2 IMPLANT
TAPE CLOTH SURG 6X10 WHT LF (GAUZE/BANDAGES/DRESSINGS) ×1 IMPLANT
TOWEL OR 17X26 10 PK STRL BLUE (TOWEL DISPOSABLE) ×2 IMPLANT
TRAY FOLEY CATH 14FRSI W/METER (CATHETERS) ×2 IMPLANT
TRAY LAPAROSCOPIC (CUSTOM PROCEDURE TRAY) ×2 IMPLANT
TROCAR BLADELESS OPT 5 75 (ENDOMECHANICALS) ×2 IMPLANT
TROCAR XCEL BLUNT TIP 100MML (ENDOMECHANICALS) ×2 IMPLANT
TROCAR XCEL NON-BLD 11X100MML (ENDOMECHANICALS) ×2 IMPLANT
TUBING INSUFFLATION 10FT LAP (TUBING) ×2 IMPLANT
WATER STERILE IRR 1500ML POUR (IV SOLUTION) ×2 IMPLANT

## 2014-12-28 NOTE — ED Provider Notes (Signed)
CSN: [id number]     Arrival date & time 12/28/14  1256 History   First MD Initiated Contact with Patient 12/28/14 1320     Chief Complaint  Patient presents with  . Abdominal Pain      HPI Patient presents to the emergency department with complaints of ongoing abdominal pain and fever.  Patient was seen in the emergency department 2 days ago for abdominal pain at that time had laboratory studies including a CT scan which demonstrated no acute pathology.  Patient states that initially he began feeling better and that his pain began shifting towards the right.  He had fever at home as well as nausea.  He was seen in the urgent care today with since the emergency department for possible appendicitis given his new and worsening right lower quadrant abdominal pain.  Patient reports the pain is been constant.  His pain is moderate in severity.  No urinary complaints.  Her back pain or flank pain.  No chest pain or shortness of breath.  No cough or congestion.  No dysuria or urinary frequency.   Past Medical History  Diagnosis Date  . Allergy    History reviewed. No pertinent past surgical history. Family History  Problem Relation Age of Onset  . Diabetes Father   . Diabetes Maternal Grandmother    History  Substance Use Topics  . Smoking status: Never Smoker   . Smokeless tobacco: Not on file  . Alcohol Use: 0.0 oz/week     Comment: once a year    Review of Systems  All other systems reviewed and are negative.     Allergies  Dairy aid and Hycodan  Home Medications   Prior to Admission medications   Medication Sig Start Date End Date Taking? Authorizing Provider  ibuprofen (ADVIL,MOTRIN) 200 MG tablet Take 200 mg by mouth every 6 (six) hours as needed.   Yes Historical Provider, MD  ondansetron (ZOFRAN ODT) 4 MG disintegrating tablet Take 1 tablet (4 mg total) by mouth every 8 (eight) hours as needed for nausea or vomiting. 12/26/14  Yes Alvina Chou, PA-C   oxyCODONE-acetaminophen (PERCOCET/ROXICET) 5-325 MG per tablet Take 2 tablets by mouth every 4 (four) hours as needed for severe pain. 12/26/14  Yes Kaitlyn Szekalski, PA-C  PE-GG-APAP & PE-DPH-APAP LIQUID MISC Take 5 mLs by mouth every 6 (six) hours as needed (cough/congestion).   Yes Historical Provider, MD   BP 124/76 mmHg  Pulse 97  Temp(Src) 99.1 F (37.3 C) (Oral)  Resp 16  SpO2 95% Physical Exam  Constitutional: He is oriented to person, place, and time. He appears well-developed and well-nourished.  HENT:  Head: Normocephalic and atraumatic.  Eyes: EOM are normal.  Neck: Normal range of motion.  Cardiovascular: Normal rate, regular rhythm, normal heart sounds and intact distal pulses.   Pulmonary/Chest: Effort normal and breath sounds normal. No respiratory distress.  Abdominal: Soft. He exhibits no distension.  Tenderness right lower quadrant without guarding or rebound.  Musculoskeletal: Normal range of motion.  Neurological: He is alert and oriented to person, place, and time.  Skin: Skin is warm and dry.  Psychiatric: He has a normal mood and affect. Judgment normal.  Nursing note and vitals reviewed.   ED Course  Procedures (including critical care time) Labs Review Labs Reviewed  I-STAT CHEM 8, ED - Abnormal; Notable for the following:    Potassium 3.4 (*)    Glucose, Bld 123 (*)    Calcium, Ion 1.04 (*)  All other components within normal limits    Imaging Review     EKG Interpretation None      MDM   Final diagnoses:  None    Concerning for appendicitis given ongoing fever and now focal tenderness in right lower quadrant.  Repeat CT scan pending at this time.  Pain treated.  Nothing by mouth.  4:14 PM Care to Dr Ashok Cordia to follow up on CT imaging results    Jola Schmidt, MD 12/28/14 [phone]

## 2014-12-28 NOTE — Op Note (Signed)
OPERATIVE REPORT - LAPAROSCOPIC APPENDECTOMY  Preop diagnosis: Acute appendicitis  Postop diagnosis: acute gangrenous appendicitis with perforation and fecal peritonitis  Procedure: Laparoscopic appendectomy  Surgeon:  Earnstine Regal, MD, FACS  Anesthesia: General endotracheal  Estimated blood loss: Minimal  Preparation: Chlora-prep  Complications: None  Indications:  Patient is a 34 yo WM with 4 day hx of a viral illness. Seen in ER on 3/17 with left sided abd pain. CT scan negative. Sent home. Developed RLQ abd pain shortly thereafter. Fever, sweats, diarrhea. Now returns to ER with elevated WBC. CT scan of abdomen positive for acute appendicitis with possible early perforation.  Procedure:  Patient is brought to the operating room and placed in a supine position on the operating room table. Following administration of general anesthesia, a time out was held and the patient's name and procedure is confirmed. Patient is then prepped and draped in the usual strict aseptic fashion.  After ascertaining that an adequate level of anesthesia has been achieved, a peri-umbilical incision is made with a #15 blade. Dissection is carried down to the fascia. Fascia is incised in the midline and the peritoneal cavity is entered cautiously. A #0-vicryl pursestring suture is placed in the fascia. An Hassan cannula is introduced under direct vision and secured with the pursestring suture. The abdomen is insufflated with carbon dioxide. The laparoscope is introduced and the abdomen is explored.  There is an acute inflammatory process involving the right lower quadrant of the abdomen. There is still fibrinous exudate present on the bowel and the peritoneal surfaces. There is fecal peritonitis in the right lower quadrant and purulent fluid. Operative ports are placed in the right upper quadrant and left lower quadrant. The appendix is identified. Omentum is mobilized off of the cecum and the appendix.  The mesoappendix is divided with the harmonic scalpel. The midportion of the appendix is gangrenous and obviously perforated. Dissection is carried down to the base of the appendix. The base of the appendix is dissected out clearing the junction with the cecal wall. Using an Endo-GIA stapler, the base of the appendix is transected at the junction with the cecal wall. There is good approximation of tissue along the staple line. There is good hemostasis along the staple line. The appendix is placed into an endo-catch bag and withdrawn through the umbilical port. The #0-vicryl pursestring suture is tied securely.  Right lower quadrant is irrigated with 4 L of warm saline which is evacuated. Good hemostasis is noted. A 19 [place] Blake drain is placed in the pelvis and along the right colic gutter. It exits through the right upper quadrant port site and is secured to the skin with a 3-0 nylon suture. Ports are removed under direct vision. Good hemostasis is noted at the port sites. Pneumoperitoneum is released.  Skin incisions are anesthetized with local anesthetic. Wounds are closed with interrupted 4-0 Monocryl subcuticular sutures. Wounds are washed and dried and benzoin and Steri-Strips are applied. Dressings are applied. The patient is awakened from anesthesia and brought to the recovery room. The patient tolerated the procedure well.  Earnstine Regal, MD, [facility] Office: [phone]

## 2014-12-28 NOTE — Patient Instructions (Addendum)
GO TO ER AT [facility], COLITIS, LESS LIKELY OBSTRUCTION  Abdominal Pain Many things can cause abdominal pain. Usually, abdominal pain is not caused by a disease and will improve without treatment. It can often be observed and treated at home. Your health care provider will do a physical exam and possibly order blood tests and X-rays to help determine the seriousness of your pain. However, in many cases, more time must pass before a clear cause of the pain can be found. Before that point, your health care provider may not know if you need more testing or further treatment. HOME CARE INSTRUCTIONS  Monitor your abdominal pain for any changes. The following actions may help to alleviate any discomfort you are experiencing:  Only take over-the-counter or prescription medicines as directed by your health care provider.  Do not take laxatives unless directed to do so by your health care provider.  Try a clear liquid diet (broth, tea, or water) as directed by your health care provider. Slowly move to a bland diet as tolerated. SEEK MEDICAL CARE IF:  You have unexplained abdominal pain.  You have abdominal pain associated with nausea or diarrhea.  You have pain when you urinate or have a bowel movement.  You experience abdominal pain that wakes you in the night.  You have abdominal pain that is worsened or improved by eating food.  You have abdominal pain that is worsened with eating fatty foods.  You have a fever. SEEK IMMEDIATE MEDICAL CARE IF:   Your pain does not go away within 2 hours.  You keep throwing up (vomiting).  Your pain is felt only in portions of the abdomen, such as the right side or the left lower portion of the abdomen.  You pass bloody or black tarry stools. MAKE SURE YOU:  Understand these instructions.   Will watch your condition.   Will get help right away if you are not doing well or get worse.  Document Released: 07/06/2005  Document Revised: 10/01/2013 Document Reviewed: 06/05/2013 [facility] Patient Information 2015 [place], [place]. This information is not intended to replace advice given to you by your health care provider. Make sure you discuss any questions you have with your health care provider.

## 2014-12-28 NOTE — H&P (Signed)
Bobby Hobbs is an 34 y.o. male.    General Surgery [facility]  Chief Complaint: abdominal pain, acute appendicitis  HPI:  Patient is a 34 yo WM with 4 day hx of a viral illness.  Seen in ER on 3/17 with left sided abd pain.  CT scan negative.  Sent home.  Developed RLQ abd pain shortly thereafter.  Fever, sweats, diarrhea.  Now returns to ER with elevated WBC.  CT scan of abdomen positive for acute appendicitis with possible early perforation.  No prior abdominal surgery.  No prescription meds.  Accompanied by wife who has the flu.  Past Medical History  Diagnosis Date  . Allergy     History reviewed. No pertinent past surgical history.  Family History  Problem Relation Age of Onset  . Diabetes Father   . Diabetes Maternal Grandmother    Social History:  reports that he has never smoked. He does not have any smokeless tobacco history on file. He reports that he drinks alcohol. He reports that he does not use illicit drugs.  Allergies:  Allergies  Allergen Reactions  . Dairy Aid [Lactase]   . Hycodan [Hydrocodone-Homatropine] Nausea And Vomiting    Tolerates hydrocodone with acetaminophen     (Not in a hospital admission)  Results for orders placed or performed during the hospital encounter of 12/28/14 (from the past 48 hour(s))  I-Stat Chem 8, ED     Status: Abnormal   Collection Time: 12/28/14  1:59 PM  Result Value Ref Range   Sodium 137 135 - 145 mmol/L   Potassium 3.4 (L) 3.5 - 5.1 mmol/L   Chloride 96 96 - 112 mmol/L   BUN 8 6 - 23 mg/dL   Creatinine, Ser 0.90 0.50 - 1.35 mg/dL   Glucose, Bld 123 (H) 70 - 99 mg/dL   Calcium, Ion 1.04 (L) 1.12 - 1.23 mmol/L   TCO2 26 0 - 100 mmol/L   Hemoglobin 16.0 13.0 - 17.0 g/dL   HCT 47.0 39.0 - 52.0 %   Dg Abd 1 View  12/28/2014   CLINICAL DATA:  Right lower quadrant abdominal pain R10.31 (ICD-10-CM)Abnormal urine normal bowel gas pattern.Fever, unspecified fever cause R50.9 (ICD-10-CM)   EXAM: ABDOMEN - 1 VIEW  COMPARISON:  None.  FINDINGS: Normal bowel gas pattern.  No evidence of obstruction.  No evidence of renal or ureteral stones. Soft tissues are unremarkable.  No significant bony abnormality.  IMPRESSION: Negative.   Electronically Signed   By: Lajean Manes M.D.   On: 12/28/2014 12:13   Ct Abdomen Pelvis W Contrast  12/28/2014   CLINICAL DATA:  Right abdominal pain, nausea/vomiting, re-evaluate for acute appendicitis  EXAM: CT ABDOMEN AND PELVIS WITH CONTRAST  TECHNIQUE: Multidetector CT imaging of the abdomen and pelvis was performed using the standard protocol following bolus administration of intravenous contrast.  CONTRAST:  164mL OMNIPAQUE IOHEXOL 300 MG/ML  SOLN  COMPARISON:  CT abdomen pelvis dated 12/26/2014  FINDINGS: Lower chest:  Lung bases are clear.  Hepatobiliary: Liver is within normal limits.  Gallbladder is unremarkable. No intrahepatic or extrahepatic ductal dilatation.  Pancreas: Within normal limits.  Spleen: Within normal limits.  Adrenals/Urinary Tract: Adrenal glands are unremarkable.  Kidneys are within normal limits.  No hydronephrosis.  Bladder is underdistended but unremarkable.  Stomach/Bowel: Stomach is within normal limits.  No evidence of bowel obstruction.  Abnormal appendix, reflecting acute appendicitis, with surrounding periappendiceal inflammatory changes. Degree of surrounding periappendiceal stranding/fluid raises the possibility of localized  perforation.  No drainable fluid collection/abscess. Tiny focus of adjacent periappendiceal gas (series 2/image 61). No free air.  Secondary inflammatory changes involving a loop of distal ileum (series 2/ image 64) as well as the cecum (series 2/image 55).  Vascular/Lymphatic: No evidence of abdominal aortic aneurysm.  No suspicious abdominopelvic lymphadenopathy.  Reproductive: Prostate is unremarkable.  Other: Small volume pelvic ascites. Additional fluid in the right mid abdomen/right paracolic gutter.   Musculoskeletal: Visualized osseous structures are within normal limits.  IMPRESSION: Acute appendicitis.  Adjacent periappendiceal fluid/stranding raises the possibility of localized perforation. No drainable fluid collection/abscess.  Secondary inflammatory changes involving distal ileum and cecum.  Small volume pelvic ascites.   Electronically Signed   By: Julian Hy M.D.   On: 12/28/2014 16:41    Review of Systems  Constitutional: Positive for fever, chills and diaphoresis.  HENT: Negative.   Eyes: Negative.   Respiratory: Negative.   Cardiovascular: Negative.   Gastrointestinal: Positive for abdominal pain (RLQ) and diarrhea. Negative for nausea and vomiting.  Genitourinary: Negative.   Musculoskeletal: Negative.   Skin: Negative.   Neurological: Negative.   Endo/Heme/Allergies: Negative.   Psychiatric/Behavioral: Negative.     Blood pressure 121/75, pulse 89, temperature 103 F (39.4 C), temperature source Oral, resp. rate 16, SpO2 96 %. Physical Exam   Assessment/Plan Acute appendicitis  Admit to general surgery service  Begin IV Unasyn  Plan appendectomy later this evening  Discussed procedure with patient and wife.  Discussed possible conversion to open procedure.  Discussed hospital stay.  They understand.  The risks and benefits of the procedure have been discussed at length with the patient.  The patient understands the proposed procedure, potential alternative treatments, and the course of recovery to be expected.  All of the patient's questions have been answered at this time.  The patient wishes to proceed with surgery.  Earnstine Regal, MD, [facility] Office: [place] 12/28/2014, 5:31 PM

## 2014-12-28 NOTE — Progress Notes (Signed)
 Chief Complaint:  Chief Complaint  Patient presents with  . Abdominal Pain    RLQ pain--was at ED on 12/26/14  . Fever  . Emesis  . Chills    HPI: Bobby Hobbs is a 34 y.o. male who is here for lower quadrant abdominal pain, with movement and without movement, he was seen in the ER and was evaluated for left lower quadrant abdominal pain and was given Percocet. Has 8-9/10 without and 5-6/10 with  percocet . He continues to have pain even with a little bit of movement. He states he may have sprained a muscle but is not sure since he is had fevers,  [name] was seen here for influenza a and was treated with guaifenesin and Motrin. Untreated with the Tamiflu and 12/24/14.  He then developed worsening symptoms and went to the emergency room for left-sided lower quadrant abdominal pain chest x-ray CT scan of the abdomen and charged home with Roxicet and Zofran on 12/26/14.  He now has worsening right lower quadrant abdominal pain. He has been able to drink fluids but has poor appetite for is to have a low-grade fever. Is worried about possible appendicitis. Has had some loose stools. His stools have been normal to loose, nonbloody diarrhea 5-6 episodes in the last 2 days. He continues to have some nausea with some vomiting. He denies any history of colitis, Crohn's, inflammatory bowel disease or colon cancer. He took 2 Percocets prior to coming into the office. ,  Past Medical History  Diagnosis Date  . Allergy    No past surgical history on file. History   Social History  . Marital Status: Married    Spouse Name: N/A  . Number of Children: N/A  . Years of Education: N/A   Social History Main Topics  . Smoking status: Never Smoker   . Smokeless tobacco: Not on file  . Alcohol Use: 0.0 oz/week     Comment: once a year  . Drug Use: No  . Sexual Activity: Yes   Other Topics Concern  . None   Social History Narrative   Family History  Problem Relation Age of  Onset  . Diabetes Father   . Diabetes Maternal Grandmother    Allergies  Allergen Reactions  . Dairy Aid [Lactase]   . Hycodan [Hydrocodone-Homatropine] Nausea And Vomiting    Tolerates hydrocodone with acetaminophen   Prior to Admission medications   Medication Sig Start Date End Date Taking? Authorizing Provider  guaiFENesin (MUCINEX) 600 MG 12 hr tablet Take by mouth 2 (two) times daily.   Yes Historical Provider, MD  ibuprofen (ADVIL,MOTRIN) 200 MG tablet Take 200 mg by mouth every 6 (six) hours as needed.   Yes Historical Provider, MD  ondansetron (ZOFRAN ODT) 4 MG disintegrating tablet Take 1 tablet (4 mg total) by mouth every 8 (eight) hours as needed for nausea or vomiting. 12/26/14  Yes Alvina Chou, PA-C  oxyCODONE-acetaminophen (PERCOCET/ROXICET) 5-325 MG per tablet Take 2 tablets by mouth every 4 (four) hours as needed for severe pain. 12/26/14  Yes Kaitlyn Szekalski, PA-C     ROS: The patient denies  night sweats, unintentional weight loss, chest pain, palpitations, wheezing, dyspnea on exertion,  dysuria, hematuria, melena, numbness, weakness, or tingling.   All other systems have been reviewed and were otherwise negative with the exception of those mentioned in the HPI and as above.    PHYSICAL EXAM: Filed Vitals:   12/28/14 1038  BP: 122/82  Pulse:  101  Temp: 100.9 F (38.3 C)  Resp: 20   Filed Vitals:   12/28/14 1038  Height: 6' 0.5" (1.842 m)  Weight: 210 lb (95.255 kg)   Body mass index is 28.07 kg/(m^2).  General: Alert, mild to moderate acute distress HEENT:  Normocephalic, atraumatic, oropharynx patent. EOMI, PERRLA Cardiovascular:  Regular rate and rhythm, no rubs murmurs or gallops.  No Carotid bruits, radial pulse intact. No pedal edema.  Respiratory: Clear to auscultation bilaterally.  No wheezes, rales, or rhonchi.  No cyanosis, no use of accessory musculature GI: No organomegaly, abdomen is soft, positive bowel sounds.  No masses. + RLQ  tenderness, guarding Skin: No rashes. Neurologic: Facial musculature symmetric. Psychiatric: Patient is appropriate throughout our interaction. Lymphatic: No cervical lymphadenopathy Musculoskeletal: Gait intact.   LABS: Results for orders placed or performed in visit on 12/28/14  POCT CBC  Result Value Ref Range   WBC 11.6 (A) 4.6 - 10.2 K/uL   Lymph, poc 0.9 0.6 - 3.4   POC LYMPH PERCENT 7.7 (A) 10 - 50 %L   MID (cbc) 0.9 0 - 0.9   POC MID % 7.4 0 - 12 %M   POC Granulocyte 9.8 (A) 2 - 6.9   Granulocyte percent 84.9 (A) 37 - 80 %G   RBC 5.25 4.69 - 6.13 M/uL   Hemoglobin 14.4 14.1 - 18.1 g/dL   HCT, POC 43.9 43.5 - 53.7 %   MCV 83.6 80 - 97 fL   MCH, POC 27.4 27 - 31.2 pg   MCHC 32.8 31.8 - 35.4 g/dL   RDW, POC 13.3 %   Platelet Count, POC 178 142 - 424 K/uL   MPV 8.8 0 - 99.8 fL  POCT urinalysis dipstick  Result Value Ref Range   Color, UA amber    Clarity, UA turbid    Glucose, UA neg    Bilirubin, UA small    Ketones, UA 15    Spec Grav, UA >=1.030    Blood, UA neg    pH, UA 6.0    Protein, UA 100    Urobilinogen, UA 1.0    Nitrite, UA neg    Leukocytes, UA Negative   POCT UA - Microscopic Only  Result Value Ref Range   WBC, Ur, HPF, POC 5-8    RBC, urine, microscopic 1-3    Bacteria, U Microscopic trace    Mucus, UA 3+    Epithelial cells, urine per micros 4-6    Crystals, Ur, HPF, POC ammonium biurate    Casts, Ur, LPF, POC hyaline and granular    Yeast, UA neg      EKG/XRAY:   Primary read interpreted by Dr. Marin Comment at [facility]. Dilated lops of bowel on RLQ Please comment if there is obstruction   ASSESSMENT/PLAN: Encounter Diagnoses  Name Primary?  . Right lower quadrant abdominal pain Yes  . Abnormal urine   . Fever, unspecified fever cause   . Leukocytosis    Pleasant 34 year old healthy white male with a recent history of influenza A which was not treated with Tamiflu dx on 12/24/14, who presents with a recurrence of abdominal pain. It was  initially on the left lower quadrant and he was seen in the ER at [facility] on March 18 and was discharged since CT scan, chest x-ray, CBC and UA were all within normal limits. With worsening 8 out of 10 abdominal pain which is now on the right lower quadrant, leukocytosis, abd  guarding and rebound I  will send him to the ER for further evaluation. There is some mild dilatation of the right lower loops of bowel and tickening, ? Obstruction .  I do not suspect this is a urinary tract infection since UA is normal and also there were was no hematuria. Given 1 L of fluids and did not feel better.  Urine culture pending [facility] triage notified We'll send to [facility] ER for further evaluation to rule out appendicitis  vs  Colitis vs less likely obstruction due to dilated loops of bowel.    Gross sideeffects, risk and benefits, and alternatives of medications d/w patient. Patient is aware that all medications have potential sideeffects and we are unable to predict every sideeffect or drug-drug interaction that may occur.  , Niland, DO 12/28/2014 12:57 PM

## 2014-12-28 NOTE — ED Notes (Signed)
Tech used wheelchair to transport pt to bathroom

## 2014-12-28 NOTE — ED Notes (Signed)
Raquel Sarna with OR has called to inform that OR is ready for patient.

## 2014-12-28 NOTE — Anesthesia Procedure Notes (Signed)
Procedure Name: Intubation Date/Time: 12/28/2014 9:39 PM Performed by: Lajuana Carry E Pre-anesthesia Checklist: Patient identified, Emergency Drugs available, Suction available and Patient being monitored Patient Re-evaluated:Patient Re-evaluated prior to inductionOxygen Delivery Method: Circle System Utilized Preoxygenation: Pre-oxygenation with 100% oxygen Intubation Type: IV induction, Cricoid Pressure applied and Rapid sequence Ventilation: Mask ventilation without difficulty Laryngoscope Size: Mac and 3 Grade View: Grade II Tube type: Oral Tube size: 7.0 mm Number of attempts: 1 Airway Equipment and Method: Stylet and Oral airway Placement Confirmation: ETT inserted through vocal cords under direct vision,  positive ETCO2 and breath sounds checked- equal and bilateral Secured at: 22 cm Tube secured with: Tape Dental Injury: Teeth and Oropharynx as per pre-operative assessment

## 2014-12-28 NOTE — Anesthesia Preprocedure Evaluation (Addendum)
Anesthesia Evaluation  Patient identified by MRN, date of birth, ID band Patient awake    Reviewed: Allergy & Precautions, H&P , Patient's Chart, lab work & pertinent test results, reviewed documented beta blocker date and time   Airway Mallampati: II  TM Distance: >3 FB Neck ROM: full    Dental no notable dental hx.    Pulmonary  breath sounds clear to auscultation  Pulmonary exam normal       Cardiovascular Rhythm:regular Rate:Normal     Neuro/Psych    GI/Hepatic   Endo/Other    Renal/GU      Musculoskeletal   Abdominal   Peds  Hematology   Anesthesia Other Findings Recent flu-like sx; chest clear, poor dentition. Dental advisory emphasized.  Reproductive/Obstetrics                            Anesthesia Physical Anesthesia Plan  ASA: II and emergent  Anesthesia Plan: General   Post-op Pain Management:    Induction: Intravenous, Rapid sequence and Cricoid pressure planned  Airway Management Planned: Oral ETT  Additional Equipment:   Intra-op Plan:   Post-operative Plan: Extubation in OR  Informed Consent: I have reviewed the patients History and Physical, chart, labs and discussed the procedure including the risks, benefits and alternatives for the proposed anesthesia with the patient or authorized representative who has indicated his/her understanding and acceptance.   Dental Advisory Given and Dental advisory given  Plan Discussed with: CRNA and Surgeon  Anesthesia Plan Comments: (  Discussed general anesthesia, including possible nausea, instrumentation of airway, sore throat,pulmonary aspiration, etc. I asked if the were any outstanding questions, or  concerns before we proceeded. )        Anesthesia Quick Evaluation

## 2014-12-28 NOTE — ED Notes (Signed)
Pt alert and oriented x4. Respirations even and unlabored, bilateral symmetrical rise and fall of chest. Skin warm and dry. In no acute distress. Denies needs.   

## 2014-12-28 NOTE — ED Notes (Signed)
Pt was sent from UC for eval for appendicitis.Sts only things that helps is percocet.

## 2014-12-28 NOTE — Transfer of Care (Signed)
Immediate Anesthesia Transfer of Care Note  Patient: Bobby Hobbs  Procedure(s) Performed: Procedure(s) (LRB): APPENDECTOMY LAPAROSCOPIC (N/A)  Patient Location: PACU  Anesthesia Type: General  Level of Consciousness: sedated, patient cooperative and responds to stimulation  Airway & Oxygen Therapy: Patient Spontanous Breathing and Patient connected to face mask oxgen  Post-op Assessment: Report given to PACU RN and Post -op Vital signs reviewed and stable  Post vital signs: Reviewed and stable  Complications: No apparent anesthesia complications

## 2014-12-28 NOTE — Anesthesia Postprocedure Evaluation (Signed)
  Anesthesia Post-op Note  Patient: Bobby Hobbs  Procedure(s) Performed: Procedure(s): APPENDECTOMY LAPAROSCOPIC (N/A) Patient is awake and responsive. Pain and nausea are reasonably well controlled. Vital signs are stable and clinically acceptable. Oxygen saturation is clinically acceptable. There are no apparent anesthetic complications at this time. Patient is ready for discharge.

## 2014-12-29 ENCOUNTER — Encounter (HOSPITAL_COMMUNITY): Payer: Self-pay | Admitting: Surgery

## 2014-12-29 LAB — URINE CULTURE: Colony Count: 8000

## 2014-12-29 MED ORDER — HYDROMORPHONE HCL 1 MG/ML IJ SOLN
1.0000 mg | INTRAMUSCULAR | Status: DC | PRN
Start: 2014-12-29 — End: 2014-12-31
  Administered 2014-12-29: 1 mg via INTRAVENOUS
  Filled 2014-12-29: qty 1

## 2014-12-29 MED ORDER — HYDROMORPHONE HCL 1 MG/ML IJ SOLN
INTRAMUSCULAR | Status: AC
  Filled 2014-12-29: qty 1

## 2014-12-29 MED ORDER — KETOROLAC TROMETHAMINE 15 MG/ML IJ SOLN
15.0000 mg | Freq: Four times a day (QID) | INTRAMUSCULAR | Status: AC
  Administered 2014-12-29 – 2014-12-30 (×4): 15 mg via INTRAVENOUS
  Filled 2014-12-29 (×4): qty 1

## 2014-12-29 MED ORDER — HYDROCODONE-ACETAMINOPHEN 5-325 MG PO TABS
1.0000 | ORAL_TABLET | ORAL | Status: DC | PRN
  Administered 2014-12-29: 2 via ORAL
  Administered 2014-12-29 (×2): 1 via ORAL
  Filled 2014-12-29: qty 2
  Filled 2014-12-29 (×2): qty 1

## 2014-12-29 MED ORDER — IBUPROFEN 200 MG PO TABS: 600.0000 mg | ORAL_TABLET | Freq: Four times a day (QID) | ORAL | Status: DC | PRN

## 2014-12-29 MED ORDER — ONDANSETRON HCL 4 MG/2ML IJ SOLN: 4.0000 mg | Freq: Four times a day (QID) | INTRAMUSCULAR | Status: DC | PRN

## 2014-12-29 MED ORDER — SODIUM CHLORIDE 0.9 % IV SOLN
3.0000 g | Freq: Four times a day (QID) | INTRAVENOUS | Status: DC
  Administered 2014-12-29 – 2014-12-30 (×5): 3 g via INTRAVENOUS
  Filled 2014-12-29 (×6): qty 3

## 2014-12-29 MED ORDER — KCL IN DEXTROSE-NACL 20-5-0.45 MEQ/L-%-% IV SOLN
INTRAVENOUS | Status: AC
Start: 2014-12-29 — End: 2014-12-29
  Filled 2014-12-29: qty 1000

## 2014-12-29 MED ORDER — ENOXAPARIN SODIUM 40 MG/0.4ML ~~LOC~~ SOLN
40.0000 mg | SUBCUTANEOUS | Status: DC
  Administered 2014-12-29 – 2014-12-31 (×3): 40 mg via SUBCUTANEOUS
  Filled 2014-12-29 (×3): qty 0.4

## 2014-12-29 MED ORDER — ONDANSETRON HCL 4 MG PO TABS: 4.0000 mg | ORAL_TABLET | Freq: Four times a day (QID) | ORAL | Status: DC | PRN

## 2014-12-29 NOTE — Progress Notes (Signed)
Patient ID: [place], male   DOB: 04/28/1981, 34 y.o.   MRN: [id number]     [facility]      [address] [place], [place] [address]    Phone: [phone] FAX: [phone]     Subjective: Pain.  No flatus.  No n/v.  Tolerating clears.  Has not voided since before surgery.  VSS.  Afebrile.  Sat up at the end of bed, not mobilized yet.   Objective:  Vital signs:  Filed Vitals:   12/29/14 0034 12/29/14 0141 12/29/14 0247 12/29/14 0539  BP: 113/66 115/66 103/66 124/74  Pulse: 81 88 87 93  Temp: 97.7 F (36.5 C) 99 F (37.2 C) 98.6 F (37 C) 98.2 F (36.8 C)  TempSrc:  Oral Oral Oral  Resp: 18 20 17 16   SpO2: 94% 94% 95% 92%    Last BM Date: 12/28/14  Intake/Output   Yesterday:  03/20 0701 - 03/21 0700 In: 2260 [P.O.:360; I.V.:1900] Out: 250 [Drains:250] This shift:    I/O last 3 completed shifts: In: 2260 [P.O.:360; I.V.:1900] Out: 250 [Drains:250]    Physical Exam: General: Pt awake/alert/oriented x4 in no acute distress Chest: cta.  No chest wall pain w good excursion CV:  Pulses intact.  Regular rhythm MS: Normal AROM mjr joints.  No obvious deformity Abdomen: Soft.  +bs. distended.   Mildly tender at incisions only. incisions are c/d/i.   No evidence of peritonitis.  No incarcerated hernias. Ext:  SCDs BLE.  No mjr edema.  No cyanosis Skin: No petechiae / purpura   Problem List:   Principal Problem:   Acute gangrenous appendicitis with perforation and peritonitis    Results:   Labs: Results for orders placed or performed during the hospital encounter of 12/28/14 (from the past 48 hour(s))  I-Stat Chem 8, ED     Status: Abnormal   Collection Time: 12/28/14  1:59 PM  Result Value Ref Range   Sodium 137 135 - 145 mmol/L   Potassium 3.4 (L) 3.5 - 5.1 mmol/L   Chloride 96 96 - 112 mmol/L   BUN 8 6 - 23 mg/dL   Creatinine, Ser 0.90 0.50 - 1.35 mg/dL   Glucose, Bld 123 (H) 70 - 99 mg/dL    Calcium, Ion 1.04 (L) 1.12 - 1.23 mmol/L   TCO2 26 0 - 100 mmol/L   Hemoglobin 16.0 13.0 - 17.0 g/dL   HCT 47.0 39.0 - 52.0 %    Imaging / Studies: Dg Abd 1 View  12/28/2014   CLINICAL DATA:  Right lower quadrant abdominal pain R10.31 (ICD-10-CM)Abnormal urine normal bowel gas pattern.Fever, unspecified fever cause R50.9 (ICD-10-CM)  EXAM: ABDOMEN - 1 VIEW  COMPARISON:  None.  FINDINGS: Normal bowel gas pattern.  No evidence of obstruction.  No evidence of renal or ureteral stones. Soft tissues are unremarkable.  No significant bony abnormality.  IMPRESSION: Negative.   Electronically Signed   By: Lajean Manes M.D.   On: 12/28/2014 12:13   Ct Abdomen Pelvis W Contrast  12/28/2014   CLINICAL DATA:  Right abdominal pain, nausea/vomiting, re-evaluate for acute appendicitis  EXAM: CT ABDOMEN AND PELVIS WITH CONTRAST  TECHNIQUE: Multidetector CT imaging of the abdomen and pelvis was performed using the standard protocol following bolus administration of intravenous contrast.  CONTRAST:  141mL OMNIPAQUE IOHEXOL 300 MG/ML  SOLN  COMPARISON:  CT abdomen pelvis dated 12/26/2014  FINDINGS: Lower chest:  Lung bases are clear.  Hepatobiliary: Liver is within  normal limits.  Gallbladder is unremarkable. No intrahepatic or extrahepatic ductal dilatation.  Pancreas: Within normal limits.  Spleen: Within normal limits.  Adrenals/Urinary Tract: Adrenal glands are unremarkable.  Kidneys are within normal limits.  No hydronephrosis.  Bladder is underdistended but unremarkable.  Stomach/Bowel: Stomach is within normal limits.  No evidence of bowel obstruction.  Abnormal appendix, reflecting acute appendicitis, with surrounding periappendiceal inflammatory changes. Degree of surrounding periappendiceal stranding/fluid raises the possibility of localized perforation.  No drainable fluid collection/abscess. Tiny focus of adjacent periappendiceal gas (series 2/image 61). No free air.  Secondary inflammatory changes involving a  loop of distal ileum (series 2/ image 64) as well as the cecum (series 2/image 55).  Vascular/Lymphatic: No evidence of abdominal aortic aneurysm.  No suspicious abdominopelvic lymphadenopathy.  Reproductive: Prostate is unremarkable.  Other: Small volume pelvic ascites. Additional fluid in the right mid abdomen/right paracolic gutter.  Musculoskeletal: Visualized osseous structures are within normal limits.  IMPRESSION: Acute appendicitis.  Adjacent periappendiceal fluid/stranding raises the possibility of localized perforation. No drainable fluid collection/abscess.  Secondary inflammatory changes involving distal ileum and cecum.  Small volume pelvic ascites.   Electronically Signed   By: Julian Hy M.D.   On: 12/28/2014 16:41    Medications / Allergies:  Scheduled Meds: . ampicillin-sulbactam (UNASYN) IV  3 g Intravenous Q6H  . dextrose 5 % and 0.45 % NaCl with KCl 20 mEq/L      . HYDROmorphone      . ketorolac  15 mg Intravenous 4 times per day   Continuous Infusions: . dextrose 5 % and 0.45 % NaCl with KCl 20 mEq/L 75 mL/hr at 12/29/14 0036   PRN Meds:.HYDROcodone-acetaminophen, HYDROmorphone (DILAUDID) injection, ondansetron **OR** ondansetron (ZOFRAN) IV  Antibiotics: Anti-infectives    Start     Dose/Rate Route Frequency Ordered Stop   12/29/14 0600  Ampicillin-Sulbactam (UNASYN) 3 g in sodium chloride 0.9 % 100 mL IVPB     3 g 100 mL/hr over 60 Minutes Intravenous Every 6 hours 12/29/14 0125     12/29/14 0000  Ampicillin-Sulbactam (UNASYN) 3 g in sodium chloride 0.9 % 100 mL IVPB  Status:  Discontinued     3 g 100 mL/hr over 60 Minutes Intravenous Every 6 hours 12/28/14 1850 12/29/14 0130   12/28/14 1745  Ampicillin-Sulbactam (UNASYN) 3 g in sodium chloride 0.9 % 100 mL IVPB  Status:  Discontinued     3 g 100 mL/hr over 60 Minutes Intravenous Every 6 hours 12/28/14 1737 12/28/14 1850   12/28/14 1700  Ampicillin-Sulbactam (UNASYN) 3 g in sodium chloride 0.9 % 100 mL IVPB      3 g 100 mL/hr over 60 Minutes Intravenous  Once 12/28/14 1646 12/28/14 1847        Assessment/Plan acute gangrenous appendicitis with perforation and fecal peritonitis POD#1 laparoscopic appendectomy---Dr. Harlow Asa - 12/28/2014 -obtain a bladder scan, call with results -continue with clears, will advance as bowel function returns -IVF -pain control--IV dilaudid, add toradol x24h.  Will transition to PO  -mobilize -IS -SCD, add lovenox -continue drain  -continue with Unasyn(3-5d IV) then 7-10 PO  Diagnosed Influenza A last week  On isolation   Emina Riebock, ANP-BC [facility] Pager [phone](7A-4:30P)   12/29/2014 8:54 AM   Agree with above. Mother in room. Needs to move more.  Does not have much of an appetite.  Alphonsa Overall, MD, [facility] Pager: [phone] Office phone:  [phone]

## 2014-12-30 LAB — BASIC METABOLIC PANEL
Anion gap: 11 (ref 5–15)
BUN: 13 mg/dL (ref 6–23)
CO2: 26 mmol/L (ref 19–32)
Calcium: 8.1 mg/dL — ABNORMAL LOW (ref 8.4–10.5)
Chloride: 98 mmol/L (ref 96–112)
Creatinine, Ser: 0.83 mg/dL (ref 0.50–1.35)
GFR calc Af Amer: >90 mL/min (ref >90)
GLUCOSE: 158 mg/dL — AB (ref 70–99)
POTASSIUM: 3.1 mmol/L — AB (ref 3.5–5.1)
Sodium: 135 mmol/L (ref 135–145)

## 2014-12-30 LAB — CBC
HEMATOCRIT: 39.7 % (ref 39.0–52.0)
Hemoglobin: 13.1 g/dL (ref 13.0–17.0)
MCH: 27.2 pg (ref 26.0–34.0)
MCHC: 33 g/dL (ref 30.0–36.0)
MCV: 82.5 fL (ref 78.0–100.0)
Platelets: 202 10*3/uL (ref 150–400)
RBC: 4.81 MIL/uL (ref 4.22–5.81)
RDW: 13.2 % (ref 11.5–15.5)
WBC: 9.6 10*3/uL (ref 4.0–10.5)

## 2014-12-30 MED ORDER — METRONIDAZOLE IN NACL 5-0.79 MG/ML-% IV SOLN
500.0000 mg | Freq: Four times a day (QID) | INTRAVENOUS | Status: DC
  Administered 2014-12-30 – 2014-12-31 (×5): 500 mg via INTRAVENOUS
  Filled 2014-12-30 (×6): qty 100

## 2014-12-30 MED ORDER — SODIUM CHLORIDE 0.9 % IJ SOLN: 3.0000 mL | INTRAMUSCULAR | Status: DC | PRN

## 2014-12-30 MED ORDER — METHOCARBAMOL 1000 MG/10ML IJ SOLN: 1000.0000 mg | Freq: Four times a day (QID) | INTRAVENOUS | Status: DC | PRN

## 2014-12-30 MED ORDER — LIP MEDEX EX OINT
1.0000 "application " | TOPICAL_OINTMENT | Freq: Two times a day (BID) | CUTANEOUS | Status: DC
  Administered 2014-12-30 – 2014-12-31 (×2): 1 via TOPICAL
  Filled 2014-12-30 (×2): qty 7

## 2014-12-30 MED ORDER — METOPROLOL TARTRATE 1 MG/ML IV SOLN
5.0000 mg | Freq: Four times a day (QID) | INTRAVENOUS | Status: DC | PRN
  Filled 2014-12-30: qty 5

## 2014-12-30 MED ORDER — BISACODYL 10 MG RE SUPP: 10.0000 mg | Freq: Two times a day (BID) | RECTAL | Status: DC | PRN

## 2014-12-30 MED ORDER — KETOROLAC TROMETHAMINE 15 MG/ML IJ SOLN: 15.0000 mg | Freq: Four times a day (QID) | INTRAMUSCULAR | Status: DC

## 2014-12-30 MED ORDER — MENTHOL 3 MG MT LOZG: 1.0000 | LOZENGE | OROMUCOSAL | Status: DC | PRN

## 2014-12-30 MED ORDER — METHOCARBAMOL 500 MG PO TABS: 1000.0000 mg | ORAL_TABLET | Freq: Four times a day (QID) | ORAL | Status: DC | PRN

## 2014-12-30 MED ORDER — MAGIC MOUTHWASH: 15.0000 mL | Freq: Four times a day (QID) | ORAL | Status: DC | PRN

## 2014-12-30 MED ORDER — OXYCODONE HCL 5 MG PO TABS
5.0000 mg | ORAL_TABLET | ORAL | Status: DC | PRN
Start: 2014-12-30 — End: 2014-12-31
  Administered 2014-12-31: 5 mg via ORAL
  Filled 2014-12-30: qty 1

## 2014-12-30 MED ORDER — ACETAMINOPHEN 650 MG RE SUPP: 650.0000 mg | Freq: Four times a day (QID) | RECTAL | Status: DC | PRN

## 2014-12-30 MED ORDER — PROMETHAZINE HCL 25 MG/ML IJ SOLN: 6.2500 mg | INTRAMUSCULAR | Status: DC | PRN

## 2014-12-30 MED ORDER — ALUM & MAG HYDROXIDE-SIMETH 200-200-20 MG/5ML PO SUSP: 30.0000 mL | Freq: Four times a day (QID) | ORAL | Status: DC | PRN

## 2014-12-30 MED ORDER — POTASSIUM CHLORIDE CRYS ER 20 MEQ PO TBCR
40.0000 meq | EXTENDED_RELEASE_TABLET | Freq: Three times a day (TID) | ORAL | Status: AC
  Administered 2014-12-30 (×2): 40 meq via ORAL
  Filled 2014-12-30 (×4): qty 2

## 2014-12-30 MED ORDER — POLYETHYLENE GLYCOL 3350 17 G PO PACK: 17.0000 g | PACK | Freq: Two times a day (BID) | ORAL | Status: DC | PRN

## 2014-12-30 MED ORDER — PHENOL 1.4 % MT LIQD: 2.0000 | OROMUCOSAL | Status: DC | PRN

## 2014-12-30 MED ORDER — NAPROXEN 500 MG PO TABS
500.0000 mg | ORAL_TABLET | Freq: Two times a day (BID) | ORAL | Status: DC
  Administered 2014-12-30 – 2014-12-31 (×2): 500 mg via ORAL
  Filled 2014-12-30 (×5): qty 1

## 2014-12-30 MED ORDER — SODIUM CHLORIDE 0.9 % IV SOLN: 250.0000 mL | INTRAVENOUS | Status: DC | PRN

## 2014-12-30 MED ORDER — OXYCODONE-ACETAMINOPHEN 5-325 MG PO TABS
1.0000 | ORAL_TABLET | ORAL | Status: DC | PRN
  Administered 2014-12-30: 1 via ORAL
  Filled 2014-12-30: qty 1

## 2014-12-30 MED ORDER — LACTATED RINGERS IV BOLUS (SEPSIS): 1000.0000 mL | Freq: Three times a day (TID) | INTRAVENOUS | Status: DC | PRN

## 2014-12-30 MED ORDER — DIPHENHYDRAMINE HCL 50 MG/ML IJ SOLN: 12.5000 mg | Freq: Four times a day (QID) | INTRAMUSCULAR | Status: DC | PRN

## 2014-12-30 MED ORDER — SODIUM CHLORIDE 0.9 % IJ SOLN: 3.0000 mL | Freq: Two times a day (BID) | INTRAMUSCULAR | Status: DC

## 2014-12-30 MED ORDER — CEFTRIAXONE SODIUM IN DEXTROSE 40 MG/ML IV SOLN
2.0000 g | INTRAVENOUS | Status: DC
  Administered 2014-12-30 – 2014-12-31 (×2): 2 g via INTRAVENOUS
  Filled 2014-12-30 (×2): qty 50

## 2014-12-30 MED ORDER — SODIUM CHLORIDE 0.9 % IJ SOLN
3.0000 mL | Freq: Two times a day (BID) | INTRAMUSCULAR | Status: DC
  Administered 2014-12-30: 3 mL via INTRAVENOUS

## 2014-12-30 MED ORDER — ACETAMINOPHEN 325 MG PO TABS: 325.0000 mg | ORAL_TABLET | Freq: Four times a day (QID) | ORAL | Status: DC | PRN

## 2014-12-30 MED ORDER — KCL IN DEXTROSE-NACL 20-5-0.45 MEQ/L-%-% IV SOLN
INTRAVENOUS | Status: DC
  Administered 2014-12-30: 23:00:00 via INTRAVENOUS
  Filled 2014-12-30 (×2): qty 1000

## 2014-12-30 NOTE — Progress Notes (Signed)
Patient ID: [place], male   DOB: 1980/11/28, 34 y.o.   MRN: [id number]     [facility]      [address] [place], [place] [address]    Phone: [phone] FAX: [phone]     Subjective: Pain a bit better.  Had a BM.  VSS.  Afebrile.  Normal white count.    Objective:  Vital signs:  Filed Vitals:   12/29/14 1415 12/29/14 2222 12/30/14 0146 12/30/14 0634  BP: 118/66 123/73 99/63 103/66  Pulse: 71 73 76 71  Temp: 98.2 F (36.8 C) 98.3 F (36.8 C) 97.8 F (36.6 C) 98.4 F (36.9 C)  TempSrc: Oral Oral Oral Oral  Resp: '18 18 18 28  ' SpO2: 90% 93% 96% 93%    Last BM Date: 12/29/14  Intake/Output   Yesterday:  03/21 0701 - 03/22 0700 In: 2615 [P.O.:1080; I.V.:1435; IV Piggyback:100] Out: 1761 [Urine:1300; Drains:62] This shift:    I/O last 3 completed shifts: In: 3875 [P.O.:1440; I.V.:2335; IV Piggyback:100] Out: 6073 [Urine:1300; Drains:312]     Physical Exam: General: Pt awake/alert/oriented x4 in no acute distress Chest: cta. No chest wall pain w good excursion CV: Pulses intact. Regular rhythm MS: Normal AROM mjr joints. No obvious deformity Abdomen: Soft. +bs. distended. Mildly tender at incisions only. incisions are c/d/i.RLQ drain with purulent output.  No evidence of peritonitis. No incarcerated hernias. Ext: SCDs BLE. No mjr edema. No cyanosis Skin: No petechiae / purpura     Problem List:   Principal Problem:   Acute gangrenous appendicitis with perforation s/p lap appy 12/28/2014    Results:   Labs: Results for orders placed or performed during the hospital encounter of 12/28/14 (from the past 48 hour(s))  I-Stat Chem 8, ED     Status: Abnormal   Collection Time: 12/28/14  1:59 PM  Result Value Ref Range   Sodium 137 135 - 145 mmol/L   Potassium 3.4 (L) 3.5 - 5.1 mmol/L   Chloride 96 96 - 112 mmol/L   BUN 8 6 - 23 mg/dL   Creatinine, Ser 0.90 0.50 - 1.35 mg/dL    Glucose, Bld 123 (H) 70 - 99 mg/dL   Calcium, Ion 1.04 (L) 1.12 - 1.23 mmol/L   TCO2 26 0 - 100 mmol/L   Hemoglobin 16.0 13.0 - 17.0 g/dL   HCT 47.0 39.0 - 52.0 %  CBC     Status: None   Collection Time: 12/30/14  5:03 AM  Result Value Ref Range   WBC 9.6 4.0 - 10.5 K/uL   RBC 4.81 4.22 - 5.81 MIL/uL   Hemoglobin 13.1 13.0 - 17.0 g/dL    Comment: DELTA CHECK NOTED I STAT CHM 8 PANEL    HCT 39.7 39.0 - 52.0 %   MCV 82.5 78.0 - 100.0 fL   MCH 27.2 26.0 - 34.0 pg   MCHC 33.0 30.0 - 36.0 g/dL   RDW 13.2 11.5 - 15.5 %   Platelets 202 150 - 400 K/uL  Basic metabolic panel     Status: Abnormal   Collection Time: 12/30/14  5:03 AM  Result Value Ref Range   Sodium 135 135 - 145 mmol/L   Potassium 3.1 (L) 3.5 - 5.1 mmol/L   Chloride 98 96 - 112 mmol/L   CO2 26 19 - 32 mmol/L   Glucose, Bld 158 (H) 70 - 99 mg/dL   BUN 13 6 - 23 mg/dL   Creatinine, Ser 0.83 0.50 -  1.35 mg/dL   Calcium 8.1 (L) 8.4 - 10.5 mg/dL   GFR calc non Af Amer >90 >90 mL/min   GFR calc Af Amer >90 >90 mL/min    Comment: (NOTE) The eGFR has been calculated using the CKD EPI equation. This calculation has not been validated in all clinical situations. eGFR's persistently <90 mL/min signify possible Chronic Kidney Disease.    Anion gap 11 5 - 15    Imaging / Studies: Dg Abd 1 View  12/28/2014   CLINICAL DATA:  Right lower quadrant abdominal pain R10.31 (ICD-10-CM)Abnormal urine normal bowel gas pattern.Fever, unspecified fever cause R50.9 (ICD-10-CM)  EXAM: ABDOMEN - 1 VIEW  COMPARISON:  None.  FINDINGS: Normal bowel gas pattern.  No evidence of obstruction.  No evidence of renal or ureteral stones. Soft tissues are unremarkable.  No significant bony abnormality.  IMPRESSION: Negative.   Electronically Signed   By: Lajean Manes M.D.   On: 12/28/2014 12:13   Ct Abdomen Pelvis W Contrast  12/28/2014   CLINICAL DATA:  Right abdominal pain, nausea/vomiting, re-evaluate for acute appendicitis  EXAM: CT ABDOMEN AND  PELVIS WITH CONTRAST  TECHNIQUE: Multidetector CT imaging of the abdomen and pelvis was performed using the standard protocol following bolus administration of intravenous contrast.  CONTRAST:  114m OMNIPAQUE IOHEXOL 300 MG/ML  SOLN  COMPARISON:  CT abdomen pelvis dated 12/26/2014  FINDINGS: Lower chest:  Lung bases are clear.  Hepatobiliary: Liver is within normal limits.  Gallbladder is unremarkable. No intrahepatic or extrahepatic ductal dilatation.  Pancreas: Within normal limits.  Spleen: Within normal limits.  Adrenals/Urinary Tract: Adrenal glands are unremarkable.  Kidneys are within normal limits.  No hydronephrosis.  Bladder is underdistended but unremarkable.  Stomach/Bowel: Stomach is within normal limits.  No evidence of bowel obstruction.  Abnormal appendix, reflecting acute appendicitis, with surrounding periappendiceal inflammatory changes. Degree of surrounding periappendiceal stranding/fluid raises the possibility of localized perforation.  No drainable fluid collection/abscess. Tiny focus of adjacent periappendiceal gas (series 2/image 61). No free air.  Secondary inflammatory changes involving a loop of distal ileum (series 2/ image 64) as well as the cecum (series 2/image 55).  Vascular/Lymphatic: No evidence of abdominal aortic aneurysm.  No suspicious abdominopelvic lymphadenopathy.  Reproductive: Prostate is unremarkable.  Other: Small volume pelvic ascites. Additional fluid in the right mid abdomen/right paracolic gutter.  Musculoskeletal: Visualized osseous structures are within normal limits.  IMPRESSION: Acute appendicitis.  Adjacent periappendiceal fluid/stranding raises the possibility of localized perforation. No drainable fluid collection/abscess.  Secondary inflammatory changes involving distal ileum and cecum.  Small volume pelvic ascites.   Electronically Signed   By: SJulian HyM.D.   On: 12/28/2014 16:41    Medications / Allergies:  Scheduled Meds: .  ampicillin-sulbactam (UNASYN) IV  3 g Intravenous Q6H  . enoxaparin (LOVENOX) injection  40 mg Subcutaneous Q24H  . potassium chloride  40 mEq Oral TID   Continuous Infusions: . dextrose 5 % and 0.45 % NaCl with KCl 20 mEq/L 75 mL/hr at 12/29/14 1437   PRN Meds:.HYDROcodone-acetaminophen, HYDROmorphone (DILAUDID) injection, ondansetron **OR** ondansetron (ZOFRAN) IV  Antibiotics: Anti-infectives    Start     Dose/Rate Route Frequency Ordered Stop   12/29/14 0600  Ampicillin-Sulbactam (UNASYN) 3 g in sodium chloride 0.9 % 100 mL IVPB     3 g 100 mL/hr over 60 Minutes Intravenous Every 6 hours 12/29/14 0125     12/29/14 0000  Ampicillin-Sulbactam (UNASYN) 3 g in sodium chloride 0.9 % 100 mL IVPB  Status:  Discontinued     3 g 100 mL/hr over 60 Minutes Intravenous Every 6 hours 12/28/14 1850 12/29/14 0130   12/28/14 1745  Ampicillin-Sulbactam (UNASYN) 3 g in sodium chloride 0.9 % 100 mL IVPB  Status:  Discontinued     3 g 100 mL/hr over 60 Minutes Intravenous Every 6 hours 12/28/14 1737 12/28/14 1850   12/28/14 1700  Ampicillin-Sulbactam (UNASYN) 3 g in sodium chloride 0.9 % 100 mL IVPB     3 g 100 mL/hr over 60 Minutes Intravenous  Once 12/28/14 1646 12/28/14 1847       Assessment/Plan acute gangrenous appendicitis with perforation and fecal peritonitis POD#2 laparoscopic appendectomy---Dr. Harlow Asa - 12/28/2014 -advance to [place] diet -DC IVF -pain control--IV dilaudid, toradol x24h, add percocet -mobilize -IS  -SCD, lovenox -continue drain(purulent 87m/24h) -change Unasyn to rocephin/flagyl for better coverage(3-5d IV, then 7-10 PO)  Urinary retention -continue foley, voiding trial on Thursday  Hypokalemia -supplement, repeat labs in AM  Influenza A 1 week ago--on isolation   EErby Pian ANP-BC [facility] Pager [phone](7A-4:30P)   12/30/2014 8:26 AM

## 2014-12-31 LAB — BASIC METABOLIC PANEL
Anion gap: 8 (ref 5–15)
BUN: 16 mg/dL (ref 6–23)
CO2: 29 mmol/L (ref 19–32)
Calcium: 8 mg/dL — ABNORMAL LOW (ref 8.4–10.5)
Chloride: 101 mmol/L (ref 96–112)
Creatinine, Ser: 1 mg/dL (ref 0.50–1.35)
GFR calc Af Amer: >90 mL/min (ref >90)
GFR calc non Af Amer: >90 mL/min (ref >90)
GLUCOSE: 117 mg/dL — AB (ref 70–99)
Potassium: 3.4 mmol/L — ABNORMAL LOW (ref 3.5–5.1)
Sodium: 138 mmol/L (ref 135–145)

## 2014-12-31 MED ORDER — CIPROFLOXACIN HCL 500 MG PO TABS: 500.0000 mg | ORAL_TABLET | Freq: Two times a day (BID) | ORAL | Status: AC

## 2014-12-31 MED ORDER — METRONIDAZOLE 500 MG PO TABS: 500.0000 mg | ORAL_TABLET | Freq: Three times a day (TID) | ORAL | Status: AC

## 2014-12-31 MED ORDER — OXYCODONE HCL 5 MG PO TABS: 5.0000 mg | ORAL_TABLET | ORAL | Status: DC | PRN

## 2014-12-31 NOTE — Discharge Summary (Signed)
Physician Discharge Summary  [place] ZOX:[id number] DOB: 01-24-1981 DOA: 12/28/2014  PCP: No PCP Per Patient  Consultation: none  Admit date: 12/28/2014 Discharge date: 12/31/2014  Recommendations for Outpatient Follow-up:   Follow-up Information    Follow up with Earnstine Regal, MD On 01/06/2015.   Specialty:  General Surgery   Why:  11:30AM for 12PM post op check   Contact information:   [address] [place] [address] [phone]      Discharge Diagnoses:  1.  Gangrenous appendicitis with perforation and fecal peritonitis 2.  S/p laparoscopic appendectomy 3.  Hypokalemia 4.  Urinary retention   Surgical Procedure: laparoscopic appendectomy---Dr. Harlow Asa  Discharge Condition: stable Disposition: home  Diet recommendation: regular  Filed Weights   12/30/14 0634  Weight: 95.255 kg (210 lb)     Filed Vitals:   12/31/14 0652  BP: 123/62  Pulse: 93  Temp: 98 F (36.7 C)  Resp: 18     Hospital Course:  Bobby Hobbs is a healthy 34 year old male who presented with abdominal pain.  His work up showed acute appendicitis.  He underwent the procedure listed above and was transferred to the floor for IV antibiotics.  ON POD#1 he had urinary retention and required insertion of foley.  He was mobilized and diet was advanced.  Unasyn was changed to rocephin/flagyl per protocol.   On POD#2 he had hypokalemia supplemented PO.  On POD#3 the patient was tolerating a diet, ambulating, VSS, pain well controlled and therefore felt stable for discharge.  Foley was removed and the patient voided.  Potassium was supplemented for hypokalemia.  Medication risks, benefits and therapeutic alternatives were reviewed with the patient.  He verbalizes understanding. He was instructed on drain care.  He will follow up with Dr. Harlow Asa in 1-2 weeks.  He was instructed to complete his cipro/flagyl course.   Physical Exam: General appearance: alert and oriented.  Calm and cooperative No acute distress. VSS. Afebrile.  Resp: clear to auscultation bilaterally  Cardio: S1S1 RRR without murmurs or gallops. No edema. GI: soft round and nontender. +BS x4 quadrants. No organomegaly, hernias or masses.  Pulses: +2 bilateral distal pulses without cyanosis  Neurologic: Mental status: Alert, oriented, thought content appropriate     Discharge Instructions     Medication List    STOP taking these medications        oxyCODONE-acetaminophen 5-325 MG per tablet  Commonly known as:  PERCOCET/ROXICET      TAKE these medications        ciprofloxacin 500 MG tablet  Commonly known as:  CIPRO  Take 1 tablet (500 mg total) by mouth 2 (two) times daily.     ibuprofen 200 MG tablet  Commonly known as:  ADVIL,MOTRIN  Take 200 mg by mouth every 6 (six) hours as needed.     metroNIDAZOLE 500 MG tablet  Commonly known as:  FLAGYL  Take 1 tablet (500 mg total) by mouth 3 (three) times daily.     ondansetron 4 MG disintegrating tablet  Commonly known as:  ZOFRAN ODT  Take 1 tablet (4 mg total) by mouth every 8 (eight) hours as needed for nausea or vomiting.     oxyCODONE 5 MG immediate release tablet  Commonly known as:  Oxy IR/ROXICODONE  Take 1-2 tablets (5-10 mg total) by mouth every 4 (four) hours as needed for moderate pain, severe pain or breakthrough pain.     PE-GG-APAP & PE-DPH-APAP LIQUID Misc  Take 5  mLs by mouth every 6 (six) hours as needed (cough/congestion).           Follow-up Information    Follow up with Earnstine Regal, MD.   Specialty:  General Surgery   Contact information:   [address] [place] [address] [phone]        The results of significant diagnostics from this hospitalization (including imaging, microbiology, ancillary and laboratory) are listed below for reference.    Significant Diagnostic Studies: Dg Chest 2 View  12/26/2014   CLINICAL DATA:  Fever, nausea and weakness  EXAM: CHEST  2  VIEW  COMPARISON:  None.  FINDINGS: The heart size and mediastinal contours are within normal limits. Both lungs are clear. The visualized skeletal structures are unremarkable.  IMPRESSION: No active cardiopulmonary disease.   Electronically Signed   By: Andreas Newport M.D.   On: 12/26/2014 01:56   Dg Abd 1 View  12/28/2014   CLINICAL DATA:  Right lower quadrant abdominal pain R10.31 (ICD-10-CM)Abnormal urine normal bowel gas pattern.Fever, unspecified fever cause R50.9 (ICD-10-CM)  EXAM: ABDOMEN - 1 VIEW  COMPARISON:  None.  FINDINGS: Normal bowel gas pattern.  No evidence of obstruction.  No evidence of renal or ureteral stones. Soft tissues are unremarkable.  No significant bony abnormality.  IMPRESSION: Negative.   Electronically Signed   By: Lajean Manes M.D.   On: 12/28/2014 12:13   Ct Abdomen Pelvis W Contrast  12/28/2014   CLINICAL DATA:  Right abdominal pain, nausea/vomiting, re-evaluate for acute appendicitis  EXAM: CT ABDOMEN AND PELVIS WITH CONTRAST  TECHNIQUE: Multidetector CT imaging of the abdomen and pelvis was performed using the standard protocol following bolus administration of intravenous contrast.  CONTRAST:  132mL OMNIPAQUE IOHEXOL 300 MG/ML  SOLN  COMPARISON:  CT abdomen pelvis dated 12/26/2014  FINDINGS: Lower chest:  Lung bases are clear.  Hepatobiliary: Liver is within normal limits.  Gallbladder is unremarkable. No intrahepatic or extrahepatic ductal dilatation.  Pancreas: Within normal limits.  Spleen: Within normal limits.  Adrenals/Urinary Tract: Adrenal glands are unremarkable.  Kidneys are within normal limits.  No hydronephrosis.  Bladder is underdistended but unremarkable.  Stomach/Bowel: Stomach is within normal limits.  No evidence of bowel obstruction.  Abnormal appendix, reflecting acute appendicitis, with surrounding periappendiceal inflammatory changes. Degree of surrounding periappendiceal stranding/fluid raises the possibility of localized perforation.  No  drainable fluid collection/abscess. Tiny focus of adjacent periappendiceal gas (series 2/image 61). No free air.  Secondary inflammatory changes involving a loop of distal ileum (series 2/ image 64) as well as the cecum (series 2/image 55).  Vascular/Lymphatic: No evidence of abdominal aortic aneurysm.  No suspicious abdominopelvic lymphadenopathy.  Reproductive: Prostate is unremarkable.  Other: Small volume pelvic ascites. Additional fluid in the right mid abdomen/right paracolic gutter.  Musculoskeletal: Visualized osseous structures are within normal limits.  IMPRESSION: Acute appendicitis.  Adjacent periappendiceal fluid/stranding raises the possibility of localized perforation. No drainable fluid collection/abscess.  Secondary inflammatory changes involving distal ileum and cecum.  Small volume pelvic ascites.   Electronically Signed   By: Julian Hy M.D.   On: 12/28/2014 16:41   Ct Abdomen Pelvis W Contrast  12/26/2014   CLINICAL DATA:  Abdominal pain and nausea  EXAM: CT ABDOMEN AND PELVIS WITH CONTRAST  TECHNIQUE: Multidetector CT imaging of the abdomen and pelvis was performed using the standard protocol following bolus administration of intravenous contrast.  CONTRAST:  153mL OMNIPAQUE IOHEXOL 300 MG/ML  SOLN  COMPARISON:  None.  FINDINGS: There  are normal appearances of the liver, spleen, pancreas, adrenals and kidneys. Mesentery and bowel appear unremarkable. Abdominal aorta is normal in caliber. There is no adenopathy. There is no ascites. There is no inflammatory change in the abdomen or pelvis. There is a tiny fat containing umbilical hernia. No acute musculoskeletal abnormalities are evident. No significant abnormalities evident in the lower chest.  IMPRESSION: No significant abnormality.   Electronically Signed   By: Andreas Newport M.D.   On: 12/26/2014 04:19    Microbiology: Recent Results (from the past 240 hour(s))  Urine culture     Status: None   Collection Time: 12/28/14  11:44 AM  Result Value Ref Range Status   Colony Count 8,000 COLONIES/ML  Final   Organism ID, Bacteria Insignificant Growth  Final     Labs: Basic Metabolic Panel:  Recent Labs Lab 12/26/14 0054 12/28/14 1359 12/30/14 0503 12/31/14 0425  NA 133* 137 135 138  K 3.2* 3.4* 3.1* 3.4*  CL 101 96 98 101  CO2 27  --  26 29  GLUCOSE 127* 123* 158* 117*  BUN 9 8 13 16   CREATININE 1.17 0.90 0.83 1.00  CALCIUM 8.7  --  8.1* 8.0*   Liver Function Tests:  Recent Labs Lab 12/26/14 0054  AST 65*  ALT 38  ALKPHOS 53  BILITOT 0.7  PROT 6.8  ALBUMIN 4.1    Recent Labs Lab 12/26/14 0054  LIPASE 20   No results for input(s): AMMONIA in the last 168 hours. CBC:  Recent Labs Lab 12/26/14 0054 12/28/14 1144 12/28/14 1359 12/30/14 0503  WBC 8.0 11.6*  --  9.6  NEUTROABS 6.0  --   --   --   HGB 14.7 14.4 16.0 13.1  HCT 44.1 43.9 47.0 39.7  MCV 83.5 83.6  --  82.5  PLT 148*  --   --  202   Cardiac Enzymes: No results for input(s): CKTOTAL, CKMB, CKMBINDEX, TROPONINI in the last 168 hours. BNP: BNP (last 3 results) No results for input(s): BNP in the last 8760 hours.  ProBNP (last 3 results) No results for input(s): PROBNP in the last 8760 hours.  CBG: No results for input(s): GLUCAP in the last 168 hours.  Principal Problem:   Acute gangrenous appendicitis with perforation s/p lap appy 12/28/2014   Time coordinating discharge: <30 mins  Signed:  Anay Rathe, ANP-BC

## 2014-12-31 NOTE — Discharge Instructions (Signed)
LAPAROSCOPIC SURGERY: POST OP INSTRUCTIONS ° °1. DIET: Follow a light bland diet the first 24 hours after arrival home, such as soup, liquids, crackers, etc.  Be sure to include lots of fluids daily.  Avoid fast food or heavy meals as your are more likely to get nauseated.  Eat a low fat the next few days after surgery.   °2. Take your usually prescribed home medications unless otherwise directed. °3. PAIN CONTROL: °a. Pain is best controlled by a usual combination of three different methods TOGETHER: °i. Ice/Heat °ii. Over the counter pain medication °iii. Prescription pain medication °b. Most patients will experience some swelling and bruising around the incisions.  Ice packs or heating pads (30-60 minutes up to 6 times a day) will help. Use ice for the first few days to help decrease swelling and bruising, then switch to heat to help relax tight/sore spots and speed recovery.  Some people prefer to use ice alone, heat alone, alternating between ice & heat.  Experiment to what works for you.  Swelling and bruising can take several weeks to resolve.   °c. It is helpful to take an over-the-counter pain medication regularly for the first few weeks.  Choose one of the following that works best for you: °i. Naproxen (Aleve, etc)  Two 220mg tabs twice a day °ii. Ibuprofen (Advil, etc) Three 200mg tabs four times a day (every meal & bedtime) °iii. Acetaminophen (Tylenol, etc) 500-650mg four times a day (every meal & bedtime) °d. A  prescription for pain medication (such as oxycodone, hydrocodone, etc) should be given to you upon discharge.  Take your pain medication as prescribed.  °i. If you are having problems/concerns with the prescription medicine (does not control pain, nausea, vomiting, rash, itching, etc), please call us [phone] to see if we need to switch you to a different pain medicine that will work better for you and/or control your side effect better. °ii. If you need a refill on your pain medication,  please contact your pharmacy.  They will contact our office to request authorization. Prescriptions will not be filled after 5 pm or on week-ends. °4. Avoid getting constipated.  Between the surgery and the pain medications, it is common to experience some constipation.  Increasing fluid intake and taking a fiber supplement (such as Metamucil, Citrucel, FiberCon, MiraLax, etc) 1-2 times a day regularly will usually help prevent this problem from occurring.  A mild laxative (prune juice, Milk of Magnesia, MiraLax, etc) should be taken according to package directions if there are no bowel movements after 48 hours.   °5. Watch out for diarrhea.  If you have many loose bowel movements, simplify your diet to bland foods & liquids for a few days.  Stop any stool softeners and decrease your fiber supplement.  Switching to mild anti-diarrheal medications (Kayopectate, Pepto Bismol) can help.  If this worsens or does not improve, please call us. °6. Wash / shower every day.  You may shower over the dressings as they are waterproof.  Continue to shower over incision(s) after the dressing is off. °7. Remove your waterproof bandages 5 days after surgery.  You may leave the incision open to air.  You may replace a dressing/Band-Aid to cover the incision for comfort if you wish.  °8. ACTIVITIES as tolerated:   °a. You may resume regular (light) daily activities beginning the next day--such as daily self-care, walking, climbing stairs--gradually increasing activities as tolerated.  If you can walk 30 minutes without difficulty, it   is safe to try more intense activity such as jogging, treadmill, bicycling, low-impact aerobics, swimming, etc. b. Save the most intensive and strenuous activity for last such as sit-ups, heavy lifting, contact sports, etc  Refrain from any heavy lifting or straining until you are off narcotics for pain control.   c. DO NOT PUSH THROUGH PAIN.  Let pain be your guide: If it hurts to do something, don't  do it.  Pain is your body warning you to avoid that activity for another week until the pain goes down. d. You may drive when you are no longer taking prescription pain medication, you can comfortably wear a seatbelt, and you can safely maneuver your car and apply brakes. e. Dennis Bast may have sexual intercourse when it is comfortable.  9. FOLLOW UP in our office a. Please call [facility] at [phone] to set up an appointment to see your surgeon in the office for a follow-up appointment approximately 2-3 weeks after your surgery. b. Make sure that you call for this appointment the day you arrive home to insure a convenient appointment time. 10. IF YOU HAVE DISABILITY OR FAMILY LEAVE FORMS, BRING THEM TO THE OFFICE FOR PROCESSING.  DO NOT GIVE THEM TO YOUR DOCTOR.   WHEN TO CALL us [phone]: 1. Poor pain control 2. Reactions / problems with new medications (rash/itching, nausea, etc)  3. Fever over 101.5 F (38.5 C) 4. Inability to urinate 5. Nausea and/or vomiting 6. Worsening swelling or bruising 7. Continued bleeding from incision. 8. Increased pain, redness, or drainage from the incision   The clinic staff is available to answer your questions during regular business hours (8:30am-5pm).  Please dont hesitate to call and ask to speak to one of our nurses for clinical concerns.   If you have a medical emergency, go to the nearest emergency room or call 911.  A surgeon from [facility] is always on call at the [facility], [place], [place], [place], [place]  [id number] ? MAIN: [phone] ? TOLL FREE: [phone] ?  FAX [phone] [email or web address]  DRAIN CARE:   You have a closed bulb drain to help you heal.  A bulb drain is a small, plastic reservoir which creates a gentle suction. It is used to remove excess fluid from a surgical wound. The color and amount of fluid will vary. Immediately after surgery, the  fluid is bright red. It may gradually change to a yellow color. When the amount decreases to about 1 or 2 tablespoons (15 to 30 cc) per 24 hours, your caregiver will usually remove it.  DAILY CARE  Keep the bulb compressed at all times, except while emptying it. The compression creates suction.   Keep sites where the tubes enter the skin dry and covered with a light bandage (dressing).   Tape the tubes to your skin, 1 to 2 inches below the insertion sites, to keep from pulling on your stitches. Tubes are stitched in place and will not slip out.   Pin the bulb to your shirt (not to your pants) with a safety pin.   For the first few days after surgery, there usually is more fluid in the bulb. Empty the bulb whenever it becomes half full because the bulb does not create enough suction if it is too full. Include this amount in your 24 hour totals.   When the amount of drainage decreases, empty the bulb at the same time every  day. Write down the amounts and the 24 hour totals. Your caregiver will want to know them. This helps your caregiver know when the tubes can be removed.   (We anticipate removing the drain in 1-3 weeks, depending on when the output is <52mL a day for 2+ days)  If there is drainage around the tube sites, change dressings and keep the area dry. If you see a clot in the tube, leave it alone. However, if the tube does not appear to be draining, let your caregiver know.  TO EMPTY THE BULB  Open the stopper to release suction.   Holding the stopper out of the way, pour drainage into the measuring cup that was sent home with you.   Measure and write down the amount. If there are 2 bulbs, note the amount of drainage from bulb 1 or bulb 2 and keep the totals separate. Your caregiver will want to know which tube is draining more.   Compress the bulb by folding it in half.   Replace the stopper.   Check the tape that holds the tube to your skin, and pin the bulb to your shirt.    SEEK MEDICAL CARE IF:  The drainage develops a bad odor.   You have an oral temperature above 102 F (38.9 C).   The amount of drainage from your wound suddenly increases or decreases.   You accidentally pull out your drain.   You have any other questions or concerns.  MAKE SURE YOU:   Understand these instructions.   Will watch your condition.   Will get help right away if you are not doing well or get worse.     Call our office if you have any questions about your drain. [phone]

## 2014-12-31 NOTE — Care Management Note (Signed)
    Page 1 of 1   12/31/2014     1:21:46 PM CARE MANAGEMENT NOTE 12/31/2014  Patient:  Bobby Hobbs, Bobby Hobbs   Account Number:  [id number]  Date Initiated:  12/31/2014  Documentation initiated by:  [facility]  Subjective/Objective Assessment:   adm: acute gangrenous appendicitis with perforation and fecal peritonitis     Action/Plan:   discharge planning   Anticipated DC Date:  12/24/2014   Anticipated DC Plan:  [place]  CM consult  PCP issues      Choice offered to / List presented to:             Status of service:  Completed, signed off Medicare Important Message given?   (If response is "NO", the following Medicare IM given date fields will be blank) Date Medicare IM given:   Medicare IM given by:   Date Additional Medicare IM given:   Additional Medicare IM given by:    Discharge Disposition:  HOME/SELF CARE  Per UR Regulation:    If discussed at Long Length of Stay Meetings, dates discussed:    Comments:  12/31/14 13:10 CM placed [facility]CT NUMBER on pt's AVS so he can call to secure a PCP.  No other Cm needs were communicated.  Mariane Masters, BSN, CM [phone].

## 2015-01-03 LAB — BODY FLUID CULTURE
CULTURE: NO GROWTH
SPECIAL REQUESTS: NORMAL

## 2015-05-11 ENCOUNTER — Ambulatory Visit (INDEPENDENT_AMBULATORY_CARE_PROVIDER_SITE_OTHER): Payer: 59 | Admitting: Emergency Medicine

## 2015-05-11 VITALS — BP 112/68 | HR 61 | Temp 98.6°F | Resp 16 | Ht 72.5 in | Wt 210.0 lb

## 2015-05-11 DIAGNOSIS — J0100 Acute maxillary sinusitis, unspecified: Secondary | ICD-10-CM

## 2015-05-11 DIAGNOSIS — J301 Allergic rhinitis due to pollen: Secondary | ICD-10-CM

## 2015-05-11 MED ORDER — FLUTICASONE PROPIONATE 50 MCG/ACT NA SUSP: 2.0000 | Freq: Every day | NASAL | Status: DC

## 2015-05-11 MED ORDER — AMOXICILLIN-POT CLAVULANATE 875-125 MG PO TABS
1.0000 | ORAL_TABLET | Freq: Two times a day (BID) | ORAL | Status: DC
Start: 2015-05-11 — End: 2015-09-16

## 2015-05-11 NOTE — Patient Instructions (Signed)

## 2015-05-11 NOTE — Progress Notes (Signed)
Chief Complaint:  Chief Complaint  Patient presents with  . Sinusitis    x 2 months    HPI: Bobby Hobbs is a 34 y.o. male who reports to [facility] today complaining of a one-month history of sinus congestion sneezing and cough. On Friday he went to his mother's house and is allergic to smoke and there was lots of smoke in the home. Since then he has had left facial pain and . nasal drainage.  Past Medical History  Diagnosis Date  . Allergy    Past Surgical History  Procedure Laterality Date  . Laparoscopic appendectomy N/A 12/28/2014    Procedure: APPENDECTOMY LAPAROSCOPIC;  Surgeon: Armandina Gemma, MD;  Location: [facility];  Service: General;  Laterality: N/A;   History   Social History  . Marital Status: Married    Spouse Name: N/A  . Number of Children: N/A  . Years of Education: N/A   Social History Main Topics  . Smoking status: Never Smoker   . Smokeless tobacco: Not on file  . Alcohol Use: 0.0 oz/week     Comment: once a year  . Drug Use: No  . Sexual Activity: Yes   Other Topics Concern  . None   Social History Narrative   Family History  Problem Relation Age of Onset  . Diabetes Father   . Diabetes Maternal Grandmother    Allergies  Allergen Reactions  . Dairy Aid [Lactase]   . Hycodan [Hydrocodone-Homatropine] Nausea And Vomiting    Tolerates hydrocodone with acetaminophen   Prior to Admission medications   Medication Sig Start Date End Date Taking? Authorizing Provider  ibuprofen (ADVIL,MOTRIN) 200 MG tablet Take 200 mg by mouth every 6 (six) hours as needed.    Historical Provider, MD  ondansetron (ZOFRAN ODT) 4 MG disintegrating tablet Take 1 tablet (4 mg total) by mouth every 8 (eight) hours as needed for nausea or vomiting. Patient not taking: Reported on 05/11/2015 12/26/14   Alvina Chou, PA-C  oxyCODONE (OXY IR/ROXICODONE) 5 MG immediate release tablet Take 1-2 tablets (5-10 mg total) by mouth every 4 (four) hours as needed for  moderate pain, severe pain or breakthrough pain. Patient not taking: Reported on 05/11/2015 12/31/14   Erby Pian, NP  PE-GG-APAP & PE-DPH-APAP LIQUID MISC Take 5 mLs by mouth every 6 (six) hours as needed (cough/congestion).    Historical Provider, MD     ROS: The patient denies fevers, chills, night sweats, unintentional weight loss, chest pain, palpitations, wheezing, dyspnea on exertion, nausea, vomiting, abdominal pain, dysuria, hematuria, melena, numbness, weakness, or tingling. He has been suffering with about 3 loose stools per day for the last 2 weeks. These are not associated with any blood mucus abdominal pain or nausea vomiting. He has not had any recent travel or been on antibodies recently  All other systems have been reviewed and were otherwise negative with the exception of those mentioned in the HPI and as above.    PHYSICAL EXAM: Filed Vitals:   05/11/15 1107  BP: 112/68  Pulse: 61  Temp: 98.6 F (37 C)  Resp: 16   Body mass index is 28.07 kg/(m^2).   General: Alert, no acute distress HEENT:  Normocephalic, atraumatic, oropharynx patent. Eye: Juliette Mangle [facility] Cardiovascular:  Regular rate and rhythm, no rubs murmurs or gallops.  No Carotid bruits, radial pulse intact. No pedal edema.  Respiratory: Clear to auscultation bilaterally.  No wheezes, rales, or rhonchi.  No cyanosis, no use of accessory musculature Abdominal:  No organomegaly, abdomen is soft and non-tender, positive bowel sounds.  No masses. There are no areas of tenderness or masses there is a healed cholecystectomy scar. Musculoskeletal: Gait intact. No edema, tenderness Skin: No rashes. Neurologic: Facial musculature symmetric. Psychiatric: Patient acts appropriately throughout our interaction. Lymphatic: No cervical or submandibular lymphadenopathy Genitourinary/Anorectal: No acute findings    LABS: Results for orders placed or performed during the hospital encounter of 12/28/14  Body fluid culture    Result Value Ref Range   Specimen Description DRAINAGE    Special Requests Normal    Gram Stain      ABUNDANT WBC PRESENT,BOTH PMN AND MONONUCLEAR NO ORGANISMS SEEN Performed at [facility]    Culture      NO GROWTH 3 DAYS Performed at [facility]    Report Status 01/03/2015 FINAL   CBC  Result Value Ref Range   WBC 9.6 4.0 - 10.5 K/uL   RBC 4.81 4.22 - 5.81 MIL/uL   Hemoglobin 13.1 13.0 - 17.0 g/dL   HCT 39.7 39.0 - 52.0 %   MCV 82.5 78.0 - 100.0 fL   MCH 27.2 26.0 - 34.0 pg   MCHC 33.0 30.0 - 36.0 g/dL   RDW 13.2 11.5 - 15.5 %   Platelets 202 150 - 400 K/uL  Basic metabolic panel  Result Value Ref Range   Sodium 135 135 - 145 mmol/L   Potassium 3.1 (L) 3.5 - 5.1 mmol/L   Chloride 98 96 - 112 mmol/L   CO2 26 19 - 32 mmol/L   Glucose, Bld 158 (H) 70 - 99 mg/dL   BUN 13 6 - 23 mg/dL   Creatinine, Ser 0.83 0.50 - 1.35 mg/dL   Calcium 8.1 (L) 8.4 - 10.5 mg/dL   GFR calc non Af Amer >90 >90 mL/min   GFR calc Af Amer >90 >90 mL/min   Anion gap 11 5 - 15  Basic metabolic panel  Result Value Ref Range   Sodium 138 135 - 145 mmol/L   Potassium 3.4 (L) 3.5 - 5.1 mmol/L   Chloride 101 96 - 112 mmol/L   CO2 29 19 - 32 mmol/L   Glucose, Bld 117 (H) 70 - 99 mg/dL   BUN 16 6 - 23 mg/dL   Creatinine, Ser 1.00 0.50 - 1.35 mg/dL   Calcium 8.0 (L) 8.4 - 10.5 mg/dL   GFR calc non Af Amer >90 >90 mL/min   GFR calc Af Amer >90 >90 mL/min   Anion gap 8 5 - 15  I-Stat Chem 8, ED  Result Value Ref Range   Sodium 137 135 - 145 mmol/L   Potassium 3.4 (L) 3.5 - 5.1 mmol/L   Chloride 96 96 - 112 mmol/L   BUN 8 6 - 23 mg/dL   Creatinine, Ser 0.90 0.50 - 1.35 mg/dL   Glucose, Bld 123 (H) 70 - 99 mg/dL   Calcium, Ion 1.04 (L) 1.12 - 1.23 mmol/L   TCO2 26 0 - 100 mmol/L   Hemoglobin 16.0 13.0 - 17.0 g/dL   HCT 47.0 39.0 - 52.0 %     EKG/XRAY:   Primary read interpreted by Dr. Everlene Farrier at [facility].   ASSESSMENT/PLAN: Patient presents with a sinus infection and  underlying allergies. Will treat with Augmentin and Flonase nasal spray. He also has had some diarrhea. He will continue with Pepto-Bismol and Imodium when necessary if he continues to have symptoms which suggest GI evaluation.   Gross sideeffects, risk and benefits, and alternatives  of medications d/w patient. Patient is aware that all medications have potential sideeffects and we are unable to predict every sideeffect or drug-drug interaction that may occur.  Arlyss Queen MD 05/11/2015 11:24 AM

## 2015-09-16 ENCOUNTER — Ambulatory Visit (INDEPENDENT_AMBULATORY_CARE_PROVIDER_SITE_OTHER): Payer: 59 | Admitting: Physician Assistant

## 2015-09-16 VITALS — BP 104/80 | HR 78 | Temp 97.9°F | Resp 16 | Ht 72.5 in | Wt 218.7 lb

## 2015-09-16 DIAGNOSIS — H01119 Allergic dermatitis of unspecified eye, unspecified eyelid: Secondary | ICD-10-CM | POA: Insufficient documentation

## 2015-09-16 DIAGNOSIS — J309 Allergic rhinitis, unspecified: Secondary | ICD-10-CM

## 2015-09-16 DIAGNOSIS — J069 Acute upper respiratory infection, unspecified: Secondary | ICD-10-CM

## 2015-09-16 MED ORDER — MONTELUKAST SODIUM 10 MG PO TABS: 10.0000 mg | ORAL_TABLET | Freq: Every day | ORAL | Status: DC

## 2015-09-16 MED ORDER — PSEUDOEPHEDRINE HCL ER 120 MG PO TB12: 120.0000 mg | ORAL_TABLET | Freq: Two times a day (BID) | ORAL | Status: DC

## 2015-09-16 NOTE — Progress Notes (Signed)
[facility] [address], [place] [address] [phone]  Date:  09/16/2015   Name:  Bobby Hobbs   DOB:  August 08, 1981   MRN:  [id number]  PCP:  No PCP Per Patient    Chief Complaint: Sinusitis and Headache   History of Present Illness:  This is a 34 y.o. male with PMH allergic rhinitis and eyelid dermatitis who is presenting with sinus pressure, nasal congestion and headache x 3 days.  Cough: no SOB/wheezing: no Nasal congestion: yes Otalgia: no Sore throat: mild Fever/chills: 100.2 last night but no fever this morning Aggravating/alleviating factors: not taking anything currently History of asthma: no History of env allergies: yes, takes claritin daily. States he hates nasal sprays and will not use them. Tobacco use: no  Sees a dermatologist for eyelid dermatitis. Having a flare currently. Has rx cream at home but has not started using yet.   Review of Systems:  Review of Systems See HPI  Patient Active Problem List   Diagnosis Date Noted  . Acute gangrenous appendicitis with perforation s/p lap appy 12/28/2014 12/28/2014  . Dental caries 06/03/2013    Prior to Admission medications   Medication Sig Start Date End Date Taking? Authorizing Provider  Loratadine (CLARITIN) 10 MG CAPS Take by mouth.   Yes Historical Provider, MD  fluticasone (FLONASE) 50 MCG/ACT nasal spray Place 2 sprays into both nostrils daily. Patient not taking: Reported on 09/16/2015 05/11/15   Darlyne Russian, MD  ibuprofen (ADVIL,MOTRIN) 200 MG tablet Take 200 mg by mouth every 6 (six) hours as needed.    Historical Provider, MD    Allergies  Allergen Reactions  . Dairy Aid [Lactase]   . Hycodan [Hydrocodone-Homatropine] Nausea And Vomiting    Tolerates hydrocodone with acetaminophen    Past Surgical History  Procedure Laterality Date  . Laparoscopic appendectomy N/A 12/28/2014    Procedure: APPENDECTOMY LAPAROSCOPIC;  Surgeon: Armandina Gemma, MD;  Location: [facility]  [facility];  Service: General;  Laterality: N/A;    Social History  Substance Use Topics  . Smoking status: Never Smoker   . Smokeless tobacco: None  . Alcohol Use: 0.0 oz/week     Comment: once a year    Family History  Problem Relation Age of Onset  . Diabetes Father   . Diabetes Maternal Grandmother     Medication list has been reviewed and updated.  Physical Examination:  Physical Exam  Constitutional: He is oriented to person, place, and time. He appears well-developed and well-nourished. No distress.  HENT:  Head: Normocephalic and atraumatic.  Right Ear: Hearing, external ear and ear canal normal. Tympanic membrane is retracted.  Left Ear: Hearing, external ear and ear canal normal. Tympanic membrane is retracted.  Nose: Mucosal edema (pale mucosa esp in left nostril) present. Right sinus exhibits maxillary sinus tenderness. Right sinus exhibits no frontal sinus tenderness. Left sinus exhibits maxillary sinus tenderness. Left sinus exhibits no frontal sinus tenderness.  Mouth/Throat: Uvula is midline, oropharynx is clear and moist and mucous membranes are normal.  Eyes: Conjunctivae and lids are normal. Right eye exhibits no discharge. Left eye exhibits no discharge. No scleral icterus.  Cardiovascular: Normal rate, regular rhythm, normal heart sounds and normal pulses.   No murmur heard. Pulmonary/Chest: Effort normal and breath sounds normal. No respiratory distress. He has no wheezes. He has no rhonchi. He has no rales.  Musculoskeletal: Normal range of motion.  Lymphadenopathy:       Head (right side): No submental, no  submandibular and no tonsillar adenopathy present.       Head (left side): No submental, no submandibular and no tonsillar adenopathy present.    He has no cervical adenopathy.  Neurological: He is alert and oriented to person, place, and time.  Skin: Skin is warm, dry and intact.  Erythematous flaking skin bilateral upper eyelids  Psychiatric: He has a  normal mood and affect. His speech is normal and behavior is normal. Thought content normal.    BP 104/80 mmHg  Pulse 78  Temp(Src) 97.9 F (36.6 C) (Oral)  Resp 16  Ht 6' 0.5" (1.842 m)  Wt 218 lb 11.2 oz (99.202 kg)  BMI 29.24 kg/m2  SpO2 98%  Assessment and Plan:  1. Allergic rhinitis, unspecified allergic rhinitis type 2. Viral URI 3. Eyelid dermatitis Likely viral and allergic contributions. Pt refuses nasal sprays. Will add singulair for better allergy control. For current sinus congestion, use sudafed. Discussed home remedies. Use derm cream for current eyelid dermatitis flare. Call in 7 days if symptoms not improved and will send in abx. - montelukast (SINGULAIR) 10 MG tablet; Take 1 tablet (10 mg total) by mouth at bedtime.  Dispense: 30 tablet; Refill: 3 - pseudoephedrine (SUDAFED 12 HOUR) 120 MG 12 hr tablet; Take 1 tablet (120 mg total) by mouth 2 (two) times daily.  Dispense: 30 tablet; Refill: 0    Benjaman Pott. Drenda Freeze, MHS [facility]  09/16/2015

## 2015-09-16 NOTE — Patient Instructions (Signed)
You may take sudafed twice a day to dry out your sinuses. If makes you jittery, just take in the mornings. Hot showers, breathing in steam from shower or pot of boiling onions, eating spicy food can all help your sinuses drain. Continue claritin. Start taking singulair in the evenings. This can help your allergies. Drink plenty of water. If you are not getting better in 7 days, let me know and I would consider sending in antibiotics.

## 2015-11-13 ENCOUNTER — Ambulatory Visit (INDEPENDENT_AMBULATORY_CARE_PROVIDER_SITE_OTHER): Payer: 59 | Admitting: Internal Medicine

## 2015-11-13 VITALS — BP 136/84 | HR 73 | Temp 97.9°F | Resp 17 | Ht 72.5 in | Wt 219.0 lb

## 2015-11-13 DIAGNOSIS — J0100 Acute maxillary sinusitis, unspecified: Secondary | ICD-10-CM

## 2015-11-13 MED ORDER — AMOXICILLIN 875 MG PO TABS: 875.0000 mg | ORAL_TABLET | Freq: Two times a day (BID) | ORAL | Status: DC

## 2015-11-13 MED ORDER — PROMETHAZINE-DM 6.25-15 MG/5ML PO SYRP: 5.0000 mL | ORAL_SOLUTION | Freq: Four times a day (QID) | ORAL | Status: DC | PRN

## 2015-11-13 NOTE — Progress Notes (Addendum)
    Subjective:  By signing my name below, I, [name], attest that this documentation has been prepared under the direction and in the presence of Leandrew Koyanagi, MD Electronically Signed: [name], ED Scribe 11/13/2015 at 8:39 AM.   Patient ID: Bobby Hobbs, male    DOB: 1981/03/02, 35 y.o.   MRN: [id number]  Chief Complaint  Patient presents with  . Cough  . Sore Throat  . URI  . Nasal Congestion   HPI HPI Comments: Bobby Hobbs is a 35 y.o. male who presents to the [facility] complaining of mildly productive cough with green sputum for the past few days. Pt states that cough occasionally keeps him up at night. He reports associated fatigue, nasal congestion, sore throat, HA, mild myalgias. Pt reports similar symptoms with sinus infections that he gets x1-2 year. No treatments tried PTA. Pt denies fever, nausea.   Past Medical History  Diagnosis Date  . Allergy    Current Outpatient Prescriptions on File Prior to Visit  Medication Sig Dispense Refill  . ibuprofen (ADVIL,MOTRIN) 200 MG tablet Take 200 mg by mouth every 6 (six) hours as needed.    . Loratadine (CLARITIN) 10 MG CAPS Take by mouth.     No current facility-administered medications on file prior to visit.   Allergies  Allergen Reactions  . Dairy Aid [Lactase]   . Hycodan [Hydrocodone-Homatropine] Nausea And Vomiting    Tolerates hydrocodone with acetaminophen   Review of Systems  Constitutional: Positive for fatigue. Negative for fever.  HENT: Positive for congestion and sore throat.   Respiratory: Positive for cough.   Gastrointestinal: Negative for nausea.  Musculoskeletal: Positive for myalgias.  Neurological: Positive for headaches.      Objective:   Physical Exam  Constitutional: He is oriented to person, place, and time. He appears well-developed and well-nourished. No distress.  HENT:  Head: Normocephalic and atraumatic.  TMs clear. Purulent  discharge in nares. Throat clear.   Eyes: Conjunctivae and EOM are normal.  Neck: Neck supple.  Cardiovascular: Normal rate.   Pulmonary/Chest: Effort normal. No respiratory distress.  Lungs are clear to auscultation.   Musculoskeletal: Normal range of motion.  Neurological: He is alert and oriented to person, place, and time.  Skin: Skin is warm and dry.  Psychiatric: He has a normal mood and affect. His behavior is normal.  Nursing note and vitals reviewed.     Assessment & Plan:  I have completed the patient encounter in its entirety as documented by the scribe, with editing by me where necessary. Chanci Ojala P. Laney Pastor, M.D. Acute maxillary sinusitis, recurrence not specified  Meds ordered this encounter  Medications  . amoxicillin (AMOXIL) 875 MG tablet    Sig: Take 1 tablet (875 mg total) by mouth 2 (two) times daily.    Dispense:  20 tablet    Refill:  0  . promethazine-dextromethorphan (PROMETHAZINE-DM) 6.25-15 MG/5ML syrup    Sig: Take 5 mLs by mouth 4 (four) times daily as needed for cough.    Dispense:  118 mL    Refill:  0    I have completed the patient encounter in its entirety as documented by the scribe, with editing by me where necessary. Jameila Keeny P. Laney Pastor, M.D.

## 2015-12-12 ENCOUNTER — Emergency Department (HOSPITAL_BASED_OUTPATIENT_CLINIC_OR_DEPARTMENT_OTHER): Payer: Worker's Compensation

## 2015-12-12 ENCOUNTER — Emergency Department (HOSPITAL_BASED_OUTPATIENT_CLINIC_OR_DEPARTMENT_OTHER)
Admission: EM | Admit: 2015-12-12 | Discharge: 2015-12-12 | Disposition: A | Discharge status: Home / Self Care | Payer: Worker's Compensation | Attending: Emergency Medicine | Admitting: Emergency Medicine

## 2015-12-12 ENCOUNTER — Encounter (HOSPITAL_BASED_OUTPATIENT_CLINIC_OR_DEPARTMENT_OTHER): Payer: Self-pay | Admitting: *Deleted

## 2015-12-12 DIAGNOSIS — Y990 Civilian activity done for income or pay: Secondary | ICD-10-CM | POA: Insufficient documentation

## 2015-12-12 DIAGNOSIS — S299XXA Unspecified injury of thorax, initial encounter: Secondary | ICD-10-CM | POA: Diagnosis present

## 2015-12-12 DIAGNOSIS — Y9389 Activity, other specified: Secondary | ICD-10-CM | POA: Diagnosis not present

## 2015-12-12 DIAGNOSIS — Z792 Long term (current) use of antibiotics: Secondary | ICD-10-CM | POA: Diagnosis not present

## 2015-12-12 DIAGNOSIS — Y92149 Unspecified place in prison as the place of occurrence of the external cause: Secondary | ICD-10-CM | POA: Insufficient documentation

## 2015-12-12 DIAGNOSIS — R0789 Other chest pain: Secondary | ICD-10-CM
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
## 2015-12-12 NOTE — Discharge Instructions (Signed)
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
## 2015-12-31 ENCOUNTER — Ambulatory Visit (INDEPENDENT_AMBULATORY_CARE_PROVIDER_SITE_OTHER): Payer: 59 | Admitting: Physician Assistant

## 2015-12-31 VITALS — BP 130/80 | HR 69 | Temp 98.7°F | Resp 16 | Ht 72.0 in | Wt 213.0 lb

## 2015-12-31 DIAGNOSIS — R197 Diarrhea, unspecified: Secondary | ICD-10-CM | POA: Diagnosis not present

## 2015-12-31 DIAGNOSIS — A084 Viral intestinal infection, unspecified: Secondary | ICD-10-CM

## 2015-12-31 LAB — POCT URINALYSIS DIP (MANUAL ENTRY)
BILIRUBIN UA: NEGATIVE
BILIRUBIN UA: NEGATIVE
Blood, UA: NEGATIVE
Glucose, UA: NEGATIVE
LEUKOCYTES UA: NEGATIVE
Nitrite, UA: NEGATIVE
Protein Ur, POC: NEGATIVE
Spec Grav, UA: 1.015
Urobilinogen, UA: 0.2
pH, UA: 6

## 2015-12-31 LAB — POCT CBC
Granulocyte percent: 63.9 %G (ref 37–80)
HEMATOCRIT: 45.7 % (ref 43.5–53.7)
Hemoglobin: 16 g/dL (ref 14.1–18.1)
Lymph, poc: 2.2 (ref 0.6–3.4)
MCH: 28.7 pg (ref 27–31.2)
MCHC: 34.9 g/dL (ref 31.8–35.4)
MCV: 82 fL (ref 80–97)
MID (cbc): 0.4 (ref 0–0.9)
MPV: 8.8 fL (ref 0–99.8)
POC GRANULOCYTE: 4.7 (ref 2–6.9)
POC LYMPH PERCENT: 30.4 %L (ref 10–50)
POC MID %: 5.7 % (ref 0–12)
Platelet Count, POC: 196 10*3/uL (ref 142–424)
RBC: 5.58 M/uL (ref 4.69–6.13)
RDW, POC: 13.2 %
WBC: 7.4 10*3/uL (ref 4.6–10.2)

## 2015-12-31 MED ORDER — ONDANSETRON 8 MG PO TBDP: 8.0000 mg | ORAL_TABLET | Freq: Three times a day (TID) | ORAL | Status: DC | PRN

## 2015-12-31 NOTE — Patient Instructions (Addendum)
IF you received an x-ray today, you will receive an invoice from [facility]. Please contact [facility] at [phone] with questions or concerns regarding your invoice.   IF you received labwork today, you will receive an invoice from [employer]. Please contact [facility] at [phone] with questions or concerns regarding your invoice.   Our billing staff will not be able to assist you with questions regarding bills from these companies.   You will be contacted with the lab results as soon as they are available. The fastest way to get your results is to activate your My Chart account. Instructions are located on the last page of this paperwork. If you have not heard from [place] regarding the results in 2 weeks, please contact this office.    Please continue to hydrate well with 64 oz or more of water. Take medication as prescribed.   Food Choices to Help Relieve Diarrhea, Adult When you have diarrhea, the foods you eat and your eating habits are very important. Choosing the right foods and drinks can help relieve diarrhea. Also, because diarrhea can last up to 7 days, you need to replace lost fluids and electrolytes (such as sodium, potassium, and chloride) in order to help prevent dehydration.  WHAT GENERAL GUIDELINES DO I NEED TO FOLLOW?  Slowly drink 1 cup (8 oz) of fluid for each episode of diarrhea. If you are getting enough fluid, your urine will be clear or pale yellow.  Eat starchy foods. Some good choices include white rice, white toast, pasta, low-fiber cereal, baked potatoes (without the skin), saltine crackers, and bagels.  Avoid large servings of any cooked vegetables.  Limit fruit to two servings per day. A serving is  cup or 1 small piece.  Choose foods with less than 2 g of fiber per serving.  Limit fats to less than 8 tsp (38 g) per day.  Avoid fried foods.  Eat foods that have probiotics in them. Probiotics can be found in  certain dairy products.  Avoid foods and beverages that may increase the speed at which food moves through the stomach and intestines (gastrointestinal tract). Things to avoid include:  High-fiber foods, such as dried fruit, raw fruits and vegetables, nuts, seeds, and whole grain foods.  Spicy foods and high-fat foods.  Foods and beverages sweetened with high-fructose corn syrup, honey, or sugar alcohols such as xylitol, sorbitol, and mannitol. WHAT FOODS ARE RECOMMENDED? Grains White rice. White, [place], or pita breads (fresh or toasted), including plain rolls, buns, or bagels. White pasta. Saltine, soda, or graham crackers. Pretzels. Low-fiber cereal. Cooked cereals made with water (such as cornmeal, farina, or cream cereals). Plain muffins. Matzo. Melba toast. Zwieback.  Vegetables Potatoes (without the skin). Strained tomato and vegetable juices. Most well-cooked and canned vegetables without seeds. Tender lettuce. Fruits Cooked or canned applesauce, apricots, cherries, fruit cocktail, grapefruit, peaches, pears, or plums. Fresh bananas, apples without skin, cherries, grapes, cantaloupe, grapefruit, peaches, oranges, or plums.  Meat and Other Protein Products Baked or boiled chicken. Eggs. Tofu. Fish. Seafood. Smooth peanut butter. Ground or well-cooked tender beef, ham, veal, lamb, pork, or poultry.  Dairy Plain yogurt, kefir, and unsweetened liquid yogurt. Lactose-free milk, buttermilk, or soy milk. Plain hard cheese. Beverages Sport drinks. Clear broths. Diluted fruit juices (except prune). Regular, caffeine-free sodas such as ginger ale. Water. Decaffeinated teas. Oral rehydration solutions. Sugar-free beverages not sweetened with sugar alcohols. Other Bouillon, broth, or soups made from recommended foods.  The items listed above may not  be a complete list of recommended foods or beverages. Contact your dietitian for more options. WHAT FOODS ARE NOT RECOMMENDED? Grains Whole grain,  whole wheat, bran, or rye breads, rolls, pastas, crackers, and cereals. Wild or brown rice. Cereals that contain more than 2 g of fiber per serving. Corn tortillas or taco shells. Cooked or dry oatmeal. Granola. Popcorn. Vegetables Raw vegetables. Cabbage, broccoli, Brussels sprouts, artichokes, baked beans, beet greens, corn, kale, legumes, peas, sweet potatoes, and yams. Potato skins. Cooked spinach and cabbage. Fruits Dried fruit, including raisins and dates. Raw fruits. Stewed or dried prunes. Fresh apples with skin, apricots, mangoes, pears, raspberries, and strawberries.  Meat and Other Protein Products Chunky peanut butter. Nuts and seeds. Beans and lentils. Berniece Salines.  Dairy High-fat cheeses. Milk, chocolate milk, and beverages made with milk, such as milk shakes. Cream. Ice cream. Sweets and Desserts Sweet rolls, doughnuts, and sweet breads. Pancakes and waffles. Fats and Oils Butter. Cream sauces. Margarine. Salad oils. Plain salad dressings. Olives. Avocados.  Beverages Caffeinated beverages (such as coffee, tea, soda, or energy drinks). Alcoholic beverages. Fruit juices with pulp. Prune juice. Soft drinks sweetened with high-fructose corn syrup or sugar alcohols. Other Coconut. Hot sauce. Chili powder. Mayonnaise. Gravy. Cream-based or milk-based soups.  The items listed above may not be a complete list of foods and beverages to avoid. Contact your dietitian for more information. WHAT SHOULD I DO IF I BECOME DEHYDRATED? Diarrhea can sometimes lead to dehydration. Signs of dehydration include dark urine and dry mouth and skin. If you think you are dehydrated, you should rehydrate with an oral rehydration solution. These solutions can be purchased at pharmacies, retail stores, or online.  Drink -1 cup (120-240 mL) of oral rehydration solution each time you have an episode of diarrhea. If drinking this amount makes your diarrhea worse, try drinking smaller amounts more often. For example,  drink 1-3 tsp (5-15 mL) every 5-10 minutes.  A general rule for staying hydrated is to drink 1-2 L of fluid per day. Talk to your health care provider about the specific amount you should be drinking each day. Drink enough fluids to keep your urine clear or pale yellow.   This information is not intended to replace advice given to you by your health care provider. Make sure you discuss any questions you have with your health care provider.   Document Released: 12/17/2003 Document Revised: 10/17/2014 Document Reviewed: 08/19/2013 Elsevier Interactive Patient Education 2016 Elsevier Inc.   Viral Gastroenteritis Viral gastroenteritis is also known as stomach flu. This condition affects the stomach and intestinal tract. It can cause sudden diarrhea and vomiting. The illness typically lasts 3 to 8 days. Most people develop an immune response that eventually gets rid of the virus. While this natural response develops, the virus can make you quite ill. CAUSES  Many different viruses can cause gastroenteritis, such as rotavirus or noroviruses. You can catch one of these viruses by consuming contaminated food or water. You may also catch a virus by sharing utensils or other personal items with an infected person or by touching a contaminated surface. SYMPTOMS  The most common symptoms are diarrhea and vomiting. These problems can cause a severe loss of body fluids (dehydration) and a body salt (electrolyte) imbalance. Other symptoms may include:  Fever.  Headache.  Fatigue.  Abdominal pain. DIAGNOSIS  Your caregiver can usually diagnose viral gastroenteritis based on your symptoms and a physical exam. A stool sample may also be taken to test for the presence of  viruses or other infections. TREATMENT  This illness typically goes away on its own. Treatments are aimed at rehydration. The most serious cases of viral gastroenteritis involve vomiting so severely that you are not able to keep fluids  down. In these cases, fluids must be given through an intravenous line (IV). HOME CARE INSTRUCTIONS   Drink enough fluids to keep your urine clear or pale yellow. Drink small amounts of fluids frequently and increase the amounts as tolerated.  Ask your caregiver for specific rehydration instructions.  Avoid:  Foods high in sugar.  Alcohol.  Carbonated drinks.  Tobacco.  Juice.  Caffeine drinks.  Extremely hot or cold fluids.  Fatty, greasy foods.  Too much intake of anything at one time.  Dairy products until 24 to 48 hours after diarrhea stops.  You may consume probiotics. Probiotics are active cultures of beneficial bacteria. They may lessen the amount and number of diarrheal stools in adults. Probiotics can be found in yogurt with active cultures and in supplements.  Wash your hands well to avoid spreading the virus.  Only take over-the-counter or prescription medicines for pain, discomfort, or fever as directed by your caregiver. Do not give aspirin to children. Antidiarrheal medicines are not recommended.  Ask your caregiver if you should continue to take your regular prescribed and over-the-counter medicines.  Keep all follow-up appointments as directed by your caregiver. SEEK IMMEDIATE MEDICAL CARE IF:   You are unable to keep fluids down.  You do not urinate at least once every 6 to 8 hours.  You develop shortness of breath.  You notice blood in your stool or vomit. This may look like coffee grounds.  You have abdominal pain that increases or is concentrated in one small area (localized).  You have persistent vomiting or diarrhea.  You have a fever.  The patient is a child younger than 3 months, and he or she has a fever.  The patient is a child older than 3 months, and he or she has a fever and persistent symptoms.  The patient is a child older than 3 months, and he or she has a fever and symptoms suddenly get worse.  The patient is a baby, and he  or she has no tears when crying. MAKE SURE YOU:   Understand these instructions.  Will watch your condition.  Will get help right away if you are not doing well or get worse.   This information is not intended to replace advice given to you by your health care provider. Make sure you discuss any questions you have with your health care provider.   Document Released: 09/26/2005 Document Revised: 12/19/2011 Document Reviewed: 07/13/2011 Elsevier Interactive Patient Education [employer].

## 2015-12-31 NOTE — Progress Notes (Signed)
[facility] [address], [place] [address] [phone]  Date:  12/31/2015   Name:  Bobby Hobbs   DOB:  [birth year]   MRN:  [id number]  PCP:  No PCP Per Patient   Chief Complaint  Patient presents with  . Diarrhea    X 5 DAYS   . Nausea    X this morning     History of Present Illness:  Bobby Hobbs is a 35 y.o. male patient who presents to [facility] for chief complaint of diarrhea and emesis.    He had diarrhea that started without abdominal pain assocated.  It began with 8-9 episodes the first day.  This has decreased to 6-7 episodes daily.  This started to subside 3 days ago, but then restarted yesterday.  Non-bloody diarrhea.  He is eating regularly.  No fever.  No dizziness.  He had some nausea and vomiting twice between today and yesterday.  He feels slightly queasy at this time.  He ate 9:30am this morning.  No weaknessor pre-syncope.  No sob or cp.    He works as a Corporate treasurer.    Patient Active Problem List   Diagnosis Date Noted  . Eyelid dermatitis, allergic/contact 09/16/2015  . Rhinitis, allergic 09/16/2015  . Acute gangrenous appendicitis with perforation s/p lap appy 12/28/2014 12/28/2014  . Dental caries 06/03/2013    Past Medical History  Diagnosis Date  . Allergy     Past Surgical History  Procedure Laterality Date  . Laparoscopic appendectomy N/A 12/28/2014    Procedure: APPENDECTOMY LAPAROSCOPIC;  Surgeon: Armandina Gemma, MD;  Location: [facility];  Service: General;  Laterality: N/A;    Social History  Substance Use Topics  . Smoking status: Never Smoker   . Smokeless tobacco: Never Used  . Alcohol Use: 0.0 oz/week    0 Standard drinks or equivalent per week     Comment: once a year    Family History  Problem Relation Age of Onset  . Diabetes Father   . Diabetes Maternal Grandmother     Allergies  Allergen Reactions  . Dairy Aid [Lactase]   . Hycodan [Hydrocodone-Homatropine] Nausea And Vomiting     Tolerates hydrocodone with acetaminophen    Medication list has been reviewed and updated.  No current outpatient prescriptions on file prior to visit.   No current facility-administered medications on file prior to visit.    ROS ROS otherwise unremarkable unless listed above.  Physical Examination: BP 130/80 mmHg  Pulse 69  Temp(Src) 98.7 F (37.1 C) (Oral)  Resp 16  Ht 6' (1.829 m)  Wt 213 lb (96.616 kg)  BMI 28.88 kg/m2  SpO2 98% Ideal Body Weight: Weight in (lb) to have BMI = 25: 183.9  Physical Exam  Constitutional: He is oriented to person, place, and time. He appears well-developed and well-nourished. No distress.  HENT:  Head: Normocephalic and atraumatic.  Eyes: Conjunctivae and EOM are normal. Pupils are equal, round, and reactive to light.  Cardiovascular: Normal rate.  Exam reveals no gallop and no friction rub.   No murmur heard. Pulmonary/Chest: Effort normal. No respiratory distress. He has no decreased breath sounds. He has no wheezes. He has no rhonchi.  Abdominal: Soft. Normal appearance and bowel sounds are normal. There is no hepatosplenomegaly. There is no tenderness. There is no tenderness at McBurney's point and negative Murphy's sign.  Neurological: He is alert and oriented to person, place, and time.  Skin: Skin is warm and  dry. He is not diaphoretic.  Psychiatric: He has a normal mood and affect. His behavior is normal.     Results for orders placed or performed in visit on 12/31/15  POCT CBC  Result Value Ref Range   WBC 7.4 4.6 - 10.2 K/uL   Lymph, poc 2.2 0.6 - 3.4   POC LYMPH PERCENT 30.4 10 - 50 %L   MID (cbc) 0.4 0 - 0.9   POC MID % 5.7 0 - 12 %M   POC Granulocyte 4.7 2 - 6.9   Granulocyte percent 63.9 37 - 80 %G   RBC 5.58 4.69 - 6.13 M/uL   Hemoglobin 16.0 14.1 - 18.1 g/dL   HCT, POC 45.7 43.5 - 53.7 %   MCV 82.0 80 - 97 fL   MCH, POC 28.7 27 - 31.2 pg   MCHC 34.9 31.8 - 35.4 g/dL   RDW, POC 13.2 %   Platelet Count, POC  196 142 - 424 K/uL   MPV 8.8 0 - 99.8 fL  POCT urinalysis dipstick  Result Value Ref Range   Color, UA yellow yellow   Clarity, UA clear clear   Glucose, UA negative negative   Bilirubin, UA negative negative   Ketones, POC UA negative negative   Spec Grav, UA 1.015    Blood, UA negative negative   pH, UA 6.0    Protein Ur, POC negative negative   Urobilinogen, UA 0.2    Nitrite, UA Negative Negative   Leukocytes, UA Negative Negative     Assessment and Plan: Bobby Hobbs is a 35 y.o. male who is here today with chief complaint of diarrhea and emesis. This appears viral at this time. I have advised him to not use Imodium at this time. Episodes appear to be resolving. Given Zofran for nausea and vomiting, for home. Advised hydration and diarrhea friendly diet  Viral gastroenteritis - Plan: ondansetron (ZOFRAN-ODT) 8 MG disintegrating tablet  Diarrhea, unspecified type - Plan: POCT CBC, POCT urinalysis dipstick   Ivar Drape, PA-C [facility] 12/31/2015 5:32 PM

## 2016-03-15 ENCOUNTER — Ambulatory Visit (INDEPENDENT_AMBULATORY_CARE_PROVIDER_SITE_OTHER): Payer: 59 | Admitting: Physician Assistant

## 2016-03-15 VITALS — BP 126/70 | HR 78 | Temp 98.3°F | Resp 16 | Ht 72.0 in | Wt 213.0 lb

## 2016-03-15 DIAGNOSIS — M25522 Pain in left elbow: Secondary | ICD-10-CM

## 2016-03-15 DIAGNOSIS — T148XXA Other injury of unspecified body region, initial encounter: Secondary | ICD-10-CM

## 2016-03-15 NOTE — Progress Notes (Signed)
[facility] [address], [place] [address] [phone]  Date:  03/15/2016   Name:  Bobby CrochetChristopher H Hobbs   DOB:  12/21/1980   MRN:  [id number]  PCP:  No PCP Per Patient   Chief Complaint  Patient presents with  . Elbow Pain    Left     History of Present Illness:  Bobby CrochetChristopher H Hobbs is a 35 y.o. male patient who [facility] of elbow pain.  Left elbow pain since 3 days ago.  He was moving furniture.  He thought he had some swelling to the area on the anterior left side of left elbow.  The pain was along the upper and lower arm.  No warmth to the area.  Over the last day, he has found improvement of his pain.  Icing has helped.  He has had no injury of the elbow prior.  He has missed work due to the pain, and mostly because he works as a Biochemist, clinicaldetention officer, where contact with individuals can happen at any time, and may require exacerbation of his left elbow pain.      Patient Active Problem List   Diagnosis Date Noted  . Eyelid dermatitis, allergic/contact 09/16/2015  . Rhinitis, allergic 09/16/2015  . Acute gangrenous appendicitis with perforation s/p lap appy 12/28/2014 12/28/2014  . Dental caries 06/03/2013    Past Medical History  Diagnosis Date  . Allergy     Past Surgical History  Procedure Laterality Date  . Laparoscopic appendectomy N/A 12/28/2014    Procedure: APPENDECTOMY LAPAROSCOPIC;  Surgeon: Darnell Levelodd Gerkin, MD;  Location: [facility];  Service: General;  Laterality: N/A;    Social History  Substance Use Topics  . Smoking status: Never Smoker   . Smokeless tobacco: Never Used  . Alcohol Use: 0.0 oz/week    0 Standard drinks or equivalent per week     Comment: once a year    Family History  Problem Relation Age of Onset  . Diabetes Father   . Diabetes Maternal Grandmother     Allergies  Allergen Reactions  . Dairy Aid [Lactase]   . Hycodan [Hydrocodone-Homatropine] Nausea And Vomiting    Tolerates hydrocodone  with acetaminophen    Medication list has been reviewed and updated.  Current Outpatient Prescriptions on File Prior to Visit  Medication Sig Dispense Refill  . Ibuprofen (ADVIL PO) Take by mouth.    . Loratadine-Pseudoephedrine (CLARITIN-D 24 HOUR PO) Take by mouth. Reported on 03/15/2016    . ondansetron (ZOFRAN-ODT) 8 MG disintegrating tablet Take 1 tablet (8 mg total) by mouth every 8 (eight) hours as needed for nausea. (Patient not taking: Reported on 03/15/2016) 30 tablet 0   No current facility-administered medications on file prior to visit.    ROS ROS otherwise unremarkable unless listed above.   Physical Examination: BP 126/70 mmHg  Pulse 78  Temp(Src) 98.3 F (36.8 C) (Oral)  Resp 16  Ht 6' (1.829 m)  Wt 213 lb (96.616 kg)  BMI 28.88 kg/m2  SpO2 97% Ideal Body Weight: Weight in (lb) to have BMI = 25: 183.9  Physical Exam  Constitutional: He is oriented to person, place, and time. He appears well-developed and well-nourished. No distress.  HENT:  Head: Normocephalic and atraumatic.  Eyes: Conjunctivae and EOM are normal. Pupils are equal, round, and reactive to light.  Cardiovascular: Normal rate.   Pulmonary/Chest: Effort normal. No respiratory distress.  Musculoskeletal:       Left elbow: He  exhibits normal range of motion. Tenderness (along the anterior proximal brachioradialis.  no swelling, erythema.  ) found. No radial head, no medial epicondyle, no lateral epicondyle and no olecranon process tenderness noted.  Neurological: He is alert and oriented to person, place, and time.  Skin: Skin is warm and dry. He is not diaphoretic.  Swelling of back with sebaceous material upon palpation.  No erythema.    Psychiatric: He has a normal mood and affect. His behavior is normal.   Results for orders placed or performed in visit on 12/31/15  POCT CBC  Result Value Ref Range   WBC 7.4 4.6 - 10.2 K/uL   Lymph, poc 2.2 0.6 - 3.4   POC LYMPH PERCENT 30.4 10 - 50 %L    MID (cbc) 0.4 0 - 0.9   POC MID % 5.7 0 - 12 %M   POC Granulocyte 4.7 2 - 6.9   Granulocyte percent 63.9 37 - 80 %G   RBC 5.58 4.69 - 6.13 M/uL   Hemoglobin 16.0 14.1 - 18.1 g/dL   HCT, POC 45.7 43.5 - 53.7 %   MCV 82.0 80 - 97 fL   MCH, POC 28.7 27 - 31.2 pg   MCHC 34.9 31.8 - 35.4 g/dL   RDW, POC 13.2 %   Platelet Count, POC 196 142 - 424 K/uL   MPV 8.8 0 - 99.8 fL  POCT urinalysis dipstick  Result Value Ref Range   Color, UA yellow yellow   Clarity, UA clear clear   Glucose, UA negative negative   Bilirubin, UA negative negative   Ketones, POC UA negative negative   Spec Grav, UA 1.015    Blood, UA negative negative   pH, UA 6.0    Protein Ur, POC negative negative   Urobilinogen, UA 0.2    Nitrite, UA Negative Negative   Leukocytes, UA Negative Negative    Assessment and Plan: [place] is a 35 y.o. male who is here today for left elbow pain. Note given for work. I've advised him to use anti-inflammatories and stretching as well as icing. Return to clinic if symptoms do not improve. Elbow pain, left  Muscle strain  Ivar Drape, PA-C [facility] 6/6/20177:36 PM

## 2016-03-15 NOTE — Patient Instructions (Addendum)
     IF you received an x-ray today, you will receive an invoice from [facility]. Please contact [facility] at [phone] with questions or concerns regarding your invoice.   IF you received labwork today, you will receive an invoice from [employer]. Please contact [facility] at [phone] with questions or concerns regarding your invoice.   Our billing staff will not be able to assist you with questions regarding bills from these companies.  You will be contacted with the lab results as soon as they are available. The fastest way to get your results is to activate your My Chart account. Instructions are located on the last page of this paperwork. If you have not heard from [place] regarding the results in 2 weeks, please contact this office.    Please ice the area as you have been doing, and taking the NSAIDs with food.  Please stretch the left arm three times per day, and then apply ice.

## 2016-12-16 ENCOUNTER — Ambulatory Visit (INDEPENDENT_AMBULATORY_CARE_PROVIDER_SITE_OTHER): Payer: 59 | Admitting: Urgent Care

## 2016-12-16 VITALS — BP 122/78 | HR 76 | Temp 97.5°F | Resp 16 | Ht 72.0 in | Wt 220.0 lb

## 2016-12-16 DIAGNOSIS — R197 Diarrhea, unspecified: Secondary | ICD-10-CM | POA: Diagnosis not present

## 2016-12-16 DIAGNOSIS — R110 Nausea: Secondary | ICD-10-CM

## 2016-12-16 DIAGNOSIS — A084 Viral intestinal infection, unspecified: Secondary | ICD-10-CM | POA: Diagnosis not present

## 2016-12-16 LAB — POCT CBC
GRANULOCYTE PERCENT: 66.7 % (ref 37–80)
HCT, POC: 49.4 % (ref 43.5–53.7)
Hemoglobin: 17 g/dL (ref 14.1–18.1)
LYMPH, POC: 2.9 (ref 0.6–3.4)
MCH: 28.5 pg (ref 27–31.2)
MCHC: 34.5 g/dL (ref 31.8–35.4)
MCV: 82.5 fL (ref 80–97)
MID (cbc): 0.8 (ref 0–0.9)
MPV: 9.1 fL (ref 0–99.8)
POC Granulocyte: 7.3 — AB (ref 2–6.9)
POC LYMPH PERCENT: 26 %L (ref 10–50)
POC MID %: 7.3 % (ref 0–12)
Platelet Count, POC: 263 10*3/uL (ref 142–424)
RBC: 5.99 M/uL (ref 4.69–6.13)
RDW, POC: 13.4 %
WBC: 11 10*3/uL — AB (ref 4.6–10.2)

## 2016-12-16 LAB — POCT URINALYSIS DIP (MANUAL ENTRY)
BILIRUBIN UA: NEGATIVE
BILIRUBIN UA: NEGATIVE
Blood, UA: NEGATIVE
Glucose, UA: NEGATIVE
LEUKOCYTES UA: NEGATIVE
Nitrite, UA: NEGATIVE
PROTEIN UA: NEGATIVE
SPEC GRAV UA: 1.02
Urobilinogen, UA: 0.2
pH, UA: 5.5

## 2016-12-16 NOTE — Patient Instructions (Signed)

## 2016-12-16 NOTE — Progress Notes (Signed)
MRN: [id number] DOB: [birth year]  Subjective:   Bobby Hobbs is a 36 y.o. male presenting for chief complaint of stomach bug (x2 days ; denies vomiting); Nausea; and Diarrhea  Reports 4 day history of watery diarrhea, having 10 bowel movements per day. Also has nausea without vomiting. Has tried Pepto with minimal relief. 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 Denies fever, vomiting, abdominal pain, bloody stools. Denies eating questionable or uncooked food, drinking unfiltered water.  Edrei is not currently taking any medications. Also is allergic to dairy aid [lactase] and hycodan [hydrocodone-homatropine].  Demico  has a past medical history of Allergy. Also  has a past surgical history that includes laparoscopic appendectomy (N/A, 12/28/2014).  Objective:   Vitals: BP 122/78   Pulse 76   Temp 97.5 F (36.4 C) (Oral)   Resp 16   Ht 6' (1.829 m)   Wt 220 lb (99.8 kg)   SpO2 97%   BMI 29.84 kg/m   Physical Exam  Constitutional: He is oriented to person, place, and time. He appears well-developed and well-nourished.  HENT:  Lips appear dry and cracked.  Cardiovascular: Normal rate, regular rhythm and intact distal pulses.  Exam reveals no gallop and no friction rub.   No murmur heard. Pulmonary/Chest: No respiratory distress. He has no wheezes. He has no rales.  Abdominal: Soft. Bowel sounds are normal. He exhibits no distension and no mass. There is no tenderness. There is no guarding.  Neurological: He is alert and oriented to person, place, and time.  Skin: Skin is warm and dry.   Results for orders placed or performed in visit on 12/16/16 (from the past 24 hour(s))  POCT urinalysis dipstick     Status: None   Collection Time: 12/16/16  3:23 PM  Result Value Ref Range   Color, UA yellow yellow   Clarity, UA clear clear   Glucose, UA negative negative   Bilirubin, UA negative negative   Ketones, POC UA negative negative   Spec  Grav, UA 1.020    Blood, UA negative negative   pH, UA 5.5    Protein Ur, POC negative negative   Urobilinogen, UA 0.2    Nitrite, UA Negative Negative   Leukocytes, UA Negative Negative  POCT CBC     Status: Abnormal   Collection Time: 12/16/16  3:28 PM  Result Value Ref Range   WBC 11.0 (A) 4.6 - 10.2 K/uL   Lymph, poc 2.9 0.6 - 3.4   POC LYMPH PERCENT 26.0 10 - 50 %L   MID (cbc) 0.8 0 - 0.9   POC MID % 7.3 0 - 12 %M   POC Granulocyte 7.3 (A) 2 - 6.9   Granulocyte percent 66.7 37 - 80 %G   RBC 5.99 4.69 - 6.13 M/uL   Hemoglobin 17.0 14.1 - 18.1 g/dL   HCT, POC 49.4 43.5 - 53.7 %   MCV 82.5 80 - 97 fL   MCH, POC 28.5 27 - 31.2 pg   MCHC 34.5 31.8 - 35.4 g/dL   RDW, POC 13.4 %   Platelet Count, POC 263 142 - 424 K/uL   MPV 9.1 0 - 99.8 fL   Assessment and Plan :   1. Viral gastroenteritis 2. Diarrhea, unspecified type 3. Nausea without vomiting - Patient declined IV fluids, Zofran. Will manage conservatively. Hold off on stool culture. RTC if no improvement in 3 days.  Jaynee Eagles, PA-C [facility] [phone] 12/16/2016  2:53 PM

## 2016-12-17 LAB — BASIC METABOLIC PANEL
BUN/Creatinine Ratio: 8 — ABNORMAL LOW (ref 9–20)
BUN: 9 mg/dL (ref 6–20)
CO2: 24 mmol/L (ref 18–29)
CREATININE: 1.07 mg/dL (ref 0.76–1.27)
Calcium: 9.4 mg/dL (ref 8.7–10.2)
Chloride: 97 mmol/L (ref 96–106)
GFR calc Af Amer: 103 mL/min/{1.73_m2} (ref >59)
GFR calc non Af Amer: 89 mL/min/{1.73_m2} (ref >59)
GLUCOSE: 86 mg/dL (ref 65–99)
POTASSIUM: 3.9 mmol/L (ref 3.5–5.2)
Sodium: 140 mmol/L (ref 134–144)

## 2017-03-28 ENCOUNTER — Ambulatory Visit (INDEPENDENT_AMBULATORY_CARE_PROVIDER_SITE_OTHER): Payer: 59 | Admitting: Urgent Care

## 2017-03-28 ENCOUNTER — Encounter: Payer: Self-pay | Admitting: Urgent Care

## 2017-03-28 VITALS — BP 111/74 | HR 77 | Temp 98.6°F | Resp 16 | Ht 72.0 in | Wt 218.8 lb

## 2017-03-28 DIAGNOSIS — R0982 Postnasal drip: Secondary | ICD-10-CM

## 2017-03-28 DIAGNOSIS — J018 Other acute sinusitis: Secondary | ICD-10-CM | POA: Diagnosis not present

## 2017-03-28 DIAGNOSIS — J3089 Other allergic rhinitis: Secondary | ICD-10-CM

## 2017-03-28 MED ORDER — AMOXICILLIN 500 MG PO CAPS: 500.0000 mg | ORAL_CAPSULE | Freq: Three times a day (TID) | ORAL | 0 refills | Status: DC

## 2017-03-28 MED ORDER — PSEUDOEPHEDRINE HCL ER 120 MG PO TB12: 120.0000 mg | ORAL_TABLET | Freq: Two times a day (BID) | ORAL | 3 refills | Status: DC

## 2017-03-28 MED ORDER — MONTELUKAST SODIUM 10 MG PO TABS
10.0000 mg | ORAL_TABLET | Freq: Every day | ORAL | 3 refills | Status: DC
Start: 2017-03-28 — End: 2017-11-20

## 2017-03-28 NOTE — Patient Instructions (Addendum)
Allergic Rhinitis Allergic rhinitis is when the mucous membranes in the nose respond to allergens. Allergens are particles in the air that cause your body to have an allergic reaction. This causes you to release allergic antibodies. Through a chain of events, these eventually cause you to release histamine into the blood stream. Although meant to protect the body, it is this release of histamine that causes your discomfort, such as frequent sneezing, congestion, and an itchy, runny nose. What are the causes? Seasonal allergic rhinitis (hay fever) is caused by pollen allergens that may come from grasses, trees, and weeds. Year-round allergic rhinitis (perennial allergic rhinitis) is caused by allergens such as house dust mites, pet dander, and mold spores. What are the signs or symptoms?  Nasal stuffiness (congestion).  Itchy, runny nose with sneezing and tearing of the eyes. How is this diagnosed? Your health care provider can help you determine the allergen or allergens that trigger your symptoms. If you and your health care provider are unable to determine the allergen, skin or blood testing may be used. Your health care provider will diagnose your condition after taking your health history and performing a physical exam. Your health care provider may assess you for other related conditions, such as asthma, pink eye, or an ear infection. How is this treated? Allergic rhinitis does not have a cure, but it can be controlled by:  Medicines that block allergy symptoms. These may include allergy shots, nasal sprays, and oral antihistamines.  Avoiding the allergen.  Hay fever may often be treated with antihistamines in pill or nasal spray forms. Antihistamines block the effects of histamine. There are over-the-counter medicines that may help with nasal congestion and swelling around the eyes. Check with your health care provider before taking or giving this medicine. If avoiding the allergen or the  medicine prescribed do not work, there are many new medicines your health care provider can prescribe. Stronger medicine may be used if initial measures are ineffective. Desensitizing injections can be used if medicine and avoidance does not work. Desensitization is when a patient is given ongoing shots until the body becomes less sensitive to the allergen. Make sure you follow up with your health care provider if problems continue. Follow these instructions at home: It is not possible to completely avoid allergens, but you can reduce your symptoms by taking steps to limit your exposure to them. It helps to know exactly what you are allergic to so that you can avoid your specific triggers. Contact a health care provider if:  You have a fever.  You develop a cough that does not stop easily (persistent).  You have shortness of breath.  You start wheezing.  Symptoms interfere with normal daily activities. This information is not intended to replace advice given to you by your health care provider. Make sure you discuss any questions you have with your health care provider. Document Released: 06/21/2001 Document Revised: 05/27/2016 Document Reviewed: 06/03/2013 Elsevier Interactive Patient Education  2017 Elsevier Inc.    Sinusitis, Adult Sinusitis is soreness and inflammation of your sinuses. Sinuses are hollow spaces in the bones around your face. Your sinuses are located:  Around your eyes.  In the middle of your forehead.  Behind your nose.  In your cheekbones.  Your sinuses and nasal passages are lined with a stringy fluid (mucus). Mucus normally drains out of your sinuses. When your nasal tissues become inflamed or swollen, the mucus can become trapped or blocked so air cannot flow through your sinuses.  This allows bacteria, viruses, and funguses to grow, which leads to infection. Sinusitis can develop quickly and last for 7?10 days (acute) or for more than 12 weeks (chronic).  Sinusitis often develops after a cold. What are the causes? This condition is caused by anything that creates swelling in the sinuses or stops mucus from draining, including:  Allergies.  Asthma.  Bacterial or viral infection.  Abnormally shaped bones between the nasal passages.  Nasal growths that contain mucus (nasal polyps).  Narrow sinus openings.  Pollutants, such as chemicals or irritants in the air.  A foreign object stuck in the nose.  A fungal infection. This is rare.  What increases the risk? The following factors may make you more likely to develop this condition:  Having allergies or asthma.  Having had a recent cold or respiratory tract infection.  Having structural deformities or blockages in your nose or sinuses.  Having a weak immune system.  Doing a lot of swimming or diving.  Overusing nasal sprays.  Smoking.  What are the signs or symptoms? The main symptoms of this condition are pain and a feeling of pressure around the affected sinuses. Other symptoms include:  Upper toothache.  Earache.  Headache.  Bad breath.  Decreased sense of smell and taste.  A cough that may get worse at night.  Fatigue.  Fever.  Thick drainage from your nose. The drainage is often green and it may contain pus (purulent).  Stuffy nose or congestion.  Postnasal drip. This is when extra mucus collects in the throat or back of the nose.  Swelling and warmth over the affected sinuses.  Sore throat.  Sensitivity to light.  How is this diagnosed? This condition is diagnosed based on symptoms, a medical history, and a physical exam. To find out if your condition is acute or chronic, your health care provider may:  Look in your nose for signs of nasal polyps.  Tap over the affected sinus to check for signs of infection.  View the inside of your sinuses using an imaging device that has a light attached (endoscope).  If your health care provider suspects  that you have chronic sinusitis, you may also:  Be tested for allergies.  Have a sample of mucus taken from your nose (nasal culture) and checked for bacteria.  Have a mucus sample examined to see if your sinusitis is related to an allergy.  If your sinusitis does not respond to treatment and it lasts longer than 8 weeks, you may have an MRI or CT scan to check your sinuses. These scans also help to determine how severe your infection is. In rare cases, a bone biopsy may be done to rule out more serious types of fungal sinus disease. How is this treated? Treatment for sinusitis depends on the cause and whether your condition is chronic or acute. If a virus is causing your sinusitis, your symptoms will go away on their own within 10 days. You may be given medicines to relieve your symptoms, including:  Topical nasal decongestants. They shrink swollen nasal passages and let mucus drain from your sinuses.  Antihistamines. These drugs block inflammation that is triggered by allergies. This can help to ease swelling in your nose and sinuses.  Topical nasal corticosteroids. These are nasal sprays that ease inflammation and swelling in your nose and sinuses.  Nasal saline washes. These rinses can help to get rid of thick mucus in your nose.  If your condition is caused by bacteria, you will  be given an antibiotic medicine. If your condition is caused by a fungus, you will be given an antifungal medicine. Surgery may be needed to correct underlying conditions, such as narrow nasal passages. Surgery may also be needed to remove polyps. Follow these instructions at home: Medicines  Take, use, or apply over-the-counter and prescription medicines only as told by your health care provider. These may include nasal sprays.  If you were prescribed an antibiotic medicine, take it as told by your health care provider. Do not stop taking the antibiotic even if you start to feel better. Hydrate and  Humidify  Drink enough water to keep your urine clear or pale yellow. Staying hydrated will help to thin your mucus.  Use a cool mist humidifier to keep the humidity level in your home above 50%.  Inhale steam for 10-15 minutes, 3-4 times a day or as told by your health care provider. You can do this in the bathroom while a hot shower is running.  Limit your exposure to cool or dry air. Rest  Rest as much as possible.  Sleep with your head raised (elevated).  Make sure to get enough sleep each night. General instructions  Apply a warm, moist washcloth to your face 3-4 times a day or as told by your health care provider. This will help with discomfort.  Wash your hands often with soap and water to reduce your exposure to viruses and other germs. If soap and water are not available, use hand sanitizer.  Do not smoke. Avoid being around people who are smoking (secondhand smoke).  Keep all follow-up visits as told by your health care provider. This is important. Contact a health care provider if:  You have a fever.  Your symptoms get worse.  Your symptoms do not improve within 10 days. Get help right away if:  You have a severe headache.  You have persistent vomiting.  You have pain or swelling around your face or eyes.  You have vision problems.  You develop confusion.  Your neck is stiff.  You have trouble breathing. This information is not intended to replace advice given to you by your health care provider. Make sure you discuss any questions you have with your health care provider. Document Released: 09/26/2005 Document Revised: 05/22/2016 Document Reviewed: 07/22/2015 Elsevier Interactive Patient Education  2017 [employer].   IF you received an x-ray today, you will receive an invoice from [facility]. Please contact [facility] at [phone] with questions or concerns regarding your invoice.   IF you received labwork today, you will  receive an invoice from [place]. Please contact [facility] at [phone] with questions or concerns regarding your invoice.   Our billing staff will not be able to assist you with questions regarding bills from these companies.  You will be contacted with the lab results as soon as they are available. The fastest way to get your results is to activate your My Chart account. Instructions are located on the last page of this paperwork. If you have not heard from [place] regarding the results in 2 weeks, please contact this office.

## 2017-03-28 NOTE — Progress Notes (Signed)
  MRN: [id number] DOB: June 25, 1981  Subjective:   Bobby Hobbs is a 36 y.o. male presenting for chief complaint of Nasal Congestion (x 1 week )  Reports 1 week history of sinus pressure, sinus congestion, post-nasal drainage, sore throat (worse in the morning). Has allergies, takes Claritin daily for this. Denies fever, sinus pain, ear pain, ear drainage, cough, chest pain, shob, wheezing, n/v, abdominal pain. Denies smoking cigarettes.  Bobby Hobbs has a current medication list which includes the following prescription(s): ibuprofen and loratadine. Also is allergic to dairy aid [lactase] and hycodan [hydrocodone-homatropine]. Bobby Hobbs  has a past medical history of Allergy. Also  has a past surgical history that includes laparoscopic appendectomy (N/A, 12/28/2014).  Objective:   Vitals: BP 111/74   Pulse 77   Temp 98.6 F (37 C) (Oral)   Resp 16   Ht 6' (1.829 m)   Wt 218 lb 12.8 oz (99.2 kg)   SpO2 96%   BMI 29.67 kg/m   Physical Exam  Constitutional: He is oriented to person, place, and time. He appears well-developed and well-nourished.  HENT:  TM's intact bilaterally, no effusions or erythema. Nasal turbinates erythematous, nasal passages minimally patent. Left-sided maxillary sinus tenderness. Throat with thick streaks of post-nasal drainage, mucous membranes moist.  Eyes: Right eye exhibits no discharge. Left eye exhibits no discharge.  Neck: Normal range of motion. Neck supple.  Cardiovascular: Normal rate, regular rhythm and intact distal pulses.  Exam reveals no gallop and no friction rub.   No murmur heard. Pulmonary/Chest: No respiratory distress. He has no wheezes. He has no rales.  Lymphadenopathy:    He has no cervical adenopathy.  Neurological: He is alert and oriented to person, place, and time.  Skin: Skin is warm and dry.  Psychiatric: He has a normal mood and affect.   Assessment and Plan :   1. Allergic rhinitis due to other allergic trigger,  unspecified seasonality - Maintain Claritin, add Sudafed as needed for post-nasal drainage.  - montelukast (SINGULAIR) 10 MG tablet; Take 1 tablet (10 mg total) by mouth at bedtime.  Dispense: 30 tablet; Refill: 3  2. Acute non-recurrent sinusitis of other sinus 3. Post-nasal drainage - Will cover for bacterial sinusitis secondary to allergic rhinitis. Patient's insurance did not cover Augmentin, will switch to amoxicillin TID. Return-to-clinic precautions discussed, patient verbalized understanding.   Jaynee Eagles, PA-C [facility] at [facility] [phone] 03/28/2017  11:52 AM

## 2017-04-07 ENCOUNTER — Ambulatory Visit (INDEPENDENT_AMBULATORY_CARE_PROVIDER_SITE_OTHER): Payer: 59 | Admitting: Urgent Care

## 2017-04-07 ENCOUNTER — Encounter: Payer: Self-pay | Admitting: Urgent Care

## 2017-04-07 VITALS — BP 115/77 | HR 72 | Temp 98.3°F | Resp 16 | Ht 72.0 in | Wt 218.0 lb

## 2017-04-07 DIAGNOSIS — R490 Dysphonia: Secondary | ICD-10-CM

## 2017-04-07 DIAGNOSIS — J3089 Other allergic rhinitis: Secondary | ICD-10-CM

## 2017-04-07 DIAGNOSIS — R0982 Postnasal drip: Secondary | ICD-10-CM

## 2017-04-07 DIAGNOSIS — J029 Acute pharyngitis, unspecified: Secondary | ICD-10-CM | POA: Diagnosis not present

## 2017-04-07 LAB — POCT RAPID STREP A (OFFICE): RAPID STREP A SCREEN: NEGATIVE

## 2017-04-07 MED ORDER — METHYLPREDNISOLONE ACETATE 80 MG/ML IJ SUSP
80.0000 mg | Freq: Once | INTRAMUSCULAR | Status: AC
  Administered 2017-04-07: 80 mg via INTRAMUSCULAR

## 2017-04-07 NOTE — Progress Notes (Signed)
  MRN: [id number] DOB: 01/29/1981  Subjective:   Bobby Hobbs is a 36 y.o. male presenting for chief complaint of Sore Throat (X 3 days )  Reports 3 day history of sore throat, hoarseness, mild dry cough. Has nasal congestion still but feels his sinusitis is resolved from previous visit. He did finish the course of amoxicillin for this. Patient is maintaining his Claritin, Singulair, uses Sudafed as needed. He is unable to use nasal steroid due to adverse reaction. Denies fever, sinus pain, ear pain, chest pain, shob, wheezing, n/v, abdominal pain, rashes. Denies smoking cigarettes.   Bobby Hobbs has a current medication list which includes the following prescription(s): loratadine, montelukast, and pseudoephedrine. Also is allergic to dairy aid [lactase] and hycodan [hydrocodone-homatropine]. Bobby Hobbs  has a past medical history of Allergy. Also  has a past surgical history that includes laparoscopic appendectomy (N/A, 12/28/2014).  Objective:   Vitals: BP 115/77   Pulse 72   Temp 98.3 F (36.8 C) (Oral)   Resp 16   Ht 6' (1.829 m)   Wt 218 lb (98.9 kg)   SpO2 97%   BMI 29.57 kg/m   Physical Exam  Constitutional: He is oriented to person, place, and time. He appears well-developed and well-nourished.  HENT:  TM's intact bilaterally, no effusions or erythema. Nasal turbinates pink and moist, nasal passages patent. No sinus tenderness. Oropharynx with significant post-nasal drainage, mucous membranes moist.  Eyes: Right eye exhibits no discharge. Left eye exhibits no discharge.  Neck: Normal range of motion. Neck supple.  Cardiovascular: Normal rate, regular rhythm and intact distal pulses.  Exam reveals no gallop and no friction rub.   No murmur heard. Pulmonary/Chest: No respiratory distress. He has no wheezes. He has no rales.  Lymphadenopathy:    He has no cervical adenopathy.  Neurological: He is alert and oriented to person, place, and time.  Skin: Skin is warm  and dry.   Results for orders placed or performed in visit on 04/07/17 (from the past 24 hour(s))  POCT rapid strep A     Status: None   Collection Time: 04/07/17 10:49 AM  Result Value Ref Range   Rapid Strep A Screen Negative Negative   Assessment and Plan :   1. Sore throat 2. Hoarseness of voice 3. Post-nasal drainage 4. Non-seasonal allergic rhinitis due to other allergic trigger - Strep culture pending. Hydrate better, maintain allergy medications. Will use IM Depo-Medrol today.   Jaynee Eagles, PA-C [facility] [place] [phone] 04/07/2017  10:52 AM

## 2017-04-07 NOTE — Patient Instructions (Addendum)
Methylprednisolone Suspension for Injection What is this medicine? METHYLPREDNISOLONE (meth ill pred NISS oh lone) is a corticosteroid. It is commonly used to treat inflammation of the skin, joints, lungs, and other organs. Common conditions treated include asthma, allergies, and arthritis. It is also used for other conditions, such as blood disorders and diseases of the adrenal glands. This medicine may be used for other purposes; ask your health care provider or pharmacist if you have questions. COMMON BRAND NAME(S): Depmedalone-40, Depmedalone-80, Depo-Medrol What should I tell my health care provider before I take this medicine? They need to know if you have any of these conditions: -Cushing's syndrome -eye disease, vision problems -diabetes -glaucoma -heart disease -high blood pressure -infection (especially a virus infection such as chickenpox, cold sores, or herpes) -liver disease -mental illness -myasthenia gravis -osteoporosis -recently received or scheduled to receive a vaccine -seizures -stomach or intestine problems -thyroid disease -an unusual or allergic reaction to lactose, methylprednisolone, other medicines, foods, dyes, or preservatives -pregnant or trying to get pregnant -breast-feeding How should I use this medicine? This medicine is for injection into a muscle, joint, or other tissue. It is given by a health care professional in a hospital or clinic setting. Talk to your pediatrician regarding the use of this medicine in children. While this drug may be prescribed for selected conditions, precautions do apply. Overdosage: If you think you have taken too much of this medicine contact a poison control center or emergency room at once. NOTE: This medicine is only for you. Do not share this medicine with others. What if I miss a dose? This does not apply. What may interact with this medicine? Do not take this medicine with any of the following  medications: -alefacept -echinacea -iopamidol -live virus vaccines -metyrapone -mifepristone This medicine may also interact with the following medications: -amphotericin B -aspirin and aspirin-like medicines -certain antibiotics like erythromycin, clarithromycin, troleandomycin -certain medicines for diabetes -certain medicines for fungal infections like ketoconazole -certain medicines for seizures like carbamazepine, phenobarbital, phenytoin -certain medicines that treat or prevent blood clots like warfarin -cyclosporine -digoxin -diuretics -male hormones, like estrogens and birth control pills -isoniazid -NSAIDs, medicines for pain inflammation, like ibuprofen or naproxen -other medicines for myasthenia gravis -rifampin -vaccines This list may not describe all possible interactions. Give your health care provider a list of all the medicines, herbs, non-prescription drugs, or dietary supplements you use. Also tell them if you smoke, drink alcohol, or use illegal drugs. Some items may interact with your medicine. What should I watch for while using this medicine? Tell your doctor or healthcare professional if your symptoms do not start to get better or if they get worse. Do not stop taking except on your doctor's advice. You may develop a severe reaction. Your doctor will tell you how much medicine to take. Your condition will be monitored carefully while you are receiving this medicine. This medicine may increase your risk of getting an infection. Tell your doctor or health care professional if you are around anyone with measles or chickenpox, or if you develop sores or blisters that do not heal properly. This medicine may affect blood sugar levels. If you have diabetes, check with your doctor or health care professional before you change your diet or the dose of your diabetic medicine. Tell your doctor or health care professional right away if you have any change in your  eyesight. Using this medicine for a long time may increase your risk of low bone mass. Talk to your doctor about   bone health. What side effects may I notice from receiving this medicine? Side effects that you should report to your doctor or health care professional as soon as possible: -allergic reactions like skin rash, itching or hives, swelling of the face, lips, or tongue -bloody or tarry stools -changes in vision -hallucination, loss of contact with reality -muscle cramps -muscle pain -palpitations -signs and symptoms of high blood sugar such as dizziness; dry mouth; dry skin; fruity breath; nausea; stomach pain; increased hunger or thirst; increased urination -signs and symptoms of infection like fever or chills; cough; sore throat; pain or trouble passing urine -trouble passing urine or change in the amount of urine Side effects that usually do not require medical attention (report to your doctor or health care professional if they continue or are bothersome): -changes in emotions or mood -constipation -diarrhea -excessive hair growth on the face or body -headache -nausea, vomiting -pain, redness, or irritation at site where injected -trouble sleeping -weight gain This list may not describe all possible side effects. Call your doctor for medical advice about side effects. You may report side effects to FDA at 1-800-FDA-1088. Where should I keep my medicine? This drug is given in a hospital or clinic and will not be stored at home. NOTE: This sheet is a summary. It may not cover all possible information. If you have questions about this medicine, talk to your doctor, pharmacist, or health care provider.  2018 Elsevier/Gold Standard (2015-12-03 16:17:02)     IF you received an x-ray today, you will receive an invoice from [facility]. Please contact [facility] at [phone] with questions or concerns regarding your invoice.   IF you received labwork  today, you will receive an invoice from [facility]. Please contact [facility] at [phone] with questions or concerns regarding your invoice.   Our billing staff will not be able to assist you with questions regarding bills from these companies.  You will be contacted with the lab results as soon as they are available. The fastest way to get your results is to activate your My Chart account. Instructions are located on the last page of this paperwork. If you have not heard from [place] regarding the results in 2 weeks, please contact this office.

## 2017-04-10 LAB — CULTURE, GROUP A STREP: Strep A Culture: NEGATIVE

## 2017-06-20 ENCOUNTER — Ambulatory Visit (INDEPENDENT_AMBULATORY_CARE_PROVIDER_SITE_OTHER): Payer: 59 | Admitting: Family Medicine

## 2017-06-20 ENCOUNTER — Encounter: Payer: Self-pay | Admitting: Family Medicine

## 2017-06-20 VITALS — BP 128/85 | HR 68 | Temp 98.0°F | Resp 16 | Ht 72.0 in | Wt 217.0 lb

## 2017-06-20 DIAGNOSIS — M79605 Pain in left leg: Secondary | ICD-10-CM | POA: Diagnosis not present

## 2017-06-20 DIAGNOSIS — S7012XA Contusion of left thigh, initial encounter: Secondary | ICD-10-CM

## 2017-06-20 NOTE — Patient Instructions (Addendum)
Ice to affected area if needed, tylenol as needed. Pain should improve this week into next.  Return to the clinic or go to the nearest emergency room if any of your symptoms worsen or new symptoms occur.   Contusion A contusion is a deep bruise. Contusions are the result of a blunt injury to tissues and muscle fibers under the skin. The injury causes bleeding under the skin. The skin overlying the contusion may turn blue, purple, or yellow. Minor injuries will give you a painless contusion, but more severe contusions may stay painful and swollen for a few weeks. What are the causes? This condition is usually caused by a blow, trauma, or direct force to an area of the body. What are the signs or symptoms? Symptoms of this condition include:  Swelling of the injured area.  Pain and tenderness in the injured area.  Discoloration. The area may have redness and then turn blue, purple, or yellow.  How is this diagnosed? This condition is diagnosed based on a physical exam and medical history. An X-ray, CT scan, or MRI may be needed to determine if there are any associated injuries, such as broken bones (fractures). How is this treated? Specific treatment for this condition depends on what area of the body was injured. In general, the best treatment for a contusion is resting, icing, applying pressure to (compression), and elevating the injured area. This is often called the RICE strategy. Over-the-counter anti-inflammatory medicines may also be recommended for pain control. Follow these instructions at home:  Rest the injured area.  If directed, apply ice to the injured area: ? Put ice in a plastic bag. ? Place a towel between your skin and the bag. ? Leave the ice on for 20 minutes, 2-3 times per day.  If directed, apply light compression to the injured area using an elastic bandage. Make sure the bandage is not wrapped too tightly. Remove and reapply the bandage as directed by your health  care provider.  If possible, raise (elevate) the injured area above the level of your heart while you are sitting or lying down.  Take over-the-counter and prescription medicines only as told by your health care provider. Contact a health care provider if:  Your symptoms do not improve after several days of treatment.  Your symptoms get worse.  You have difficulty moving the injured area. Get help right away if:  You have severe pain.  You have numbness in a hand or foot.  Your hand or foot turns pale or cold. This information is not intended to replace advice given to you by your health care provider. Make sure you discuss any questions you have with your health care provider. Document Released: 07/06/2005 Document Revised: 02/04/2016 Document Reviewed: 02/11/2015 Elsevier Interactive Patient Education  2017 [employer].  IF you received an x-ray today, you will receive an invoice from [facility]. Please contact [facility] at [phone] with questions or concerns regarding your invoice.   IF you received labwork today, you will receive an invoice from [place]. Please contact [facility] at [phone] with questions or concerns regarding your invoice.   Our billing staff will not be able to assist you with questions regarding bills from these companies.  You will be contacted with the lab results as soon as they are available. The fastest way to get your results is to activate your My Chart account. Instructions are located on the last page of this paperwork. If you have not heard from [place] regarding  the results in 2 weeks, please contact this office.    '

## 2017-06-20 NOTE — Progress Notes (Signed)
Subjective:    Patient ID: [place], male    DOB: 08-02-1981, 36 y.o.   MRN: [id number]  HPI [place] is a 36 y.o. male Presents today for: Chief Complaint  Patient presents with  . Thigh Pain    bandged against entertainment stand on Sunday   2 nights ago - walking at home, moving out of way of dog and slammed left thigh into entertainment center. Hurts to walk, but able to weight bear. Bruising noted, but no skin tear/wound. Has had some pain in outside of upper left thigh and some soreness above knee. No bone pain in knee and able to bend ok. No blood thinners.   Tx: advil, ice. advil lessened pain some.   Patient Active Problem List   Diagnosis Date Noted  . Eyelid dermatitis, allergic/contact 09/16/2015  . Rhinitis, allergic 09/16/2015  . Acute gangrenous appendicitis with perforation s/p lap appy 12/28/2014 12/28/2014  . Dental caries 06/03/2013   Past Medical History:  Diagnosis Date  . Allergy    Past Surgical History:  Procedure Laterality Date  . LAPAROSCOPIC APPENDECTOMY N/A 12/28/2014   Procedure: APPENDECTOMY LAPAROSCOPIC;  Surgeon: Armandina Gemma, MD;  Location: [facility];  Service: General;  Laterality: N/A;   Allergies  Allergen Reactions  . Dairy Aid [Lactase]   . Hycodan [Hydrocodone-Homatropine] Nausea And Vomiting    Tolerates hydrocodone with acetaminophen   Prior to Admission medications   Medication Sig Start Date End Date Taking? Authorizing Provider  loratadine (CLARITIN) 10 MG tablet Take 10 mg by mouth daily.   Yes [provider]  montelukast (SINGULAIR) 10 MG tablet Take 1 tablet (10 mg total) by mouth at bedtime. 03/28/17  Yes Jaynee Eagles, PA-C  pseudoephedrine (SUDAFED 12 HOUR) 120 MG 12 hr tablet Take 1 tablet (120 mg total) by mouth 2 (two) times daily. Patient not taking: Reported on 06/20/2017 03/28/17   Jaynee Eagles, PA-C   Social History   Social History  . Marital status: Married    Spouse name: N/A  .  Number of children: N/A  . Years of education: N/A   Occupational History  . Not on file.   Social History Main Topics  . Smoking status: Never Smoker  . Smokeless tobacco: Never Used  . Alcohol use 0.0 oz/week     Comment: once a year  . Drug use: No  . Sexual activity: Yes   Other Topics Concern  . Not on file   Social History Narrative  . No narrative on file    Review of Systems  Musculoskeletal: Positive for gait problem (limping d/t pain. ) and myalgias. Negative for joint swelling.  Skin: Positive for color change. Negative for wound.  Neurological: Negative for weakness.        Objective:   Physical Exam  Constitutional: He appears well-developed and well-nourished. No distress.  Cardiovascular: Normal rate.   Pulmonary/Chest: Effort normal.  Musculoskeletal: Normal range of motion.       Left hip: He exhibits normal range of motion, normal strength and no bony tenderness.       Left knee: He exhibits normal range of motion, no swelling, no effusion and no bony tenderness. No tenderness found.       Left upper leg: He exhibits tenderness.       Legs: Vitals reviewed.   Vitals:   06/20/17 0936  BP: 128/85  Pulse: 68  Resp: 16  Temp: 98 F (36.7 C)  TempSrc: Oral  SpO2: 97%  Weight: 217 lb (98.4 kg)  Height: 6' (1.829 m)       Assessment & Plan:   [place] is a 36 y.o. male Contusion of left thigh, initial encounter  Left leg pain  Contusion of thigh, no bony ttp, lower quad strain vs change in gait with upper thigh pain.   -symptomatic care, no indication for XR at this time. rtc precautions, note for work given.   No orders of the defined types were placed in this encounter.  Patient Instructions    Ice to affected area if needed, tylenol as needed. Pain should improve this week into next.  Return to the clinic or go to the nearest emergency room if any of your symptoms worsen or new symptoms occur.   Contusion A  contusion is a deep bruise. Contusions are the result of a blunt injury to tissues and muscle fibers under the skin. The injury causes bleeding under the skin. The skin overlying the contusion may turn blue, purple, or yellow. Minor injuries will give you a painless contusion, but more severe contusions may stay painful and swollen for a few weeks. What are the causes? This condition is usually caused by a blow, trauma, or direct force to an area of the body. What are the signs or symptoms? Symptoms of this condition include:  Swelling of the injured area.  Pain and tenderness in the injured area.  Discoloration. The area may have redness and then turn blue, purple, or yellow.  How is this diagnosed? This condition is diagnosed based on a physical exam and medical history. An X-ray, CT scan, or MRI may be needed to determine if there are any associated injuries, such as broken bones (fractures). How is this treated? Specific treatment for this condition depends on what area of the body was injured. In general, the best treatment for a contusion is resting, icing, applying pressure to (compression), and elevating the injured area. This is often called the RICE strategy. Over-the-counter anti-inflammatory medicines may also be recommended for pain control. Follow these instructions at home:  Rest the injured area.  If directed, apply ice to the injured area: ? Put ice in a plastic bag. ? Place a towel between your skin and the bag. ? Leave the ice on for 20 minutes, 2-3 times per day.  If directed, apply light compression to the injured area using an elastic bandage. Make sure the bandage is not wrapped too tightly. Remove and reapply the bandage as directed by your health care provider.  If possible, raise (elevate) the injured area above the level of your heart while you are sitting or lying down.  Take over-the-counter and prescription medicines only as told by your health care  provider. Contact a health care provider if:  Your symptoms do not improve after several days of treatment.  Your symptoms get worse.  You have difficulty moving the injured area. Get help right away if:  You have severe pain.  You have numbness in a hand or foot.  Your hand or foot turns pale or cold. This information is not intended to replace advice given to you by your health care provider. Make sure you discuss any questions you have with your health care provider. Document Released: 07/06/2005 Document Revised: 02/04/2016 Document Reviewed: 02/11/2015 Elsevier Interactive Patient Education  2017 [employer].  IF you received an x-ray today, you will receive an invoice from [facility]. Please contact [facility] at [phone] with questions or concerns regarding  your invoice.   IF you received labwork today, you will receive an invoice from [place]. Please contact [facility] at [phone] with questions or concerns regarding your invoice.   Our billing staff will not be able to assist you with questions regarding bills from these companies.  You will be contacted with the lab results as soon as they are available. The fastest way to get your results is to activate your My Chart account. Instructions are located on the last page of this paperwork. If you have not heard from [place] regarding the results in 2 weeks, please contact this office.    ' Signed,   Merri Ray, MD [facility] [place].  06/20/17 10:23 AM

## 2017-07-12 ENCOUNTER — Encounter: Payer: Self-pay | Admitting: Physician Assistant

## 2017-07-12 ENCOUNTER — Ambulatory Visit (INDEPENDENT_AMBULATORY_CARE_PROVIDER_SITE_OTHER): Payer: 59 | Admitting: Physician Assistant

## 2017-07-12 VITALS — BP 110/72 | HR 90 | Temp 97.6°F | Resp 18 | Ht 72.44 in | Wt 221.6 lb

## 2017-07-12 DIAGNOSIS — J029 Acute pharyngitis, unspecified: Secondary | ICD-10-CM

## 2017-07-12 LAB — POCT RAPID STREP A (OFFICE): RAPID STREP A SCREEN: NEGATIVE

## 2017-07-12 MED ORDER — AMOXICILLIN 500 MG PO CAPS: 500.0000 mg | ORAL_CAPSULE | Freq: Two times a day (BID) | ORAL | 0 refills | Status: AC

## 2017-07-12 NOTE — Progress Notes (Signed)
MRN: [id number] DOB: 02/02/1981  Subjective:   Bobby Hobbs is a 36 y.o. male presenting for chief complaint of Sore Throat and Cough .  Reports 1 day history of sore throat.  Has also had a dry cough has been going on for a week but notes this is typical for his allergies. Has tried Singulair and Claritin with no full relief. Denies difficulty swallowing, fever, sinus congestion, rhinorrhea, ear pain, wheezing, shortness of breath, chest tightness and myalgia, chills, fatigue, nausea, vomiting, abdominal pain and diarrhea. Has had sick contact with wife, who was just diagnosed with strep throat two days ago. Admits to kissing her before her diagnosis. Has history of seasonal allergies. No history of asthma. Patient has not had flu shot this season. Denies smoking. Denies any other aggravating or relieving factors, no other questions or concerns.  [place] has a current medication list which includes the following prescription(s): loratadine, montelukast, and amoxicillin. Also is allergic to dairy aid [lactase] and hycodan [hydrocodone-homatropine].  Frank  has a past medical history of Allergy. Also  has a past surgical history that includes laparoscopic appendectomy (N/A, 12/28/2014).   Objective:   Vitals: BP 110/72 (BP Location: Left Arm, Patient Position: Sitting, Cuff Size: Large)   Pulse 90   Temp 97.6 F (36.4 C) (Oral)   Resp 18   Ht 6' 0.44" (1.84 m)   Wt 221 lb 9.6 oz (100.5 kg)   SpO2 97%   BMI 29.69 kg/m   Physical Exam  Constitutional: He is oriented to person, place, and time. He appears well-developed and well-nourished. He appears distressed (appears uncomfortable sitting on exam table).  HENT:  Head: Normocephalic and atraumatic.  Right Ear: Tympanic membrane, external ear and ear canal normal.  Left Ear: Tympanic membrane, external ear and ear canal normal.  Nose: Mucosal edema (mild bilaterally) present. Right sinus exhibits no maxillary  sinus tenderness and no frontal sinus tenderness. Left sinus exhibits no maxillary sinus tenderness and no frontal sinus tenderness.  Mouth/Throat: Uvula is midline and mucous membranes are normal. Posterior oropharyngeal erythema present. Tonsils are 2+ on the right. Tonsils are 2+ on the left. No tonsillar exudate.  Eyes: Conjunctivae are normal.  Neck: Normal range of motion.  Cardiovascular: Normal rate, regular rhythm and normal heart sounds.   Pulmonary/Chest: Effort normal. He has no decreased breath sounds. He has no wheezes. He has no rhonchi. He has no rales.  Lymphadenopathy:       Head (right side): No submental, no submandibular, no tonsillar, no preauricular, no posterior auricular and no occipital adenopathy present.       Head (left side): No submental, no submandibular, no tonsillar, no preauricular, no posterior auricular and no occipital adenopathy present.    He has no cervical adenopathy.       Right: No supraclavicular adenopathy present.       Left: No supraclavicular adenopathy present.  Neurological: He is alert and oriented to person, place, and time.  Skin: Skin is warm and dry.  Psychiatric: He has a normal mood and affect.  Vitals reviewed.   Results for orders placed or performed in visit on 07/12/17 (from the past 24 hour(s))  POCT rapid strep A     Status: None   Collection Time: 07/12/17  4:44 PM  Result Value Ref Range   Rapid Strep A Screen Negative Negative    Assessment and Plan :  1. Sore throat Due to recent exposure to strep and sudden onset of sore  throat, will treat empirically for strep throat. Pt encouraged to eat soft foods and continue oral hydration. Return to clinic if symptoms worsen, do not improve, or as needed. - POCT rapid strep A - amoxicillin (AMOXIL) 500 MG capsule; Take 1 capsule (500 mg total) by mouth 2 (two) times daily.  Dispense: 20 capsule; Refill: 0  Tenna Delaine, PA-C  [facility] at [place] 07/12/2017 5:13 PM

## 2017-07-12 NOTE — Patient Instructions (Addendum)
Due to recent exposure to strep, we will treat empirically for strep throat. Please take antibiotic as prescribed. Return to clinic if symptoms worsen, do not improve, or as needed. Thank you for letting me participate in your health and well being.  Sore Throat When you have a sore throat, your throat may:  Hurt.  Burn.  Feel irritated.  Feel scratchy.  Many things can cause a sore throat, including:  An infection.  Allergies.  Dryness in the air.  Smoke or pollution.  Gastroesophageal reflux disease (GERD).  A tumor.  A sore throat can be the first sign of another sickness. It can happen with other problems, like coughing or a fever. Most sore throats go away without treatment. Follow these instructions at home:  Take over-the-counter medicines only as told by your doctor.  Drink enough fluids to keep your pee (urine) clear or pale yellow.  Rest when you feel you need to.  To help with pain, try: ? Sipping warm liquids, such as broth, herbal tea, or warm water. ? Eating or drinking cold or frozen liquids, such as frozen ice pops. ? Gargling with a salt-water mixture 3-4 times a day or as needed. To make a salt-water mixture, add -1 tsp of salt in 1 cup of warm water. Mix it until you cannot see the salt anymore. ? Sucking on hard candy or throat lozenges. ? Putting a cool-mist humidifier in your bedroom at night. ? Sitting in the bathroom with the door closed for 5-10 minutes while you run hot water in the shower.  Do not use any tobacco products, such as cigarettes, chewing tobacco, and e-cigarettes. If you need help quitting, ask your doctor. Contact a doctor if:  You have a fever for more than 2-3 days.  You keep having symptoms for more than 2-3 days.  Your throat does not get better in 7 days.  You have a fever and your symptoms suddenly get worse. Get help right away if:  You have trouble breathing.  You cannot swallow fluids, soft foods, or your  saliva.  You have swelling in your throat or neck that gets worse.  You keep feeling like you are going to throw up (vomit).  You keep throwing up. This information is not intended to replace advice given to you by your health care provider. Make sure you discuss any questions you have with your health care provider. Document Released: 07/05/2008 Document Revised: 05/22/2016 Document Reviewed: 07/17/2015 Elsevier Interactive Patient Education  2018 [employer].   IF you received an x-ray today, you will receive an invoice from [facility]. Please contact [facility] at [phone] with questions or concerns regarding your invoice.   IF you received labwork today, you will receive an invoice from [place]. Please contact [facility] at [phone] with questions or concerns regarding your invoice.   Our billing staff will not be able to assist you with questions regarding bills from these companies.  You will be contacted with the lab results as soon as they are available. The fastest way to get your results is to activate your My Chart account. Instructions are located on the last page of this paperwork. If you have not heard from [place] regarding the results in 2 weeks, please contact this office.

## 2017-07-12 NOTE — Progress Notes (Deleted)
Bobby Hobbs is a 35 y.o. male presenting with a sore throat for *** days.  Associated symptoms include:  {Symptoms; Sore Throat:210950037}.  Symptoms are {Frequency of symptoms:210950038}.  Home treatment thus far includes:  {Sore Throat-Home Treatment:210950039}.  {Description-No Known / Known:210950040} sick contacts with similar symptoms.  {History-no history:12543} of similar symptoms.  Exam:  BP 110/72 (BP Location: Left Arm, Patient Position: Sitting, Cuff Size: Large)   Pulse 90   Temp 97.6 F (36.4 C) (Oral)   Resp 18   Ht 6' 0.44" (1.84 m)   Wt 221 lb 9.6 oz (100.5 kg)   SpO2 97%   BMI 29.69 kg/m  Constitutional *** HEENT *** Neck *** Heart *** Lungs *** Skin ***

## 2017-07-17 ENCOUNTER — Ambulatory Visit (INDEPENDENT_AMBULATORY_CARE_PROVIDER_SITE_OTHER): Payer: 59 | Admitting: Physician Assistant

## 2017-07-17 ENCOUNTER — Encounter: Payer: Self-pay | Admitting: Physician Assistant

## 2017-07-17 VITALS — BP 112/78 | HR 71 | Temp 97.4°F | Ht 72.72 in | Wt 220.8 lb

## 2017-07-17 DIAGNOSIS — R05 Cough: Secondary | ICD-10-CM | POA: Diagnosis not present

## 2017-07-17 DIAGNOSIS — J029 Acute pharyngitis, unspecified: Secondary | ICD-10-CM | POA: Diagnosis not present

## 2017-07-17 DIAGNOSIS — R059 Cough, unspecified: Secondary | ICD-10-CM

## 2017-07-17 MED ORDER — DIPHENHYDRAMINE-PHENYLEPHRINE 6.25-2.5 MG/5ML PO LIQD: 10.0000 mL | Freq: Two times a day (BID) | ORAL | 0 refills | Status: DC | PRN

## 2017-07-17 MED ORDER — BENZONATATE 100 MG PO CAPS: 100.0000 mg | ORAL_CAPSULE | Freq: Three times a day (TID) | ORAL | 0 refills | Status: DC | PRN

## 2017-07-17 NOTE — Patient Instructions (Addendum)
- We will treat this as a respiratory viral infection.  - I recommend you rest, drink plenty of fluids, eat light meals including soups.  - You may use cough syrup at night for your cough and sore throat, Tessalon pearls during the day. Be aware that cough syrup can definitely make you drowsy and sleepy so do not drive or operate any heavy machinery if it is affecting you during the day.  - You may also use Tylenol or ibuprofen over-the-counter for your sore throat.  Continue taking amoxicillin until it is complete. - Please let me know if you are not seeing any improvement or get worse in 3-5 days as we may need to do imaging your chest at that time.   Cough, Adult A cough helps to clear your throat and lungs. A cough may last only 2-3 weeks (acute), or it may last longer than 8 weeks (chronic). Many different things can cause a cough. A cough may be a sign of an illness or another medical condition. Follow these instructions at home:  Pay attention to any changes in your cough.  Take medicines only as told by your doctor. ? If you were prescribed an antibiotic medicine, take it as told by your doctor. Do not stop taking it even if you start to feel better. ? Talk with your doctor before you try using a cough medicine.  Drink enough fluid to keep your pee (urine) clear or pale yellow.  If the air is dry, use a cold steam vaporizer or humidifier in your home.  Stay away from things that make you cough at work or at home.  If your cough is worse at night, try using extra pillows to raise your head up higher while you sleep.  Do not smoke, and try not to be around smoke. If you need help quitting, ask your doctor.  Do not have caffeine.  Do not drink alcohol.  Rest as needed. Contact a doctor if:  You have new problems (symptoms).  You cough up yellow fluid (pus).  Your cough does not get better after 2-3 weeks, or your cough gets worse.  Medicine does not help your cough and  you are not sleeping well.  You have pain that gets worse or pain that is not helped with medicine.  You have a fever.  You are losing weight and you do not know why.  You have night sweats. Get help right away if:  You cough up blood.  You have trouble breathing.  Your heartbeat is very fast. This information is not intended to replace advice given to you by your health care provider. Make sure you discuss any questions you have with your health care provider. Document Released: 06/09/2011 Document Revised: 03/03/2016 Document Reviewed: 12/03/2014 Elsevier Interactive Patient Education  2018 [employer].   IF you received an x-ray today, you will receive an invoice from [facility]. Please contact [facility] at [phone] with questions or concerns regarding your invoice.   IF you received labwork today, you will receive an invoice from [place]. Please contact [facility] at [phone] with questions or concerns regarding your invoice.   Our billing staff will not be able to assist you with questions regarding bills from these companies.  You will be contacted with the lab results as soon as they are available. The fastest way to get your results is to activate your My Chart account. Instructions are located on the last page of this paperwork. If you have  not heard from [place] regarding the results in 2 weeks, please contact this office.

## 2017-07-17 NOTE — Progress Notes (Signed)
MRN: [id number] DOB: 10-10-1981  Subjective:   Bobby Hobbs is a 36 y.o. male presenting for follow up on cough and sore throat. Pt was initially seen on 07/12/17 for sore throat and intermittent dry cough. He had been exposed to strep from his wife and was treated empirically with Amoxicillin. He has been taking singulair, claritin, and abx daily. Has tried mucinex with only mild relief. Has not tried any cough syrup. Notes the sore throat has gotten better but the dry cough has persisted. His allergies typically make him cough but this is worse than usual. Denies fever, nigh sweats, hemoptysis, wheezing, and SOB. Denies smoking or rect travel. No hx of asthma.  Bobby Hobbs has a current medication list which includes the following prescription(s): amoxicillin, loratadine, and montelukast. Also is allergic to dairy aid [lactase] and hycodan [hydrocodone-homatropine].  Bobby Hobbs  has a past medical history of Allergy. Also  has a past surgical history that includes laparoscopic appendectomy (N/A, 12/28/2014).   Objective:   Vitals: BP 112/78 (BP Location: Right Arm, Patient Position: Sitting, Cuff Size: Normal)   Pulse 71   Temp (!) 97.4 F (36.3 C) (Oral)   Ht 6' 0.72" (1.847 m)   Wt 220 lb 12.8 oz (100.2 kg)   SpO2 99%   BMI 29.36 kg/m   Physical Exam  Constitutional: He is oriented to person, place, and time. He appears well-developed and well-nourished. No distress.  HENT:  Head: Normocephalic and atraumatic.  Right Ear: Tympanic membrane, external ear and ear canal normal.  Left Ear: Tympanic membrane, external ear and ear canal normal.  Nose: Mucosal edema (mild bilaterally) present. Right sinus exhibits no maxillary sinus tenderness and no frontal sinus tenderness. Left sinus exhibits no maxillary sinus tenderness and no frontal sinus tenderness.  Mouth/Throat: Uvula is midline and mucous membranes are normal. Posterior oropharyngeal erythema (cobblestoning noted )  present. Tonsils are 1+ on the right. Tonsils are 1+ on the left.  Eyes: Conjunctivae are normal.  Neck: Normal range of motion.  Cardiovascular: Normal rate, regular rhythm and normal heart sounds.   Pulmonary/Chest: Effort normal. No accessory muscle usage. No tachypnea. No respiratory distress. He has no decreased breath sounds. He has no wheezes. He has no rhonchi. He has no rales.  Lymphadenopathy:       Head (right side): No submental, no submandibular, no tonsillar, no preauricular, no posterior auricular and no occipital adenopathy present.       Head (left side): No submental, no submandibular, no tonsillar, no preauricular, no posterior auricular and no occipital adenopathy present.    He has no cervical adenopathy.       Right: No supraclavicular adenopathy present.       Left: No supraclavicular adenopathy present.  Neurological: He is alert and oriented to person, place, and time.  Skin: Skin is warm and dry.  Psychiatric: He has a normal mood and affect.  Vitals reviewed.  No results found for this or any previous visit (from the past 24 hour(s)).  Assessment and Plan :  1. Cough Likely acute URI. Vitals stable. No acute findings on PE. Will give symptomatic tx at this time with close follow up. Continue allergy meds and amoxicillin as prescribed. If no improvement in 3-5 days, encouraged to return to clinic. Consider CXR at that time if cough persists.  - benzonatate (TESSALON) 100 MG capsule; Take 1-2 capsules (100-200 mg total) by mouth 3 (three) times daily as needed for cough.  Dispense: 40 capsule; Refill: 0 -  Diphenhydramine-Phenylephrine (DELSYM NIGHT TIME COUGH/COLD) 6.25-2.5 MG/5ML LIQD; Take 10 mLs by mouth 2 (two) times daily as needed.  Dispense: 118 mL; Refill: 0  2. Sore throat Improving, continue amoxicillin until it is complete.   Tenna Delaine, PA-C  [facility] at [facility] 07/17/2017 12:34 PM

## 2017-08-09 ENCOUNTER — Ambulatory Visit (INDEPENDENT_AMBULATORY_CARE_PROVIDER_SITE_OTHER): Payer: 59 | Admitting: Physician Assistant

## 2017-08-09 ENCOUNTER — Encounter: Payer: Self-pay | Admitting: Physician Assistant

## 2017-08-09 VITALS — BP 112/72 | HR 96 | Temp 98.9°F | Resp 18 | Ht 71.77 in | Wt 216.6 lb

## 2017-08-09 DIAGNOSIS — J3489 Other specified disorders of nose and nasal sinuses: Secondary | ICD-10-CM

## 2017-08-09 DIAGNOSIS — J029 Acute pharyngitis, unspecified: Secondary | ICD-10-CM | POA: Diagnosis not present

## 2017-08-09 DIAGNOSIS — J069 Acute upper respiratory infection, unspecified: Secondary | ICD-10-CM

## 2017-08-09 DIAGNOSIS — R0981 Nasal congestion: Secondary | ICD-10-CM | POA: Diagnosis not present

## 2017-08-09 LAB — POCT RAPID STREP A (OFFICE): Rapid Strep A Screen: NEGATIVE

## 2017-08-09 MED ORDER — PSEUDOEPHEDRINE HCL 60 MG PO TABS: 60.0000 mg | ORAL_TABLET | Freq: Four times a day (QID) | ORAL | 0 refills | Status: DC | PRN

## 2017-08-09 MED ORDER — BENZOCAINE-MENTHOL 15-2.6 MG MT LOZG: 1.0000 | LOZENGE | OROMUCOSAL | 0 refills | Status: DC | PRN

## 2017-08-09 MED ORDER — IPRATROPIUM BROMIDE 0.03 % NA SOLN: 2.0000 | Freq: Two times a day (BID) | NASAL | 0 refills | Status: DC

## 2017-08-09 NOTE — Patient Instructions (Addendum)
-   We will treat this as a respiratory viral infection.  - I recommend you rest, drink plenty of fluids, eat light meals including soups.  - You may use some cepacol lozenges for your sore throat. - You may also take Tylenol or ibuprofen over-the-counter for your sore throat.  -For nasal congestion and runny nose, I recommend taking Sudafed and Atrovent nasal spray.  Start out with 60 mg of Sudafed.  If this works for you and you are not too jittery you may take 120 the next day. -If her cough worsens she may use Tessalon Perles as prescribed. - Please let me know if you are not seeing any improvement or get worse in 5-7 days.      IF you received an x-ray today, you will receive an invoice from [facility]. Please contact [facility] at [phone] with questions or concerns regarding your invoice.   IF you received labwork today, you will receive an invoice from [place]. Please contact [facility] at [phone] with questions or concerns regarding your invoice.   Our billing staff will not be able to assist you with questions regarding bills from these companies.  You will be contacted with the lab results as soon as they are available. The fastest way to get your results is to activate your My Chart account. Instructions are located on the last page of this paperwork. If you have not heard from [place] regarding the results in 2 weeks, please contact this office.

## 2017-08-09 NOTE — Progress Notes (Signed)
MRN: [id number] DOB: 01-09-1981  Subjective:   Bobby Hobbs is a 36 y.o. male presenting for chief complaint of Sore Throat (X 1 day) and Sinus Problem (X 1 day) .  Reports 1 day history of scratych throat, runny nose, and sinus congestion. Takes daily singulair and claritin with no full relief. Denies fever, sinus pain, itchy watery eyes, ear pain, ear drainage, wheezing, shortness of breath, myalgia and cough. Has history of seasonal allergies. Has been around family members who also have similar symptoms. Denies smoking. Denies any other aggravating or relieving factors, no other questions or concerns.  Bobby Hobbs has a current medication list which includes the following prescription(s): loratadine, montelukast, benzonatate, and diphenhydramine-phenylephrine. Also is allergic to dairy aid [lactase] and hycodan [hydrocodone-homatropine].  Bobby Hobbs  has a past medical history of Allergy. Also  has a past surgical history that includes laparoscopic appendectomy (N/A, 12/28/2014).   Objective:   Vitals: BP 112/72 (BP Location: Left Arm, Patient Position: Sitting, Cuff Size: Large)   Pulse 96   Temp 98.9 F (37.2 C) (Oral)   Resp 18   Ht 5' 11.77" (1.823 m)   Wt 216 lb 9.6 oz (98.2 kg)   SpO2 98%   BMI 29.56 kg/m   Physical Exam  Constitutional: He is oriented to person, place, and time. He appears well-developed and well-nourished. No distress.  HENT:  Head: Normocephalic and atraumatic.  Right Ear: Tympanic membrane, external ear and ear canal normal.  Left Ear: Tympanic membrane, external ear and ear canal normal.  Nose: Mucosal edema (mild bilaterally) and rhinorrhea present. Right sinus exhibits no maxillary sinus tenderness and no frontal sinus tenderness. Left sinus exhibits no maxillary sinus tenderness and no frontal sinus tenderness.  Mouth/Throat: Uvula is midline and mucous membranes are normal. Posterior oropharyngeal erythema present. Tonsils are 1+ on  the right. Tonsils are 1+ on the left. No tonsillar exudate.  Eyes: Conjunctivae are normal.  Neck: Normal range of motion.  Cardiovascular: Normal rate, regular rhythm and normal heart sounds.   Pulmonary/Chest: Effort normal and breath sounds normal. He has no wheezes. He has no rhonchi. He has no rales.  Lymphadenopathy:       Head (right side): No submental, no submandibular, no tonsillar, no preauricular, no posterior auricular and no occipital adenopathy present.       Head (left side): No submental, no submandibular, no tonsillar, no preauricular, no posterior auricular and no occipital adenopathy present.    He has no cervical adenopathy.       Right: No supraclavicular adenopathy present.       Left: No supraclavicular adenopathy present.  Neurological: He is alert and oriented to person, place, and time.  Skin: Skin is warm and dry.  Psychiatric: He has a normal mood and affect.  Vitals reviewed.    Results for orders placed or performed in visit on 08/09/17 (from the past 24 hour(s))  POCT rapid strep A     Status: None   Collection Time: 08/09/17 12:32 PM  Result Value Ref Range   Rapid Strep A Screen Negative Negative    Assessment and Plan :  1. Sore throat - POCT rapid strep A - Culture, Group A Strep - Benzocaine-Menthol (CEPACOL EXTRA STRENGTH) 15-2.6 MG LOZG; Use as directed 1 lozenge in the mouth or throat every 4 (four) hours as needed.  Dispense: 16 lozenge; Refill: 0 2. Rhinorrhea - ipratropium (ATROVENT) 0.03 % nasal spray; Place 2 sprays into both nostrils 2 (two) times daily.  Dispense: 30 mL; Refill: 0 3. Sinus congestion - pseudoephedrine (SUDAFED) 60 MG tablet; Take 1 tablet (60 mg total) by mouth every 6 (six) hours as needed for congestion.  Dispense: 30 tablet; Refill: 0 4. Acute upper respiratory infection Patient is well-appearing.  Vitals are stable.  He is afebrile.  Symptoms have only been present per day.  This is likely a common cold with viral  etiology.  Recommend symptomatic treatment at this time.Return to clinic if symptoms worsen, do not improve in 5-7, or as needed.  Tenna Delaine, PA-C  [facility] at [facility] 08/09/2017 2:10 PM

## 2017-08-11 LAB — CULTURE, GROUP A STREP: Strep A Culture: NEGATIVE

## 2017-08-24 ENCOUNTER — Encounter: Payer: Self-pay | Admitting: Physician Assistant

## 2017-08-24 ENCOUNTER — Other Ambulatory Visit: Payer: Self-pay

## 2017-08-24 ENCOUNTER — Ambulatory Visit: Payer: 59 | Admitting: Physician Assistant

## 2017-08-24 VITALS — BP 110/82 | HR 91 | Temp 98.3°F | Resp 18 | Ht 71.77 in | Wt 229.2 lb

## 2017-08-24 DIAGNOSIS — R05 Cough: Secondary | ICD-10-CM | POA: Diagnosis not present

## 2017-08-24 DIAGNOSIS — R059 Cough, unspecified: Secondary | ICD-10-CM

## 2017-08-24 DIAGNOSIS — R062 Wheezing: Secondary | ICD-10-CM | POA: Diagnosis not present

## 2017-08-24 MED ORDER — ALBUTEROL SULFATE HFA 108 (90 BASE) MCG/ACT IN AERS: 2.0000 | INHALATION_SPRAY | RESPIRATORY_TRACT | 0 refills | Status: DC | PRN

## 2017-08-24 MED ORDER — BENZONATATE 100 MG PO CAPS: 100.0000 mg | ORAL_CAPSULE | Freq: Three times a day (TID) | ORAL | 0 refills | Status: DC | PRN

## 2017-08-24 NOTE — Progress Notes (Signed)
MRN: [id number] DOB: Jan 11, 1981  Subjective:   Bobby Hobbs is a 36 y.o. male presenting for chief complaint of Cough (X 1 week - with fever) .  Reports 7 day history of dry cough (no hemoptysis).  Notes that it started after he helped clean out his grandmother's house and was exposed to black mold.  Had some Tessalon Perles for the first couple days, which helped.  He ran out of Bobby Hobbs returns today to get more.  Has had some associated wheezing.  Denies fever, sinus congestion, sinus pain, rhinorrhea, itchy watery eyes, sore throat, shortness of breath, chest tightness and myalgia, night sweats, chills, nausea, vomiting, abdominal pain and diarrhea. Has not had sick contact with anyone. Has history of seasonal allergies, no history of asthma. Denies smoking. Denies any other aggravating or relieving factors, no other questions or concerns.  Bobby Hobbs has a current medication list which includes the following prescription(s): benzocaine-menthol, ipratropium, loratadine, montelukast, pseudoephedrine, benzonatate, and diphenhydramine-phenylephrine. Also is allergic to dairy aid [lactase] and hycodan [hydrocodone-homatropine].  Bobby Hobbs  has a past medical history of Allergy. Also  has a past surgical history that includes laparoscopic appendectomy (N/A, 12/28/2014).   Objective:   Vitals: BP 110/82 (BP Location: Left Arm, Patient Position: Sitting, Cuff Size: Large)   Pulse 91   Temp 98.3 F (36.8 C) (Oral)   Resp 18   Ht 5' 11.77" (1.823 m)   Wt 229 lb 3.2 oz (104 kg)   SpO2 99%   BMI 31.28 kg/m   Physical Exam  Constitutional: He is oriented to person, place, and time. He appears well-developed and well-nourished. No distress.  HENT:  Head: Normocephalic and atraumatic.  Right Ear: Tympanic membrane, external ear and ear canal normal.  Left Ear: Tympanic membrane, external ear and ear canal normal.  Nose: Mucosal edema (in right nostril) present. Right  sinus exhibits no maxillary sinus tenderness and no frontal sinus tenderness. Left sinus exhibits no maxillary sinus tenderness and no frontal sinus tenderness.  Eyes: Conjunctivae are normal.  Neck: Normal range of motion.  Cardiovascular: Normal rate, regular rhythm and normal heart sounds.  Pulmonary/Chest: Effort normal and breath sounds normal. He has no wheezes. He has no rhonchi. He has no rales.  Lymphadenopathy:       Head (right side): No submental, no submandibular, no tonsillar, no preauricular, no posterior auricular and no occipital adenopathy present.       Head (left side): No submental, no submandibular, no tonsillar, no preauricular, no posterior auricular and no occipital adenopathy present.    He has no cervical adenopathy.       Right: No supraclavicular adenopathy present.       Left: No supraclavicular adenopathy present.  Neurological: He is alert and oriented to person, place, and time.  Skin: Skin is warm and dry.  Psychiatric: He has a normal mood and affect.  Vitals reviewed.   No results found for this or any previous visit (from the past 24 hour(s)).  Assessment and Plan :  1. Cough  Patient is well-appearing. Lungs are clear to auscultation.  Vitals stable.  Given Rx for Bobby Co.  Encouraged to use over-the-counter cough syrup as needed.  Contact our office if symptoms persist greater than 7 days.  Return sooner if symptoms worsen or he develops new concerning symptoms. - benzonatate (TESSALON) 100 MG capsule; Take 1-2 capsules (100-200 mg total) 3 (three) times daily as needed by mouth for cough.  Dispense: 40 capsule; Refill: 0  2. Wheezing Not auscultated today.  Given Rx for albuterol inhaler to use if wheezing returns. - albuterol (PROVENTIL HFA;VENTOLIN HFA) 108 (90 Base) MCG/ACT inhaler; Inhale 2 puffs every 4 (four) hours as needed into the lungs for wheezing or shortness of breath (cough, shortness of breath or wheezing.).  Dispense: 1  Inhaler; Refill: 0  Bobby Delaine, PA-C  [facility] at [facility] 08/24/2017 2:08 PM

## 2017-08-24 NOTE — Patient Instructions (Addendum)
I have given you refill for Tessalon Perles.  Please continue taking her allergy medicines as you are.  I have also given you a prescription for albuterol inhaler to use if you have that feeling of wheezing.  He may use it every 4-6 hours as needed.  Do not use if you are not having wheezing.  You may also use Delsym cough syrup at nighttime for cough.  Please call our office if you are not having any improvement in 1 week.  Return sooner if symptoms worsen or you develop productive cough, fever, chills, or shortness of breath.  Cough, Adult A cough helps to clear your throat and lungs. A cough may last only 2-3 weeks (acute), or it may last longer than 8 weeks (chronic). Many different things can cause a cough. A cough may be a sign of an illness or another medical condition. Follow these instructions at home:  Pay attention to any changes in your cough.  Take medicines only as told by your doctor. ? If you were prescribed an antibiotic medicine, take it as told by your doctor. Do not stop taking it even if you start to feel better. ? Talk with your doctor before you try using a cough medicine.  Drink enough fluid to keep your pee (urine) clear or pale yellow.  If the air is dry, use a cold steam vaporizer or humidifier in your home.  Stay away from things that make you cough at work or at home.  If your cough is worse at night, try using extra pillows to raise your head up higher while you sleep.  Do not smoke, and try not to be around smoke. If you need help quitting, ask your doctor.  Do not have caffeine.  Do not drink alcohol.  Rest as needed. Contact a doctor if:  You have new problems (symptoms).  You cough up yellow fluid (pus).  Your cough does not get better after 2-3 weeks, or your cough gets worse.  Medicine does not help your cough and you are not sleeping well.  You have pain that gets worse or pain that is not helped with medicine.  You have a fever.  You are  losing weight and you do not know why.  You have night sweats. Get help right away if:  You cough up blood.  You have trouble breathing.  Your heartbeat is very fast. This information is not intended to replace advice given to you by your health care provider. Make sure you discuss any questions you have with your health care provider. Document Released: 06/09/2011 Document Revised: 03/03/2016 Document Reviewed: 12/03/2014 Elsevier Interactive Patient Education  2018 [employer].     IF you received an x-ray today, you will receive an invoice from [facility]. Please contact [facility] at [phone] with questions or concerns regarding your invoice.   IF you received labwork today, you will receive an invoice from [place]. Please contact [facility] at [phone] with questions or concerns regarding your invoice.   Our billing staff will not be able to assist you with questions regarding bills from these companies.  You will be contacted with the lab results as soon as they are available. The fastest way to get your results is to activate your My Chart account. Instructions are located on the last page of this paperwork. If you have not heard from [place] regarding the results in 2 weeks, please contact this office.

## 2017-11-20 ENCOUNTER — Encounter: Payer: Self-pay | Admitting: Physician Assistant

## 2017-11-20 ENCOUNTER — Ambulatory Visit: Payer: 59 | Admitting: Physician Assistant

## 2017-11-20 ENCOUNTER — Other Ambulatory Visit: Payer: Self-pay

## 2017-11-20 VITALS — BP 118/80 | HR 92 | Temp 97.5°F | Resp 18 | Ht 72.05 in | Wt 218.0 lb

## 2017-11-20 DIAGNOSIS — J0100 Acute maxillary sinusitis, unspecified: Secondary | ICD-10-CM

## 2017-11-20 MED ORDER — PSEUDOEPHEDRINE HCL 60 MG PO TABS: 60.0000 mg | ORAL_TABLET | Freq: Three times a day (TID) | ORAL | 0 refills | Status: DC | PRN

## 2017-11-20 NOTE — Progress Notes (Signed)
MRN: [id number] DOB: 1981/02/18  Subjective:   Bobby Hobbs is a 37 y.o. male presenting for chief complaint of Facial Pain (X 1 1/2 weeks -pt states with headache and sore throat only when he wakes up) .  Reports 1.5 week history of sinus headache, sinus congestion, sinus pain and scratchy throat.  Symptoms are improving. Throat is more scratchy first thing in the morning. Notes it all started after being exposed to lots of dust. Only change in medication was discontinuing due to singulair due to headaches. Has tried mucinex and claritin with no full relief. He does not like to use daily nasal sprays.  Denies fever, cough, ear pain, wheezing, shortness of breath, chest tightness and chest pain, chills, fatigue, nausea, vomiting, abdominal pain and diarrhea. Has not had sick contact with anyone. Has extensive history of seasonal allergies.  Patient has not had flu shot this season. Denies smoking. Denies any other aggravating or relieving factors, no other questions or concerns.   Review of Systems  Constitutional: Negative for diaphoresis and weight loss.  Eyes: Negative for blurred vision and double vision.  Neurological: Negative for dizziness and weakness.  Psychiatric/Behavioral: Negative for memory loss.   [place] has a current medication list which includes the following prescription(s): albuterol, benzocaine-menthol, benzonatate, diphenhydramine-phenylephrine, ipratropium, loratadine, montelukast, and pseudoephedrine. Also is allergic to dairy aid [lactase] and hycodan [hydrocodone-homatropine].  [place]  has a past medical history of Allergy. Also  has a past surgical history that includes laparoscopic appendectomy (N/A, 12/28/2014).   Objective:   Vitals: BP 118/80 (BP Location: Left Arm, Patient Position: Sitting, Cuff Size: Large)   Pulse 92   Temp (!) 97.5 F (36.4 C) (Oral)   Resp 18   Ht 6' 0.05" (1.83 m)   Wt 218 lb (98.9 kg)   SpO2 99%   BMI  29.53 kg/m   Physical Exam  Constitutional: He is oriented to person, place, and time. He appears well-developed and well-nourished. No distress.  HENT:  Head: Normocephalic and atraumatic.  Right Ear: External ear and ear canal normal. Tympanic membrane is not erythematous and not bulging. A middle ear effusion is present.  Left Ear: External ear and ear canal normal. Tympanic membrane is not erythematous and not bulging. A middle ear effusion is present.  Nose: Mucosal edema (moderate on right and mild on left) present. Right sinus exhibits maxillary sinus tenderness ( mild ). Right sinus exhibits no frontal sinus tenderness. Left sinus exhibits no maxillary sinus tenderness and no frontal sinus tenderness.  Mouth/Throat: Uvula is midline, oropharynx is clear and moist and mucous membranes are normal. No tonsillar exudate.  Eyes: Conjunctivae are normal.  Neck: Normal range of motion.  Cardiovascular: Normal rate, regular rhythm and normal heart sounds.  Pulmonary/Chest: Effort normal and breath sounds normal. He has no wheezes. He has no rhonchi. He has no rales.  Lymphadenopathy:       Head (right side): No submental, no submandibular, no tonsillar, no preauricular, no posterior auricular and no occipital adenopathy present.       Head (left side): No submental, no submandibular, no tonsillar, no preauricular, no posterior auricular and no occipital adenopathy present.    He has no cervical adenopathy.       Right: No supraclavicular adenopathy present.       Left: No supraclavicular adenopathy present.  Neurological: He is alert and oriented to person, place, and time.  Skin: Skin is warm and dry.  Psychiatric: He has a normal  mood and affect.  Vitals reviewed.   No results found for this or any previous visit (from the past 24 hour(s)).  Assessment and Plan :  1. Acute maxillary sinusitis, recurrence not specified History and physical exam findings consistent with viral etiology.   Vital stable. Patient is well-appearing, no distress. Recommend maximizing conservative treatment at this time. Strongly recommended nasal saline rinses, over-the-counter ibuprofen as prescribed, and cool mist humidifier in room at nighttime.  Advised to return to clinic if symptoms worsen, do not improve in 3-5 days, or as needed.  - pseudoephedrine (SUDAFED) 60 MG tablet; Take 1 tablet (60 mg total) by mouth every 8 (eight) hours as needed for congestion.  Dispense: 30 tablet; Refill: 0   Tenna Delaine, PA-C  [facility] 11/20/2017 1:54 PM

## 2017-11-20 NOTE — Patient Instructions (Addendum)
- We will treat this as a respiratory viral infection.  - I recommend you rest, drink plenty of fluids, eat light meals including soups.  - You may use prescribed sudafed with claritin. I also recommend picking up over the counter nasal saline rinses and doing daily. Furthermore, I recommend over the counter ibuprofen 600mg  every 6-8 hours.  - You may also use Tylenol or ibuprofen over-the-counter for your sore throat.  - I also think you would strongly benefit from using a cool  Mist humidifier at night time in your room.  - Please let me know if you are not seeing any improvement or get worse in 3-5 days.      Sinusitis, Adult Sinusitis is soreness and inflammation of your sinuses. Sinuses are hollow spaces in the bones around your face. They are located:  Around your eyes.  In the middle of your forehead.  Behind your nose.  In your cheekbones.  Your sinuses and nasal passages are lined with a stringy fluid (mucus). Mucus normally drains out of your sinuses. When your nasal tissues get inflamed or swollen, the mucus can get trapped or blocked so air cannot flow through your sinuses. This lets bacteria, viruses, and funguses grow, and that leads to infection. Follow these instructions at home: Medicines  Take, use, or apply over-the-counter and prescription medicines only as told by your doctor. These may include nasal sprays.  If you were prescribed an antibiotic medicine, take it as told by your doctor. Do not stop taking the antibiotic even if you start to feel better. Hydrate and Humidify  Drink enough water to keep your pee (urine) clear or pale yellow.  Use a cool mist humidifier to keep the humidity level in your home above 50%.  Breathe in steam for 10-15 minutes, 3-4 times a day or as told by your doctor. You can do this in the bathroom while a hot shower is running.  Try not to spend time in cool or dry air. Rest  Rest as much as possible.  Sleep with your head  raised (elevated).  Make sure to get enough sleep each night. General instructions  Put a warm, moist washcloth on your face 3-4 times a day or as told by your doctor. This will help with discomfort.  Wash your hands often with soap and water. If there is no soap and water, use hand sanitizer.  Do not smoke. Avoid being around people who are smoking (secondhand smoke).  Keep all follow-up visits as told by your doctor. This is important. Contact a doctor if:  You have a fever.  Your symptoms get worse.  Your symptoms do not get better within 10 days. Get help right away if:  You have a very bad headache.  You cannot stop throwing up (vomiting).  You have pain or swelling around your face or eyes.  You have trouble seeing.  You feel confused.  Your neck is stiff.  You have trouble breathing. This information is not intended to replace advice given to you by your health care provider. Make sure you discuss any questions you have with your health care provider. Document Released: 03/14/2008 Document Revised: 05/22/2016 Document Reviewed: 07/22/2015 Elsevier Interactive Patient Education  2018 [employer].  IF you received an x-ray today, you will receive an invoice from [facility]. Please contact [facility] at [phone] with questions or concerns regarding your invoice.   IF you received labwork today, you will receive an invoice from [facility]. Please contact  [facility] at [phone] with questions or concerns regarding your invoice.   Our billing staff will not be able to assist you with questions regarding bills from these companies.  You will be contacted with the lab results as soon as they are available. The fastest way to get your results is to activate your My Chart account. Instructions are located on the last page of this paperwork. If you have not heard from [place] regarding the results in 2 weeks, please contact this office.

## 2017-12-14 ENCOUNTER — Encounter: Payer: Self-pay | Admitting: Physician Assistant

## 2017-12-14 ENCOUNTER — Other Ambulatory Visit: Payer: Self-pay

## 2017-12-14 ENCOUNTER — Ambulatory Visit: Payer: 59 | Admitting: Physician Assistant

## 2017-12-14 VITALS — BP 114/76 | HR 88 | Temp 97.6°F | Resp 18 | Ht 72.28 in | Wt 219.8 lb

## 2017-12-14 DIAGNOSIS — K529 Noninfective gastroenteritis and colitis, unspecified: Secondary | ICD-10-CM

## 2017-12-14 DIAGNOSIS — R197 Diarrhea, unspecified: Secondary | ICD-10-CM

## 2017-12-14 NOTE — Patient Instructions (Addendum)
Your symptoms are most consistent with gastroenteritis.  I recommend eating a bland diet for the next few days.  Advance your diet as tolerated.  Continue drinking lots of fluids.  If no improvement in the next 3-5 days,we have given you stool culture to collect.  Please bring back to our office.  If any of your symptoms worsen or you develop new concerning symptoms, please return office.  Food Choices to Help Relieve Diarrhea, Adult When you have diarrhea, the foods you eat and your eating habits are very important. Choosing the right foods and drinks can help:  Relieve diarrhea.  Replace lost fluids and nutrients.  Prevent dehydration.  What general guidelines should I follow? Relieving diarrhea  Choose foods with less than 2 g or .07 oz. of fiber per serving.  Limit fats to less than 8 tsp (38 g or 1.34 oz.) a day.  Avoid the following: ? Foods and beverages sweetened with high-fructose corn syrup, honey, or sugar alcohols such as xylitol, sorbitol, and mannitol. ? Foods that contain a lot of fat or sugar. ? Fried, greasy, or spicy foods. ? High-fiber grains, breads, and cereals. ? Raw fruits and vegetables.  Eat foods that are rich in probiotics. These foods include dairy products such as yogurt and fermented milk products. They help increase healthy bacteria in the stomach and intestines (gastrointestinal tract, or GI tract).  If you have lactose intolerance, avoid dairy products. These may make your diarrhea worse.  Take medicine to help stop diarrhea (antidiarrheal medicine) only as told by your health care provider. Replacing nutrients  Eat small meals or snacks every 3-4 hours.  Eat bland foods, such as white rice, toast, or baked potato, until your diarrhea starts to get better. Gradually reintroduce nutrient-rich foods as tolerated or as told by your health care provider. This includes: ? Well-cooked protein foods. ? Peeled, seeded, and soft-cooked fruits and  vegetables. ? Low-fat dairy products.  Take vitamin and mineral supplements as told by your health care provider. Preventing dehydration   Start by sipping water or a special solution to prevent dehydration (oral rehydration solution, ORS). Urine that is clear or pale yellow means that you are getting enough fluid.  Try to drink at least 8-10 cups of fluid each day to help replace lost fluids.  You may add other liquids in addition to water, such as clear juice or decaffeinated sports drinks, as tolerated or as told by your health care provider.  Avoid drinks with caffeine, such as coffee, tea, or soft drinks.  Avoid alcohol. What foods are recommended? The items listed may not be a complete list. Talk with your health care provider about what dietary choices are best for you. Grains White rice. White, [place], or pita breads (fresh or toasted), including plain rolls, buns, or bagels. White pasta. Saltine, soda, or graham crackers. Pretzels. Low-fiber cereal. Cooked cereals made with water (such as cornmeal, farina, or cream cereals). Plain muffins. Matzo. Melba toast. Zwieback. Vegetables Potatoes (without the skin). Most well-cooked and canned vegetables without skins or seeds. Tender lettuce. Fruits Apple sauce. Fruits canned in juice. Cooked apricots, cherries, grapefruit, peaches, pears, or plums. Fresh bananas and cantaloupe. Meats and other protein foods Baked or boiled chicken. Eggs. Tofu. Fish. Seafood. Smooth nut butters. Ground or well-cooked tender beef, ham, veal, lamb, pork, or poultry. Dairy Plain yogurt, kefir, and unsweetened liquid yogurt. Lactose-free milk, buttermilk, skim milk, or soy milk. Low-fat or nonfat hard cheese. Beverages Water. Low-calorie sports drinks. Fruit juices  without pulp. Strained tomato and vegetable juices. Decaffeinated teas. Sugar-free beverages not sweetened with sugar alcohols. Oral rehydration solutions, if approved by your health care  provider. Seasoning and other foods Bouillon, broth, or soups made from recommended foods. What foods are not recommended? The items listed may not be a complete list. Talk with your health care provider about what dietary choices are best for you. Grains Whole grain, whole wheat, bran, or rye breads, rolls, pastas, and crackers. Wild or brown rice. Whole grain or bran cereals. Barley. Oats and oatmeal. Corn tortillas or taco shells. Granola. Popcorn. Vegetables Raw vegetables. Fried vegetables. Cabbage, broccoli, Brussels sprouts, artichokes, baked beans, beet greens, corn, kale, legumes, peas, sweet potatoes, and yams. Potato skins. Cooked spinach and cabbage. Fruits Dried fruit, including raisins and dates. Raw fruits. Stewed or dried prunes. Canned fruits with syrup. Meat and other protein foods Fried or fatty meats. Deli meats. Chunky nut butters. Nuts and seeds. Beans and lentils. Berniece Salines. Hot dogs. Sausage. Dairy High-fat cheeses. Whole milk, chocolate milk, and beverages made with milk, such as milk shakes. Half-and-half. Cream. sour cream. Ice cream. Beverages Caffeinated beverages (such as coffee, tea, soda, or energy drinks). Alcoholic beverages. Fruit juices with pulp. Prune juice. Soft drinks sweetened with high-fructose corn syrup or sugar alcohols. High-calorie sports drinks. Fats and oils Butter. Cream sauces. Margarine. Salad oils. Plain salad dressings. Olives. Avocados. Mayonnaise. Sweets and desserts Sweet rolls, doughnuts, and sweet breads. Sugar-free desserts sweetened with sugar alcohols such as xylitol and sorbitol. Seasoning and other foods Honey. Hot sauce. Chili powder. Gravy. Cream-based or milk-based soups. Pancakes and waffles. Summary  When you have diarrhea, the foods you eat and your eating habits are very important.  Make sure you get at least 8-10 cups of fluid each day, or enough to keep your urine clear or pale yellow.  Eat bland foods and gradually  reintroduce healthy, nutrient-rich foods as tolerated, or as told by your health care provider.  Avoid high-fiber, fried, greasy, or spicy foods. This information is not intended to replace advice given to you by your health care provider. Make sure you discuss any questions you have with your health care provider. Document Released: 12/17/2003 Document Revised: 09/23/2016 Document Reviewed: 09/23/2016 Elsevier Interactive Patient Education  2018 [employer].     IF you received an x-ray today, you will receive an invoice from [facility]. Please contact [facility] at [phone] with questions or concerns regarding your invoice.   IF you received labwork today, you will receive an invoice from [facility]. Please contact [facility] at [phone] with questions or concerns regarding your invoice.   Our billing staff will not be able to assist you with questions regarding bills from these companies.  You will be contacted with the lab results as soon as they are available. The fastest way to get your results is to activate your My Chart account. Instructions are located on the last page of this paperwork. If you have not heard from [place] regarding the results in 2 weeks, please contact this office.

## 2017-12-14 NOTE — Progress Notes (Signed)
[place]  MRN: [id number] DOB: November 26, 1980  Subjective:  Bobby Hobbs is a 37 y.o. male who presents for evaluation of GI issues . Symptoms have been present for 3 days and stayed about the same. Has generalized abdominal discomfort and 2-3 episodes of diarrhea. Had a fever, which resolved. Had some mild nausea, resolved as well.  Patient denies vomiting, dry mouth, acholic stools, mucopurulent stool, blood in stool, constipation, hematemesis, hematuria and melena. Patient's oral intake has been normal for liquids. Patient's urine output has been adequate. Other contacts with similar symptoms include: wife's coworkers. Patient denies recent travel history. Patient has not had recent ingestion of possible contaminated food, toxic plants, or inappropriate medications/poisons.  Has tried pepto bismol with some relief. PSH of appendectomy in 2016.   Review of Systems  Genitourinary: Negative for decreased urine volume and difficulty urinating.  Neurological: Negative for dizziness and light-headedness.     Patient Active Problem List   Diagnosis Date Noted  . Eyelid dermatitis, allergic/contact 09/16/2015  . Rhinitis, allergic 09/16/2015  . Acute gangrenous appendicitis with perforation s/p lap appy 12/28/2014 12/28/2014  . Dental caries 06/03/2013    Current Outpatient Medications on File Prior to Visit  Medication Sig Dispense Refill  . albuterol (PROVENTIL HFA;VENTOLIN HFA) 108 (90 Base) MCG/ACT inhaler Inhale 2 puffs every 4 (four) hours as needed into the lungs for wheezing or shortness of breath (cough, shortness of breath or wheezing.). 1 Inhaler 0  . loratadine (CLARITIN) 10 MG tablet Take 10 mg by mouth daily.    . pseudoephedrine (SUDAFED) 60 MG tablet Take 1 tablet (60 mg total) by mouth every 8 (eight) hours as needed for congestion. (Patient not taking: Reported on 12/14/2017) 30 tablet 0   No current facility-administered medications on file prior to visit.      Allergies  Allergen Reactions  . Dairy Aid [Lactase]   . Hycodan [Hydrocodone-Homatropine] Nausea And Vomiting    Tolerates hydrocodone with acetaminophen     Objective:  BP 114/76 (BP Location: Left Arm, Patient Position: Sitting, Cuff Size: Large)   Pulse 88   Temp 97.6 F (36.4 C) (Oral)   Resp 18   Ht 6' 0.28" (1.836 m)   Wt 219 lb 12.8 oz (99.7 kg)   SpO2 98%   BMI 29.58 kg/m   Physical Exam  Constitutional: He is oriented to person, place, and time and well-developed, well-nourished, and in no distress.  HENT:  Head: Normocephalic and atraumatic.  Mouth/Throat: Uvula is midline, oropharynx is clear and moist and mucous membranes are normal.  Eyes: Conjunctivae are normal.  Neck: Normal range of motion.  Pulmonary/Chest: Effort normal.  Abdominal: Soft. Normal appearance and bowel sounds are normal. He exhibits no distension. There is no tenderness.  Neurological: He is alert and oriented to person, place, and time. Gait normal.  Skin: Skin is warm and dry.  Psychiatric: Affect normal.  Vitals reviewed.   Assessment and Plan :  1. Diarrhea, unspecified type - GI Profile, Stool, PCR 2. Gastroenteritis Hx and PE findings consistent with gastroenteritis. Patient is overall well-appearing, no distress.  Vitals stable.  He is tolerating oral fluids and food. Recommended BRAT diet. Discussed oral rehydration, reintroduction of solid foods, signs of dehydration. Return or go to emergency department if worsening symptoms, blood or bile, signs of dehydration, diarrhea lasting longer than 5 days or any new concerns. Given stool culture to collect if stools continue for the next 3-5 days.   Tenna Delaine PA-C  [facility] [place] 12/14/2017 10:15 AM

## 2018-01-30 ENCOUNTER — Ambulatory Visit: Payer: 59 | Admitting: Emergency Medicine

## 2018-01-30 ENCOUNTER — Encounter: Payer: Self-pay | Admitting: Emergency Medicine

## 2018-01-30 VITALS — BP 111/78 | HR 66 | Temp 97.5°F | Resp 17 | Ht 72.0 in | Wt 219.0 lb

## 2018-01-30 DIAGNOSIS — M62838 Other muscle spasm: Secondary | ICD-10-CM | POA: Diagnosis not present

## 2018-01-30 DIAGNOSIS — M25512 Pain in left shoulder: Secondary | ICD-10-CM | POA: Diagnosis not present

## 2018-01-30 DIAGNOSIS — M542 Cervicalgia: Secondary | ICD-10-CM | POA: Diagnosis not present

## 2018-01-30 MED ORDER — TRAMADOL HCL 50 MG PO TABS: 50.0000 mg | ORAL_TABLET | Freq: Three times a day (TID) | ORAL | 0 refills | Status: AC | PRN

## 2018-01-30 MED ORDER — CYCLOBENZAPRINE HCL 10 MG PO TABS: 10.0000 mg | ORAL_TABLET | Freq: Three times a day (TID) | ORAL | 0 refills | Status: DC | PRN

## 2018-01-30 MED ORDER — PREDNISONE 20 MG PO TABS: 40.0000 mg | ORAL_TABLET | Freq: Every day | ORAL | 0 refills | Status: DC

## 2018-01-30 NOTE — Patient Instructions (Addendum)
     IF you received an x-ray today, you will receive an invoice from [facility]. Please contact [facility] at [phone] with questions or concerns regarding your invoice.   IF you received labwork today, you will receive an invoice from [facility]. Please contact [facility] at [phone] with questions or concerns regarding your invoice.   Our billing staff will not be able to assist you with questions regarding bills from these companies.  You will be contacted with the lab results as soon as they are available. The fastest way to get your results is to activate your My Chart account. Instructions are located on the last page of this paperwork. If you have not heard from us regarding the results in 2 weeks, please contact this office.     Shoulder Pain Many things can cause shoulder pain, including:  An injury.  Moving the arm in the same way again and again (overuse).  Joint pain (arthritis).  Follow these instructions at home: Take these actions to help with your pain:  Squeeze a soft ball or a foam pad as much as you can. This helps to prevent swelling. It also makes the arm stronger.  Take over-the-counter and prescription medicines only as told by your doctor.  If told, put ice on the area: ? Put ice in a plastic bag. ? Place a towel between your skin and the bag. ? Leave the ice on for 20 minutes, 2-3 times per day. Stop putting on ice if it does not help with the pain.  If you were given a shoulder sling or immobilizer: ? Wear it as told. ? Remove it to shower or bathe. ? Move your arm as little as possible. ? Keep your hand moving. This helps prevent swelling.  Contact a doctor if:  Your pain gets worse.  Medicine does not help your pain.  You have new pain in your arm, hand, or fingers. Get help right away if:  Your arm, hand, or fingers: ? Tingle. ? Are numb. ? Are swollen. ? Are painful. ? Turn white or blue. This information is  not intended to replace advice given to you by your health care provider. Make sure you discuss any questions you have with your health care provider. Document Released: 03/14/2008 Document Revised: 05/22/2016 Document Reviewed: 01/19/2015 Elsevier Interactive Patient Education  2018 [employer]  

## 2018-01-30 NOTE — Progress Notes (Signed)
Bobby Hobbs 37 y.o.   Chief Complaint  Patient presents with  . Shoulder Pain    left    HISTORY OF PRESENT ILLNESS: This is a 37 y.o. male complaining of left shoulder pain that started 3 days ago.  Denies trauma.  Woke up Sunday morning with the pain.  Denies neurological symptoms to the left arm.  Pain starts in the neck area.  Sharp constant pain.  No other associated symptomatology.  Shoulder Pain   The pain is present in the left shoulder. This is a new problem. The current episode started in the past 7 days. There has been no history of extremity trauma. The problem occurs constantly. The problem has been gradually worsening. The quality of the pain is described as aching. The pain is at a severity of 6/10. The pain is moderate. Associated symptoms include a limited range of motion and stiffness. Pertinent negatives include no fever, itching, joint swelling, numbness or tingling. The symptoms are aggravated by activity. He has tried cold and NSAIDS for the symptoms. The treatment provided mild relief. There is no history of diabetes, gout, osteoarthritis or rheumatoid arthritis.     Prior to Admission medications   Medication Sig Start Date End Date Taking? Authorizing Provider  albuterol (PROVENTIL HFA;VENTOLIN HFA) 108 (90 Base) MCG/ACT inhaler Inhale 2 puffs every 4 (four) hours as needed into the lungs for wheezing or shortness of breath (cough, shortness of breath or wheezing.). 08/24/17  Yes Timmothy Euler, [place] D, PA-C  loratadine (CLARITIN) 10 MG tablet Take 10 mg by mouth daily.   Yes [provider]  pseudoephedrine (SUDAFED) 60 MG tablet Take 1 tablet (60 mg total) by mouth every 8 (eight) hours as needed for congestion. Patient not taking: Reported on 12/14/2017 11/20/17   Tenna Delaine D, PA-C    Allergies  Allergen Reactions  . Dairy Aid [Lactase]   . Hycodan [Hydrocodone-Homatropine] Nausea And Vomiting    Tolerates hydrocodone with acetaminophen      Patient Active Problem List   Diagnosis Date Noted  . Eyelid dermatitis, allergic/contact 09/16/2015  . Rhinitis, allergic 09/16/2015  . Acute gangrenous appendicitis with perforation s/p lap appy 12/28/2014 12/28/2014  . Dental caries 06/03/2013    Past Medical History:  Diagnosis Date  . Allergy     Past Surgical History:  Procedure Laterality Date  . LAPAROSCOPIC APPENDECTOMY N/A 12/28/2014   Procedure: APPENDECTOMY LAPAROSCOPIC;  Surgeon: Armandina Gemma, MD;  Location: [facility];  Service: General;  Laterality: N/A;    Social History   Socioeconomic History  . Marital status: Married    Spouse name: Janett Billow  . Number of children: 0  . Years of education: Not on file  . Highest education level: Not on file  Occupational History  . Not on file  Social Needs  . Financial resource strain: Not on file  . Food insecurity:    Worry: Not on file    Inability: Not on file  . Transportation needs:    Medical: Not on file    Non-medical: Not on file  Tobacco Use  . Smoking status: Never Smoker  . Smokeless tobacco: Never Used  Substance and Sexual Activity  . Alcohol use: Yes    Alcohol/week: 0.0 oz    Comment: once a year  . Drug use: No  . Sexual activity: Yes  Lifestyle  . Physical activity:    Days per week: Not on file    Minutes per session: Not on file  . Stress:  Not on file  Relationships  . Social connections:    Talks on phone: Not on file    Gets together: Not on file    Attends religious service: Not on file    Active member of club or organization: Not on file    Attends meetings of clubs or organizations: Not on file    Relationship status: Not on file  . Intimate partner violence:    Fear of current or ex partner: Not on file    Emotionally abused: Not on file    Physically abused: Not on file    Forced sexual activity: Not on file  Other Topics Concern  . Not on file  Social History Narrative  . Not on file    Family History  Problem  Relation Age of Onset  . Hypertension Mother   . Diabetes Father   . COPD Father   . Diabetes Maternal Grandmother      Review of Systems  Constitutional: Negative.  Negative for chills and fever.  HENT: Negative.  Negative for congestion, nosebleeds and sore throat.   Eyes: Negative.  Negative for blurred vision and double vision.  Respiratory: Negative.  Negative for cough and shortness of breath.   Cardiovascular: Negative.  Negative for chest pain.  Gastrointestinal: Negative.  Negative for abdominal pain, nausea and vomiting.  Genitourinary: Negative.   Musculoskeletal: Positive for joint pain (Left shoulder) and stiffness. Negative for gout.  Skin: Negative.  Negative for itching and rash.  Neurological: Negative.  Negative for dizziness, tingling, sensory change, focal weakness, weakness and numbness.  Endo/Heme/Allergies: Negative.   All other systems reviewed and are negative.  Vitals:   01/30/18 1222  BP: 111/78  Pulse: 66  Resp: 17  Temp: (!) 97.5 F (36.4 C)  SpO2: 98%     Physical Exam  Constitutional: He is oriented to person, place, and time. He appears well-developed and well-nourished.  HENT:  Head: Normocephalic and atraumatic.  Right Ear: External ear normal.  Left Ear: External ear normal.  Nose: Nose normal.  Mouth/Throat: Oropharynx is clear and moist.  Eyes: Pupils are equal, round, and reactive to light. Conjunctivae and EOM are normal.  Neck: Normal range of motion. Neck supple. No JVD present. No thyromegaly present.  Cardiovascular: Normal rate, regular rhythm and normal heart sounds.  Pulmonary/Chest: Effort normal and breath sounds normal.  Musculoskeletal:  Left shoulder: Diminished range of motion due to pain.  Positive tenderness to left trapezius and left paracervical muscles.  Rest of the left upper extremity within normal limits.  Lymphadenopathy:    He has no cervical adenopathy.  Neurological: He is alert and oriented to person,  place, and time. He displays normal reflexes. No sensory deficit. He exhibits normal muscle tone.  Skin: Skin is warm and dry. Capillary refill takes less than 2 seconds. No rash noted.  Psychiatric: He has a normal mood and affect. His behavior is normal.  Vitals reviewed.    ASSESSMENT & PLAN: Rodd was seen today for shoulder pain.  Diagnoses and all orders for this visit:  Acute pain of left shoulder -     predniSONE (DELTASONE) 20 MG tablet; Take 2 tablets (40 mg total) by mouth daily with breakfast for 5 days. -     traMADol (ULTRAM) 50 MG tablet; Take 1 tablet (50 mg total) by mouth every 8 (eight) hours as needed for up to 3 days.  Neck pain on left side  Muscle spasm -  cyclobenzaprine (FLEXERIL) 10 MG tablet; Take 1 tablet (10 mg total) by mouth 3 (three) times daily as needed for muscle spasms.    Patient Instructions       IF you received an x-ray today, you will receive an invoice from [facility] Radiology. Please contact [facility] Radiology at [phone] with questions or concerns regarding your invoice.   IF you received labwork today, you will receive an invoice from [place]. Please contact [facility] at [phone] with questions or concerns regarding your invoice.   Our billing staff will not be able to assist you with questions regarding bills from these companies.  You will be contacted with the lab results as soon as they are available. The fastest way to get your results is to activate your My Chart account. Instructions are located on the last page of this paperwork. If you have not heard from [place] regarding the results in 2 weeks, please contact this office.      Shoulder Pain Many things can cause shoulder pain, including:  An injury.  Moving the arm in the same way again and again (overuse).  Joint pain (arthritis).  Follow these instructions at home: Take these actions to help with your pain:  Squeeze a soft ball or a foam pad  as much as you can. This helps to prevent swelling. It also makes the arm stronger.  Take over-the-counter and prescription medicines only as told by your doctor.  If told, put ice on the area: ? Put ice in a plastic bag. ? Place a towel between your skin and the bag. ? Leave the ice on for 20 minutes, 2-3 times per day. Stop putting on ice if it does not help with the pain.  If you were given a shoulder sling or immobilizer: ? Wear it as told. ? Remove it to shower or bathe. ? Move your arm as little as possible. ? Keep your hand moving. This helps prevent swelling.  Contact a doctor if:  Your pain gets worse.  Medicine does not help your pain.  You have new pain in your arm, hand, or fingers. Get help right away if:  Your arm, hand, or fingers: ? Tingle. ? Are numb. ? Are swollen. ? Are painful. ? Turn white or blue. This information is not intended to replace advice given to you by your health care provider. Make sure you discuss any questions you have with your health care provider. Document Released: 03/14/2008 Document Revised: 05/22/2016 Document Reviewed: 01/19/2015 Elsevier Interactive Patient Education  2018 [employer].      Agustina Caroli, MD [facility]

## 2018-02-27 ENCOUNTER — Encounter: Payer: Self-pay | Admitting: Family Medicine

## 2018-02-27 ENCOUNTER — Other Ambulatory Visit: Payer: Self-pay

## 2018-02-27 ENCOUNTER — Ambulatory Visit: Payer: 59 | Admitting: Family Medicine

## 2018-02-27 VITALS — BP 100/70 | HR 85 | Temp 97.8°F | Ht 73.0 in | Wt 218.8 lb

## 2018-02-27 DIAGNOSIS — J029 Acute pharyngitis, unspecified: Secondary | ICD-10-CM

## 2018-02-27 DIAGNOSIS — J069 Acute upper respiratory infection, unspecified: Secondary | ICD-10-CM | POA: Diagnosis not present

## 2018-02-27 DIAGNOSIS — R05 Cough: Secondary | ICD-10-CM

## 2018-02-27 DIAGNOSIS — R059 Cough, unspecified: Secondary | ICD-10-CM

## 2018-02-27 LAB — POCT RAPID STREP A (OFFICE): Rapid Strep A Screen: NEGATIVE

## 2018-02-27 MED ORDER — BENZONATATE 100 MG PO CAPS: 100.0000 mg | ORAL_CAPSULE | Freq: Three times a day (TID) | ORAL | 0 refills | Status: DC | PRN

## 2018-02-27 NOTE — Patient Instructions (Addendum)
Saline nasal spray if needed for nasal congestion, tessalon or over the counter mucinex or mucinex DM for cough, cepacol or other lozenge for sore throat as needed and drink plenty of fluids and rest. Return to the clinic or go to the nearest emergency room if any of your symptoms worsen or new symptoms occur.   Upper Respiratory Infection, Adult Most upper respiratory infections (URIs) are caused by a virus. A URI affects the nose, throat, and upper air passages. The most common type of URI is often called "the common cold." Follow these instructions at home:  Take medicines only as told by your doctor.  Gargle warm saltwater or take cough drops to comfort your throat as told by your doctor.  Use a warm mist humidifier or inhale steam from a shower to increase air moisture. This may make it easier to breathe.  Drink enough fluid to keep your pee (urine) clear or pale yellow.  Eat soups and other clear broths.  Have a healthy diet.  Rest as needed.  Go back to work when your fever is gone or your doctor says it is okay. ? You may need to stay home longer to avoid giving your URI to others. ? You can also wear a face mask and wash your hands often to prevent spread of the virus.  Use your inhaler more if you have asthma.  Do not use any tobacco products, including cigarettes, chewing tobacco, or electronic cigarettes. If you need help quitting, ask your doctor. Contact a doctor if:  You are getting worse, not better.  Your symptoms are not helped by medicine.  You have chills.  You are getting more short of breath.  You have brown or red mucus.  You have yellow or brown discharge from your nose.  You have pain in your face, especially when you bend forward.  You have a fever.  You have puffy (swollen) neck glands.  You have pain while swallowing.  You have white areas in the back of your throat. Get help right away if:  You have very bad or  constant: ? Headache. ? Ear pain. ? Pain in your forehead, behind your eyes, and over your cheekbones (sinus pain). ? Chest pain.  You have long-lasting (chronic) lung disease and any of the following: ? Wheezing. ? Long-lasting cough. ? Coughing up blood. ? A change in your usual mucus.  You have a stiff neck.  You have changes in your: ? Vision. ? Hearing. ? Thinking. ? Mood. This information is not intended to replace advice given to you by your health care provider. Make sure you discuss any questions you have with your health care provider. Document Released: 03/14/2008 Document Revised: 05/29/2016 Document Reviewed: 01/01/2014 Elsevier Interactive Patient Education  2018 [employer].    IF you received an x-ray today, you will receive an invoice from [facility] Radiology. Please contact [facility] Radiology at [phone] with questions or concerns regarding your invoice.   IF you received labwork today, you will receive an invoice from [place]. Please contact [facility] at [phone] with questions or concerns regarding your invoice.   Our billing staff will not be able to assist you with questions regarding bills from these companies.  You will be contacted with the lab results as soon as they are available. The fastest way to get your results is to activate your My Chart account. Instructions are located on the last page of this paperwork. If you have not heard from [place] regarding the  results in 2 weeks, please contact this office.

## 2018-02-27 NOTE — Progress Notes (Signed)
Subjective:  By signing my name below, I, Bobby Hobbs, attest that this documentation has been prepared under the direction and in the presence of [name], MD Electronically Signed: Ladene Hobbs, ED Scribe 02/27/2018 at 12:07 PM.   Patient ID: Bobby Hobbs, male    DOB: 07/20/1981, 37 y.o.   MRN: [id number]  Chief Complaint  Patient presents with  . Sore Throat    chills, cough, and lathargic since Thursday (X5 days)   HPI Bobby Hobbs is a 37 y.o. male who presents to Primary Care at [facility] complaining of gradually worsening sore throat x 5 days. Pt reports associated symptoms of chills, fever at night but none during the day which has resolved, nasal congestion, gradually worsening cough, fatigue. He has tried Mucinex without relief and Tessalon Perles with temporary relief of cough but ran out. Denies ear pain, rash. His wife has been ill with similar symptoms and was diagnosed with a viral URI. No h/o asthma or pulmonary issues.  Patient Active Problem List   Diagnosis Date Noted  . Acute pain of left shoulder 01/30/2018  . Neck pain on left side 01/30/2018  . Muscle spasm 01/30/2018  . Eyelid dermatitis, allergic/contact 09/16/2015  . Rhinitis, allergic 09/16/2015  . Acute gangrenous appendicitis with perforation s/p lap appy 12/28/2014 12/28/2014  . Dental caries 06/03/2013   Past Medical History:  Diagnosis Date  . Allergy    Past Surgical History:  Procedure Laterality Date  . LAPAROSCOPIC APPENDECTOMY N/A 12/28/2014   Procedure: APPENDECTOMY LAPAROSCOPIC;  Surgeon: Armandina Gemma, MD;  Location: [facility];  Service: General;  Laterality: N/A;   Allergies  Allergen Reactions  . Dairy Aid [Lactase]   . Hycodan [Hydrocodone-Homatropine] Nausea And Vomiting    Tolerates hydrocodone with acetaminophen   Prior to Admission medications   Medication Sig Start Date End Date Taking? Authorizing Provider  albuterol (PROVENTIL HFA;VENTOLIN HFA) 108  (90 Base) MCG/ACT inhaler Inhale 2 puffs every 4 (four) hours as needed into the lungs for wheezing or shortness of breath (cough, shortness of breath or wheezing.). 08/24/17  Yes Timmothy Euler, [place] D, PA-C  loratadine (CLARITIN) 10 MG tablet Take 10 mg by mouth daily.   Yes [provider]   Social History   Socioeconomic History  . Marital status: Married    Spouse name: Janett Billow  . Number of children: 0  . Years of education: Not on file  . Highest education level: Not on file  Occupational History  . Not on file  Social Needs  . Financial resource strain: Not on file  . Food insecurity:    Worry: Not on file    Inability: Not on file  . Transportation needs:    Medical: Not on file    Non-medical: Not on file  Tobacco Use  . Smoking status: Never Smoker  . Smokeless tobacco: Never Used  Substance and Sexual Activity  . Alcohol use: Yes    Alcohol/week: 0.0 oz    Comment: once a year  . Drug use: No  . Sexual activity: Yes  Lifestyle  . Physical activity:    Days per week: Not on file    Minutes per session: Not on file  . Stress: Not on file  Relationships  . Social connections:    Talks on phone: Not on file    Gets together: Not on file    Attends religious service: Not on file    Active member of club or organization: Not on file  Attends meetings of clubs or organizations: Not on file    Relationship status: Not on file  . Intimate partner violence:    Fear of current or ex partner: Not on file    Emotionally abused: Not on file    Physically abused: Not on file    Forced sexual activity: Not on file  Other Topics Concern  . Not on file  Social History Narrative  . Not on file   Review of Systems  Constitutional: Positive for chills, fatigue and fever (resolved).  HENT: Positive for congestion and sore throat. Negative for ear pain.   Respiratory: Positive for cough.   Skin: Negative for rash.      Objective:   Physical Exam    Constitutional: He is oriented to person, place, and time. He appears well-developed and well-nourished.  HENT:  Head: Normocephalic and atraumatic.  Right Ear: Tympanic membrane, external ear and ear canal normal.  Left Ear: Tympanic membrane, external ear and ear canal normal.  Nose: No rhinorrhea.  Mouth/Throat: Oropharynx is clear and moist and mucous membranes are normal. No oropharyngeal exudate or posterior oropharyngeal erythema.  Eyes: Pupils are equal, round, and reactive to light. Conjunctivae are normal.  Neck: Neck supple.  Cardiovascular: Normal rate, regular rhythm, S1 normal, normal heart sounds and intact distal pulses.  No murmur heard. Pulmonary/Chest: Effort normal and breath sounds normal. He has no wheezes. He has no rhonchi. He has no rales.  Abdominal: Soft. There is no tenderness.  Lymphadenopathy:    He has no cervical adenopathy.  Neurological: He is alert and oriented to person, place, and time.  Skin: Skin is warm and dry. No rash noted.  Psychiatric: He has a normal mood and affect. His behavior is normal.  Vitals reviewed.  Vitals:   02/27/18 1141  BP: 100/70  Pulse: 85  Temp: 97.8 F (36.6 C)  TempSrc: Oral  SpO2: 98%  Weight: 218 lb 12.8 oz (99.2 kg)  Height: 6\' 1"  (1.854 m)   Results for orders placed or performed in visit on 02/27/18  POCT rapid strep A  Result Value Ref Range   Rapid Strep A Screen Negative Negative      Assessment & Plan:    Senath is a 37 y.o. male Sore throat - Plan: Culture, Group A Strep, POCT rapid strep A  Acute upper respiratory infection  Cough - Plan: benzonatate (TESSALON) 100 MG capsule  Suspected viral upper respiratory infection, reassuring exam, negative rapid strep testing.  Symptomatic care discussed with Tessalon Perles, cough drops, fluids, rest and RTC precautions given.  Handout provided, note for work also provided to return this Friday.  Meds ordered this encounter   Medications  . benzonatate (TESSALON) 100 MG capsule    Sig: Take 1 capsule (100 mg total) by mouth 3 (three) times daily as needed for cough.    Dispense:  20 capsule    Refill:  0   Patient Instructions   Saline nasal spray if needed for nasal congestion, tessalon or over the counter mucinex or mucinex DM for cough, cepacol or other lozenge for sore throat as needed and drink plenty of fluids and rest. Return to the clinic or go to the nearest emergency room if any of your symptoms worsen or new symptoms occur.   Upper Respiratory Infection, Adult Most upper respiratory infections (URIs) are caused by a virus. A URI affects the nose, throat, and upper air passages. The most common type of URI is often  called "the common cold." Follow these instructions at home:  Take medicines only as told by your doctor.  Gargle warm saltwater or take cough drops to comfort your throat as told by your doctor.  Use a warm mist humidifier or inhale steam from a shower to increase air moisture. This may make it easier to breathe.  Drink enough fluid to keep your pee (urine) clear or pale yellow.  Eat soups and other clear broths.  Have a healthy diet.  Rest as needed.  Go back to work when your fever is gone or your doctor says it is okay. ? You may need to stay home longer to avoid giving your URI to others. ? You can also wear a face mask and wash your hands often to prevent spread of the virus.  Use your inhaler more if you have asthma.  Do not use any tobacco products, including cigarettes, chewing tobacco, or electronic cigarettes. If you need help quitting, ask your doctor. Contact a doctor if:  You are getting worse, not better.  Your symptoms are not helped by medicine.  You have chills.  You are getting more short of breath.  You have brown or red mucus.  You have yellow or brown discharge from your nose.  You have pain in your face, especially when you bend  forward.  You have a fever.  You have puffy (swollen) neck glands.  You have pain while swallowing.  You have white areas in the back of your throat. Get help right away if:  You have very bad or constant: ? Headache. ? Ear pain. ? Pain in your forehead, behind your eyes, and over your cheekbones (sinus pain). ? Chest pain.  You have long-lasting (chronic) lung disease and any of the following: ? Wheezing. ? Long-lasting cough. ? Coughing up blood. ? A change in your usual mucus.  You have a stiff neck.  You have changes in your: ? Vision. ? Hearing. ? Thinking. ? Mood. This information is not intended to replace advice given to you by your health care provider. Make sure you discuss any questions you have with your health care provider. Document Released: 03/14/2008 Document Revised: 05/29/2016 Document Reviewed: 01/01/2014 Elsevier Interactive Patient Education  2018 [employer].    IF you received an x-ray today, you will receive an invoice from [facility] Radiology. Please contact [facility] Radiology at [phone] with questions or concerns regarding your invoice.   IF you received labwork today, you will receive an invoice from [place]. Please contact [facility] at [phone] with questions or concerns regarding your invoice.   Our billing staff will not be able to assist you with questions regarding bills from these companies.  You will be contacted with the lab results as soon as they are available. The fastest way to get your results is to activate your My Chart account. Instructions are located on the last page of this paperwork. If you have not heard from [place] regarding the results in 2 weeks, please contact this office.      I personally performed the services described in this documentation, which was scribed in my presence. The recorded information has been reviewed and considered for accuracy and completeness, addended by me as needed, and agree with  information above.  Signed,   Merri Ray, MD Primary [facility] at [facility].  02/27/18 1:43 PM

## 2018-03-01 LAB — CULTURE, GROUP A STREP: STREP A CULTURE: NEGATIVE

## 2018-05-15 DIAGNOSIS — L218 Other seborrheic dermatitis: Secondary | ICD-10-CM | POA: Diagnosis not present

## 2018-05-15 DIAGNOSIS — D485 Neoplasm of uncertain behavior of skin: Secondary | ICD-10-CM | POA: Diagnosis not present

## 2018-07-30 ENCOUNTER — Ambulatory Visit: Payer: 59 | Admitting: Family Medicine

## 2018-07-30 ENCOUNTER — Other Ambulatory Visit: Payer: Self-pay

## 2018-07-30 ENCOUNTER — Encounter: Payer: Self-pay | Admitting: Family Medicine

## 2018-07-30 VITALS — BP 110/72 | HR 72 | Temp 98.0°F | Ht 73.0 in | Wt 214.8 lb

## 2018-07-30 DIAGNOSIS — M20019 Mallet finger of unspecified finger(s): Secondary | ICD-10-CM

## 2018-07-30 DIAGNOSIS — M779 Enthesopathy, unspecified: Secondary | ICD-10-CM

## 2018-07-30 DIAGNOSIS — M778 Other enthesopathies, not elsewhere classified: Secondary | ICD-10-CM

## 2018-07-30 NOTE — Patient Instructions (Addendum)
Thumb symptoms may be early tendinitis.  Wear wrist splint for the next 1 to 2 weeks with range of motion at least twice per day as needed.  Ibuprofen over-the-counter if needed.  Finger issue appears to be a mallet finger.  See information below.  Keep splint in place until you have seen rehab specialist for custom splint.  That needs to remain in place for 6 weeks, followed by likely 6 weeks of night splinting (while asleep).  follow-up with me in 3 to 4 weeks.  Return to the clinic or go to the nearest emergency room if any of your symptoms worsen or new symptoms occur.  Alfonse Ras Disease Alfonse Ras disease is inflammation of the tendon on the thumb side of the wrist. Tendons are cords of tissue that connect bones to muscles. The tendons in your hand pass through a tunnel, or sheath. A slippery layer of tissue (synovium) lets the tendons move smoothly in the sheath. With de Quervain disease, the sheath swells or thickens, causing friction and pain. The condition is also called de Quervain tendinosis and de Quervain syndrome. It occurs most often in women who are 49-38 years old. What are the causes? The exact cause of de Quervain disease is not known. It may result from:  Overusing your hands, especially with repetitive motions that involve twisting your hand or using a forceful grip.  Pregnancy.  Rheumatoid disease.  What increases the risk? You may have a greater risk for de Quervain disease if you:  Are a middle-aged woman.  Are pregnant.  Have rheumatoid arthritis.  Have diabetes.  Use your hands far more than normal, especially with a tight grip or excessive twisting.  What are the signs or symptoms? Pain on the thumb side of your wrist is the main symptom of de Quervain disease. Other signs and symptoms include:  Pain that gets worse when you grasp something or turn your wrist.  Pain that extends up the forearm.  Cysts in the area of the pain.  Swelling of your  wrist and hand.  A sensation of snapping in the wrist.  Trouble moving the thumb and wrist.  How is this diagnosed? Your health care provider may diagnose de Quervain disease based on your signs and symptoms. A physical exam will also be done. A simple test Wynn Maudlin test) that involves pulling your thumb and wrist to see if this causes pain can help determine whether you have the condition. Sometimes you may need to have an X-ray. How is this treated? Avoiding any activity that causes pain and swelling is the best treatment. Other options include:  Wearing a splint.  Taking medicine. Anti-inflammatory medicines and corticosteroid injections may reduce inflammation and relieve pain.  Having surgery if other treatments do not work.  Follow these instructions at home:  Using ice can be helpful after doing activities that involve the sore wrist. To apply ice to the injured area: ? Put ice in a plastic bag. ? Place a towel between your skin and the bag. ? Leave the ice on for 20 minutes, 2-3 times a day.  Take medicines only as directed by your health care provider.  Wear your splint as directed. This will allow your hand to rest and heal. Contact a health care provider if:  Your pain medicine does not help.  Your pain gets worse.  You develop new symptoms. This information is not intended to replace advice given to you by your health care provider. Make sure  you discuss any questions you have with your health care provider. Document Released: 06/21/2001 Document Revised: 03/03/2016 Document Reviewed: 01/29/2014 Elsevier Interactive Patient Education  2018 [place] Finger Mallet finger is an injury that occurs from a blow to the tip of your straightened finger or thumb. It is also known as baseball finger. The blow to your fingertip causes it to bend farther than normal, which tears the cord that attaches to the tip of your finger (extensor tendon). Your extensor  tendon is what straightens the end of your finger. If this tendon is damaged, you will not be able to straighten your fingertip. Sometimes, a piece of bone may be pulled away with the tendon (avulsion injury), or the tendon may tear completely. In some cases, surgery may be required to repair the damage. What are the causes? Mallet finger is caused by a hard, direct hit to the tip of your finger or thumb. This injury often happens from getting hit in the finger with a hard ball, such as a baseball. What increases the risk? This injury is more likely to happen if you play sports that use a hard ball. What are the signs or symptoms? The main symptom of this injury is not being able to straighten the tip of your finger. You can manually straighten your fingertip with your other hand, but the finger cannot straighten on its own. Other symptoms may include:  Pain.  Swelling.  Bruising.  Blood under the fingernail.  How is this diagnosed? Your health care provider may suspect mallet finger if you are not able to extend your fingertip, especially if you recently injured your hand. Your health care provider will do a physical exam. This may include X-rays to see if a piece of bone has been pulled away or if the finger joint has separated (dislocated). How is this treated? Mallet finger may be treated with:  Wearing a splint on your fingertip to keep it straight (extended) while the tendon heals.  Surgery to repair the tendon, in severe cases. This may involve: ? The use of a pin or screw to keep your finger extended and your tendon attached. ? Taking a piece of tendon from another part of your body (graft) to replace a torn tendon.  Follow these instructions at home:  Take medicines only as directed by your health care provider.  Wear the splint as directed by your health care provider. Remove it only as directed by your health care provider.  If you take your splint off to dry it or change  it, gently press your finger on a flat surface to keep it straight.  If directed, apply ice to the injured area: ? Put ice in a plastic bag. ? Place a towel between your skin and the bag. ? Leave the ice on for 20 minutes, 2-3 times a day.  Raise the injured area above the level of your heart while you are sitting or lying down. Contact a health care provider if:  You have pain or swelling that is getting worse.  Your finger feels cold.  You cannot extend your finger after treatment. Get help right away if:  Even after loosening your splint, your finger is: ? Very red and swollen. ? White or blue. ? Numb or tingling. This information is not intended to replace advice given to you by your health care provider. Make sure you discuss any questions you have with your health care provider. Document Released: 09/23/2000 Document Revised: 03/03/2016  Document Reviewed: 07/30/2014 Elsevier Interactive Patient Education  [employer].   If you have lab work done today you will be contacted with your lab results within the next 2 weeks.  If you have not heard from [place] then please contact us. The fastest way to get your results is to [place] for My Chart.   IF you received an x-ray today, you will receive an invoice from [facility]. Please contact [facility] at [phone] with questions or concerns regarding your invoice.   IF you received labwork today, you will receive an invoice from [place]. Please contact [facility] at [phone] with questions or concerns regarding your invoice.   Our billing staff will not be able to assist you with questions regarding bills from these companies.  You will be contacted with the lab results as soon as they are available. The fastest way to get your results is to activate your My Chart account. Instructions are located on the last page of this paperwork. If you have not heard from [place] regarding the results in 2 weeks, please  contact this office.

## 2018-07-30 NOTE — Progress Notes (Signed)
Subjective:  By signing my name below, I, Bobby Hobbs, attest that this documentation has been prepared under the direction and in the presence of Bobby Agreste, MD Electronically Signed: [name], ED Scribe 07/30/2018 at 12:31 PM.   Patient ID: Bobby Hobbs, male    DOB: 20-Sep-1981, 37 y.o.   MRN: [id number]  Chief Complaint  Patient presents with  . right pinky and thumb pain    cant strighten pinky for the last week. sudden onset of pain for the thumb last night     HPI [name] is a 37 y.o. male who presents to Primary Care at [facility] complaining of R fifth finger and thumb pain since last night. Pt states that he noticed deformity to the R pinky ~2 wks ago but does not remember specific injury or pain with the exception of straightening his finger. Also notes some R thumb pain with flexing his finger since last night. He has tried advil. Pt is R hand dominant. He has been a detention office x 8 yrs. Denies new activities.  Patient Active Problem List   Diagnosis Date Noted  . Acute pain of left shoulder 01/30/2018  . Neck pain on left side 01/30/2018  . Muscle spasm 01/30/2018  . Eyelid dermatitis, allergic/contact 09/16/2015  . Rhinitis, allergic 09/16/2015  . Acute gangrenous appendicitis with perforation s/p lap appy 12/28/2014 12/28/2014  . Dental caries 06/03/2013   Past Medical History:  Diagnosis Date  . Allergy    Past Surgical History:  Procedure Laterality Date  . LAPAROSCOPIC APPENDECTOMY N/A 12/28/2014   Procedure: APPENDECTOMY LAPAROSCOPIC;  Surgeon: Armandina Gemma, MD;  Location: [facility];  Service: General;  Laterality: N/A;   Allergies  Allergen Reactions  . Dairy Aid [Lactase]   . Hycodan [Hydrocodone-Homatropine] Nausea And Vomiting    Tolerates hydrocodone with acetaminophen   Prior to Admission medications   Medication Sig Start Date End Date Taking? Authorizing Provider  albuterol (PROVENTIL HFA;VENTOLIN HFA) 108 (90  Base) MCG/ACT inhaler Inhale 2 puffs every 4 (four) hours as needed into the lungs for wheezing or shortness of breath (cough, shortness of breath or wheezing.). 08/24/17   Tenna Delaine D, PA-C  benzonatate (TESSALON) 100 MG capsule Take 1 capsule (100 mg total) by mouth 3 (three) times daily as needed for cough. 02/27/18   Bobby Agreste, MD  loratadine (CLARITIN) 10 MG tablet Take 10 mg by mouth daily.    [provider]   Social History   Socioeconomic History  . Marital status: Married    Spouse name: Janett Billow  . Number of children: 0  . Years of education: Not on file  . Highest education level: Not on file  Occupational History  . Not on file  Social Needs  . Financial resource strain: Not on file  . Food insecurity:    Worry: Not on file    Inability: Not on file  . Transportation needs:    Medical: Not on file    Non-medical: Not on file  Tobacco Use  . Smoking status: Never Smoker  . Smokeless tobacco: Never Used  Substance and Sexual Activity  . Alcohol use: Yes    Alcohol/week: 0.0 standard drinks    Comment: once a year  . Drug use: No  . Sexual activity: Yes  Lifestyle  . Physical activity:    Days per week: Not on file    Minutes per session: Not on file  . Stress: Not on file  Relationships  .  Social connections:    Talks on phone: Not on file    Gets together: Not on file    Attends religious service: Not on file    Active member of club or organization: Not on file    Attends meetings of clubs or organizations: Not on file    Relationship status: Not on file  . Intimate partner violence:    Fear of current or ex partner: Not on file    Emotionally abused: Not on file    Physically abused: Not on file    Forced sexual activity: Not on file  Other Topics Concern  . Not on file  Social History Narrative  . Not on file   Review of Systems  Musculoskeletal: Positive for arthralgias.      Objective:   Physical Exam  Constitutional:  He is oriented to person, place, and time. He appears well-developed and well-nourished. No distress.  HENT:  Head: Normocephalic and atraumatic.  Eyes: Conjunctivae and EOM are normal.  Neck: Neck supple. No tracheal deviation present.  Cardiovascular: Normal rate.  Pulmonary/Chest: Effort normal. No respiratory distress.  Musculoskeletal: Normal range of motion.  R pinky: DIP held in flexion ~45 degrees. Unable to extend distal phalanx against resistance. No bony tenderness. No apparent contracture. R thumb: No bony tenderness. Slight tenderness along extensor tendon. FROM. Full strength. Tight sensation with Wynn Maudlin but primarily into hand and thumb but not wrist.  Neurological: He is alert and oriented to person, place, and time.  Skin: Skin is warm and dry.  Psychiatric: He has a normal mood and affect. His behavior is normal.  Nursing note and vitals reviewed.  Vitals:   07/30/18 1138  BP: 110/72  Pulse: 72  Temp: 98 F (36.7 C)  TempSrc: Oral  SpO2: 97%  Weight: 214 lb 12.8 oz (97.4 kg)  Height: 6\' 1"  (1.854 m)      Assessment & Plan:   [name] is a 37 y.o. male Mallet deformity of little finger - Plan: Ambulatory referral to Physical Therapy  -No recollection of injury, but exam consistent with mallet finger.  I do not see a contracture.    - Placed in extension splint and referred to the hand physical therapy for custom splint as well as instructions on use.   -  Plan on 6 weeks of splinting followed by 6 weeks of night splinting.  Stressed importance of compliance for potential improvement.  Tendinitis of thumb - Plan: Thumb spica  -Early symptoms, extensor tendinitis, distal to typical areas of de Quervain's.  Placed in thumb spica splint, range of motion okay but decreased use for symptoms next 1 to 2 weeks, RTC precautions if persistent.   No orders of the defined types were placed in this encounter.  Patient Instructions     Thumb symptoms  may be early tendinitis.  Wear wrist splint for the next 1 to 2 weeks with range of motion at least twice per day as needed.  Ibuprofen over-the-counter if needed.  Finger issue appears to be a mallet finger.  See information below.  Keep splint in place until you have seen rehab specialist for custom splint.  That needs to remain in place for 6 weeks, followed by likely 6 weeks of night splinting (while asleep).  follow-up with me in 3 to 4 weeks.  Return to the clinic or go to the nearest emergency room if any of your symptoms worsen or new symptoms occur.  Alfonse Ras Disease Alfonse Ras  disease is inflammation of the tendon on the thumb side of the wrist. Tendons are cords of tissue that connect bones to muscles. The tendons in your hand pass through a tunnel, or sheath. A slippery layer of tissue (synovium) lets the tendons move smoothly in the sheath. With de Quervain disease, the sheath swells or thickens, causing friction and pain. The condition is also called de Quervain tendinosis and de Quervain syndrome. It occurs most often in women who are 72-74 years old. What are the causes? The exact cause of de Quervain disease is not known. It may result from:  Overusing your hands, especially with repetitive motions that involve twisting your hand or using a forceful grip.  Pregnancy.  Rheumatoid disease.  What increases the risk? You may have a greater risk for de Quervain disease if you:  Are a middle-aged woman.  Are pregnant.  Have rheumatoid arthritis.  Have diabetes.  Use your hands far more than normal, especially with a tight grip or excessive twisting.  What are the signs or symptoms? Pain on the thumb side of your wrist is the main symptom of de Quervain disease. Other signs and symptoms include:  Pain that gets worse when you grasp something or turn your wrist.  Pain that extends up the forearm.  Cysts in the area of the pain.  Swelling of your wrist and  hand.  A sensation of snapping in the wrist.  Trouble moving the thumb and wrist.  How is this diagnosed? Your health care provider may diagnose de Quervain disease based on your signs and symptoms. A physical exam will also be done. A simple test Wynn Maudlin test) that involves pulling your thumb and wrist to see if this causes pain can help determine whether you have the condition. Sometimes you may need to have an X-ray. How is this treated? Avoiding any activity that causes pain and swelling is the best treatment. Other options include:  Wearing a splint.  Taking medicine. Anti-inflammatory medicines and corticosteroid injections may reduce inflammation and relieve pain.  Having surgery if other treatments do not work.  Follow these instructions at home:  Using ice can be helpful after doing activities that involve the sore wrist. To apply ice to the injured area: ? Put ice in a plastic bag. ? Place a towel between your skin and the bag. ? Leave the ice on for 20 minutes, 2-3 times a day.  Take medicines only as directed by your health care provider.  Wear your splint as directed. This will allow your hand to rest and heal. Contact a health care provider if:  Your pain medicine does not help.  Your pain gets worse.  You develop new symptoms. This information is not intended to replace advice given to you by your health care provider. Make sure you discuss any questions you have with your health care provider. Document Released: 06/21/2001 Document Revised: 03/03/2016 Document Reviewed: 01/29/2014 Elsevier Interactive Patient Education  2018 [place] Finger Mallet finger is an injury that occurs from a blow to the tip of your straightened finger or thumb. It is also known as baseball finger. The blow to your fingertip causes it to bend farther than normal, which tears the cord that attaches to the tip of your finger (extensor tendon). Your extensor tendon is  what straightens the end of your finger. If this tendon is damaged, you will not be able to straighten your fingertip. Sometimes, a piece of bone may be pulled away  with the tendon (avulsion injury), or the tendon may tear completely. In some cases, surgery may be required to repair the damage. What are the causes? Mallet finger is caused by a hard, direct hit to the tip of your finger or thumb. This injury often happens from getting hit in the finger with a hard ball, such as a baseball. What increases the risk? This injury is more likely to happen if you play sports that use a hard ball. What are the signs or symptoms? The main symptom of this injury is not being able to straighten the tip of your finger. You can manually straighten your fingertip with your other hand, but the finger cannot straighten on its own. Other symptoms may include:  Pain.  Swelling.  Bruising.  Blood under the fingernail.  How is this diagnosed? Your health care provider may suspect mallet finger if you are not able to extend your fingertip, especially if you recently injured your hand. Your health care provider will do a physical exam. This may include X-rays to see if a piece of bone has been pulled away or if the finger joint has separated (dislocated). How is this treated? Mallet finger may be treated with:  Wearing a splint on your fingertip to keep it straight (extended) while the tendon heals.  Surgery to repair the tendon, in severe cases. This may involve: ? The use of a pin or screw to keep your finger extended and your tendon attached. ? Taking a piece of tendon from another part of your body (graft) to replace a torn tendon.  Follow these instructions at home:  Take medicines only as directed by your health care provider.  Wear the splint as directed by your health care provider. Remove it only as directed by your health care provider.  If you take your splint off to dry it or change it, gently  press your finger on a flat surface to keep it straight.  If directed, apply ice to the injured area: ? Put ice in a plastic bag. ? Place a towel between your skin and the bag. ? Leave the ice on for 20 minutes, 2-3 times a day.  Raise the injured area above the level of your heart while you are sitting or lying down. Contact a health care provider if:  You have pain or swelling that is getting worse.  Your finger feels cold.  You cannot extend your finger after treatment. Get help right away if:  Even after loosening your splint, your finger is: ? Very red and swollen. ? White or blue. ? Numb or tingling. This information is not intended to replace advice given to you by your health care provider. Make sure you discuss any questions you have with your health care provider. Document Released: 09/23/2000 Document Revised: 03/03/2016 Document Reviewed: 07/30/2014 Elsevier Interactive Patient Education  [employer].   If you have lab work done today you will be contacted with your lab results within the next 2 weeks.  If you have not heard from [place] then please contact us. The fastest way to get your results is to [place] for My Chart.   IF you received an x-ray today, you will receive an invoice from [facility]. Please contact [facility] at [phone] with questions or concerns regarding your invoice.   IF you received labwork today, you will receive an invoice from [facility]. Please contact [facility] at [phone] with questions or concerns regarding your invoice.   Our billing staff  will not be able to assist you with questions regarding bills from these companies.  You will be contacted with the lab results as soon as they are available. The fastest way to get your results is to activate your My Chart account. Instructions are located on the last page of this paperwork. If you have not heard from [place] regarding the results in 2 weeks, please contact this  office.       I personally performed the services described in this documentation, which was scribed in my presence. The recorded information has been reviewed and considered for accuracy and completeness, addended by me as needed, and agree with information above.  Signed,   Merri Ray, MD Primary Care at [place].  07/31/18 12:06 PM

## 2019-01-24 ENCOUNTER — Telehealth (INDEPENDENT_AMBULATORY_CARE_PROVIDER_SITE_OTHER): Payer: 59 | Admitting: Family Medicine

## 2019-01-24 ENCOUNTER — Other Ambulatory Visit: Payer: Self-pay

## 2019-01-24 DIAGNOSIS — J320 Chronic maxillary sinusitis: Secondary | ICD-10-CM | POA: Diagnosis not present

## 2019-01-24 MED ORDER — AMOXICILLIN-POT CLAVULANATE 875-125 MG PO TABS: 1.0000 | ORAL_TABLET | Freq: Two times a day (BID) | ORAL | 0 refills | Status: DC

## 2019-01-24 NOTE — Progress Notes (Signed)
Telemedicine Encounter- SOAP NOTE Established Patient  I discussed the limitations, risks, security and privacy concerns of performing an evaluation and management service by telephone and the availability of in person appointments. I also discussed with the patient that there may be a patient responsible charge related to this service. The patient expressed understanding and agreed to proceed.  This telephone encounter was conducted with the patient's  verbal consent via audio telecommunications: yes Patient was instructed to have this encounter in a suitably private space; and to only have persons present to whom they give permission to participate. In addition, patient identity was confirmed by use of name plus two identifiers (DOB and address).  I spent a total of 20 talking with the patient .   Cc. Sinus pressure and drainage "pollen" Bobby Hobbs is a 38 y.o. male established patient. Telephone visit today for sinus pressure, headache  HPI   Pt with sinus pressure and headache over the past few days Allergies to grass-used Singulair in the past- caused migraines Claritin currently daily Flonase in the past-does not like nasal sprays Sore throat especially in the morning and outside Eye irritation episodically -no drops No allergy shots in the past-surgery recommended for sinuses No fever Mucous-noted with nasal drainage-thick and green Cough episodically Diarrhea episodically No SOB Allergy trigger bronchitis in the past used inhaler-4 years ago No fatigue No skin changes Stayed home from work due to symptoms-needs note    Patient Active Problem List   Diagnosis Date Noted  . Acute pain of left shoulder 01/30/2018  . Neck pain on left side 01/30/2018  . Muscle spasm 01/30/2018  . Eyelid dermatitis, allergic/contact 09/16/2015  . Rhinitis, allergic 09/16/2015  . Acute gangrenous appendicitis with perforation s/p lap appy 12/28/2014  12/28/2014  . Dental caries 06/03/2013    Past Medical History:  Diagnosis Date  . Allergy     Current Outpatient Medications  Medication Sig Dispense Refill  . loratadine (CLARITIN) 10 MG tablet Take 10 mg by mouth daily.     No current facility-administered medications for this visit.     Allergies  Allergen Reactions  . Dairy Aid [Lactase]   . Hycodan [Hydrocodone-Homatropine] Nausea And Vomiting    Tolerates hydrocodone with acetaminophen    Social History   Socioeconomic History  . Marital status: Married    Spouse name: Janett Billow  . Number of children: 0  . Years of education: Not on file  . Highest education level: Not on file  Occupational History  . Not on file  Social Needs  . Financial resource strain: Not on file  . Food insecurity:    Worry: Not on file    Inability: Not on file  . Transportation needs:    Medical: Not on file    Non-medical: Not on file  Tobacco Use  . Smoking status: Never Smoker  . Smokeless tobacco: Never Used  Substance and Sexual Activity  . Alcohol use: Yes    Alcohol/week: 0.0 standard drinks    Comment: once a year  . Drug use: No  . Sexual activity: Yes  Lifestyle  . Physical activity:    Days per week: Not on file    Minutes per session: Not on file  . Stress: Not on file  Relationships  . Social connections:    Talks on phone: Not on file    Gets together: Not on file    Attends religious service: Not on file  Active member of club or organization: Not on file    Attends meetings of clubs or organizations: Not on file    Relationship status: Not on file  . Intimate partner violence:    Fear of current or ex partner: Not on file    Emotionally abused: Not on file    Physically abused: Not on file    Forced sexual activity: Not on file  Other Topics Concern  . Not on file  Social History Narrative  . Not on file    ROS CONSTITUTIONAL: no fever EENT: sinus problems-under eyes bilat, nasal  congestion-thick mucous,, sore throat-morning worse, facial tenderness-under eyes CV: no chest pain RESP: no SOB, episodic cough, no wheezing, green/yellowsputum production GI: diarrhea NEURO:frequent headaches with sinus pressure ALLERGY:seasonal allergies,    Objective   Vitals as reported by the patient:NONE  1. Chronic maxillary sinusitis-stop claritin, start mucinex, add sudafed during the day - amoxicillin-clavulanate (AUGMENTIN) 875-125 MG tablet; Take 1 tablet by mouth 2 (two) times daily.  Dispense: 20 tablet; Refill: 0 Work note-send in [place] I discussed the assessment and treatment plan with the patient. The patient was provided an opportunity to ask questions and all were answered. The patient agreed with the plan and demonstrated an understanding of the instructions.   The patient was advised to call back or seek an in-person evaluation if the symptoms worsen or if the condition fails to improve as anticipated.  I provided 20 minutes of non-face-to-face time during this encounter.  LISA Hannah Beat, MD  Primary Care at [facility] 01-24-19

## 2019-03-27 ENCOUNTER — Ambulatory Visit: Payer: 59 | Admitting: Family Medicine

## 2019-03-27 ENCOUNTER — Encounter: Payer: Self-pay | Admitting: Family Medicine

## 2019-03-27 ENCOUNTER — Other Ambulatory Visit: Payer: Self-pay

## 2019-03-27 VITALS — BP 112/70 | HR 70 | Temp 98.9°F | Resp 20 | Ht 72.05 in | Wt 224.4 lb

## 2019-03-27 DIAGNOSIS — M25512 Pain in left shoulder: Secondary | ICD-10-CM | POA: Diagnosis not present

## 2019-03-27 MED ORDER — MELOXICAM 15 MG PO TABS: 15.0000 mg | ORAL_TABLET | Freq: Every day | ORAL | 0 refills | Status: DC

## 2019-03-27 NOTE — Patient Instructions (Addendum)
     If you have lab work done today you will be contacted with your lab results within the next 2 weeks.  If you have not heard from [place] then please contact us. The fastest way to get your results is to [place] for My Chart.   IF you received an x-ray today, you will receive an invoice from [facility]. Please contact [facility] at [phone] with questions or concerns regarding your invoice.   IF you received labwork today, you will receive an invoice from [facility]. Please contact [facility] at [phone] with questions or concerns regarding your invoice.   Our billing staff will not be able to assist you with questions regarding bills from these companies.  You will be contacted with the lab results as soon as they are available. The fastest way to get your results is to activate your My Chart account. Instructions are located on the last page of this paperwork. If you have not heard from [place] regarding the results in 2 weeks, please contact this office.   Xray left shoulder mobic-rx

## 2019-03-27 NOTE — Progress Notes (Signed)
Acute Office Visit  Subjective:    Patient ID: Bobby Hobbs, male    DOB: 1981-07-04, 38 y.o.   MRN: [id number]  Chief Complaint  Patient presents with  . Shoulder Pain    X 3 day- left shoulder    HPI Patient is in today for left shoulder pain-"jabbling in shoulder with ice pick"  Woke pt from sleep. 8/10 on pain scale. Pt took advil with some help in symptoms. Pt with grinding in shoulder. Martial arts younger.  No sports currently.  NO upper body exercise. Push ups normally 3 days a week.  No weight lifting. No lifting at work or home.    Past Medical History:  Diagnosis Date  . Allergy     Past Surgical History:  Procedure Laterality Date  . LAPAROSCOPIC APPENDECTOMY N/A 12/28/2014   Procedure: APPENDECTOMY LAPAROSCOPIC;  Surgeon: Armandina Gemma, MD;  Location: [facility];  Service: General;  Laterality: N/A;    Family History  Problem Relation Age of Onset  . Hypertension Mother   . Diabetes Father   . COPD Father   . Diabetes Maternal Grandmother     Social History   Socioeconomic History  . Marital status: Married    Spouse name: Janett Billow  . Number of children: 0  . Years of education: Not on file  . Highest education level: Not on file  Occupational History  . Not on file  Social Needs  . Financial resource strain: Not on file  . Food insecurity    Worry: Not on file    Inability: Not on file  . Transportation needs    Medical: Not on file    Non-medical: Not on file  Tobacco Use  . Smoking status: Never Smoker  . Smokeless tobacco: Never Used  Substance and Sexual Activity  . Alcohol use: Yes    Alcohol/week: 0.0 standard drinks    Comment: once a year  . Drug use: No  . Sexual activity: Yes  Lifestyle  . Physical activity    Days per week: Not on file    Minutes per session: Not on file  . Stress: Not on file  Relationships  . Social Herbalist on phone: Not on file    Gets together: Not on file    Attends religious  service: Not on file    Active member of club or organization: Not on file    Attends meetings of clubs or organizations: Not on file    Relationship status: Not on file  . Intimate partner violence    Fear of current or ex partner: Not on file    Emotionally abused: Not on file    Physically abused: Not on file    Forced sexual activity: Not on file  Other Topics Concern  . Not on file  Social History Narrative  . Not on file    Outpatient Medications Prior to Visit  Medication Sig Dispense Refill  . loratadine (CLARITIN) 10 MG tablet Take 10 mg by mouth daily.    Marland Kitchen amoxicillin-clavulanate (AUGMENTIN) 875-125 MG tablet Take 1 tablet by mouth 2 (two) times daily. (Patient not taking: Reported on 03/27/2019) 20 tablet 0   No facility-administered medications prior to visit.     Allergies  Allergen Reactions  . Dairy Aid [Lactase]   . Hycodan [Hydrocodone-Homatropine] Nausea And Vomiting    Tolerates hydrocodone with acetaminophen    Review of Systems  Musculoskeletal: Positive for joint pain.   Left shoulder  pain-left -anterior tenderness with LROM-internal and external rotation    Objective:    Physical Exam  Constitutional: He appears well-developed and well-nourished.  HENT:  Head: Normocephalic and atraumatic.  Right Ear: External ear normal.  Left Ear: External ear normal.  Nose: Nose normal.  Mouth/Throat: Oropharynx is clear and moist.  Eyes: Conjunctivae are normal.  Neck: Normal range of motion. Neck supple.  Cardiovascular: Normal rate, regular rhythm and intact distal pulses.  Pulmonary/Chest: Effort normal and breath sounds normal.  Musculoskeletal:        General: Tenderness present.  Neurological: He is alert.  left shoulder LROM  BP 112/70 (BP Location: Right Arm, Patient Position: Sitting, Cuff Size: Large)   Pulse 70   Temp 98.9 F (37.2 C) (Oral)   Resp 20   Ht 6' 0.05" (1.83 m)   Wt 224 lb 6.4 oz (101.8 kg)   SpO2 97%   BMI 30.39 kg/m   Wt Readings from Last 3 Encounters:  03/27/19 224 lb 6.4 oz (101.8 kg)  07/30/18 214 lb 12.8 oz (97.4 kg)  02/27/18 218 lb 12.8 oz (99.2 kg)    Health Maintenance Due  Topic Date Due  . HIV Screening  10/02/1996    No results found for: TSH Lab Results  Component Value Date   WBC 11.0 (A) 12/16/2016   HGB 17.0 12/16/2016   HCT 49.4 12/16/2016   MCV 82.5 12/16/2016   PLT 202 12/30/2014   Lab Results  Component Value Date   NA 140 12/16/2016   K 3.9 12/16/2016   CO2 24 12/16/2016   GLUCOSE 86 12/16/2016   BUN 9 12/16/2016   CREATININE 1.07 12/16/2016   BILITOT 0.7 12/26/2014   ALKPHOS 53 12/26/2014   AST 65 (H) 12/26/2014   ALT 38 12/26/2014   PROT 6.8 12/26/2014   ALBUMIN 4.1 12/26/2014   CALCIUM 9.4 12/16/2016   ANIONGAP 8 12/31/2014       Assessment & Plan:   Problem List Items Addressed This Visit    None    1. Pain in joint of left shoulder - DG Shoulder Left; Future mobic-rx ROM exercises d/w pt LISA Hannah Beat, MD

## 2019-03-28 ENCOUNTER — Ambulatory Visit
Admission: RE | Admit: 2019-03-28 | Discharge: 2019-03-28 | Disposition: A | Discharge status: Home / Self Care | Payer: 59 | Source: Ambulatory Visit | Attending: Family Medicine | Admitting: Family Medicine

## 2019-03-28 DIAGNOSIS — M25512 Pain in left shoulder: Secondary | ICD-10-CM

## 2019-07-02 ENCOUNTER — Other Ambulatory Visit: Payer: Self-pay

## 2019-07-02 ENCOUNTER — Telehealth (INDEPENDENT_AMBULATORY_CARE_PROVIDER_SITE_OTHER): Payer: 59 | Admitting: Family Medicine

## 2019-07-02 VITALS — Wt 226.0 lb

## 2019-07-02 DIAGNOSIS — R51 Headache: Secondary | ICD-10-CM

## 2019-07-02 DIAGNOSIS — R197 Diarrhea, unspecified: Secondary | ICD-10-CM

## 2019-07-02 DIAGNOSIS — Z20822 Contact with and (suspected) exposure to covid-19: Secondary | ICD-10-CM

## 2019-07-02 DIAGNOSIS — R05 Cough: Secondary | ICD-10-CM

## 2019-07-02 DIAGNOSIS — Z20828 Contact with and (suspected) exposure to other viral communicable diseases: Secondary | ICD-10-CM

## 2019-07-02 DIAGNOSIS — R519 Headache, unspecified: Secondary | ICD-10-CM

## 2019-07-02 DIAGNOSIS — R059 Cough, unspecified: Secondary | ICD-10-CM

## 2019-07-02 NOTE — Patient Instructions (Signed)
Patient instructed to quarantine self.  He is to go by the [facility] testing center and get tested.  Order placed.  He is to take Tylenol if needed for his headache.  If he gets more shortness of breath issues he is to go to the emergency room.

## 2019-07-02 NOTE — Progress Notes (Signed)
CC: ? Exposure to covid-per pt does wear his mask.  Cough, sob, headache and some diarrhea, onset: Saturday9/19/2020.  No travel outside the [place] or [place] in the past 3 weeks.  No recent bp taken.  No refills needed.

## 2019-07-02 NOTE — Progress Notes (Signed)
Subjective:  This is a telemedicine visit.  The patient identified him self with appropriate identifiers of phone number and birthdate.  He understands the limits of a telemedicine visit. Patient with history of cold-like symptoms for 3 days.  He has a headache, some body aches, slight cough, and diarrhea.  He works at [employer] center and thinks he could well been exposed.  He has not worked since Thursday and is post work Midwife.  Objective: This is a telemedicine visit and cannot examine him but I could hear him cough over the phone  Assessment: Diarrhea Cough Headache Body aches Probable COVID exposure  Plan:  Instructions given patient to quarantine himself.  He is to go by the [facility] to get a test done.  He is not to work until he has a negative test and is doing better.  Time with patient 6 minutes.  Additional 5 minutes spent making the arrangements.

## 2019-07-03 LAB — NOVEL CORONAVIRUS, NAA: SARS-CoV-2, NAA: NOT DETECTED

## 2019-07-04 ENCOUNTER — Encounter: Payer: Self-pay | Admitting: Family Medicine

## 2019-07-04 ENCOUNTER — Telehealth (INDEPENDENT_AMBULATORY_CARE_PROVIDER_SITE_OTHER): Payer: 59 | Admitting: Family Medicine

## 2019-07-04 ENCOUNTER — Other Ambulatory Visit: Payer: Self-pay

## 2019-07-04 ENCOUNTER — Ambulatory Visit: Payer: 59 | Admitting: Family Medicine

## 2019-07-04 VITALS — BP 129/85 | HR 70 | Temp 98.4°F | Resp 18 | Ht 72.0 in | Wt 226.0 lb

## 2019-07-04 DIAGNOSIS — J00 Acute nasopharyngitis [common cold]: Secondary | ICD-10-CM | POA: Diagnosis not present

## 2019-07-04 DIAGNOSIS — R062 Wheezing: Secondary | ICD-10-CM

## 2019-07-04 DIAGNOSIS — R05 Cough: Secondary | ICD-10-CM

## 2019-07-04 DIAGNOSIS — J209 Acute bronchitis, unspecified: Secondary | ICD-10-CM

## 2019-07-04 DIAGNOSIS — R059 Cough, unspecified: Secondary | ICD-10-CM

## 2019-07-04 MED ORDER — AZITHROMYCIN 250 MG PO TABS: ORAL_TABLET | ORAL | 0 refills | Status: DC

## 2019-07-04 MED ORDER — BENZONATATE 100 MG PO CAPS: 100.0000 mg | ORAL_CAPSULE | Freq: Three times a day (TID) | ORAL | 0 refills | Status: DC | PRN

## 2019-07-04 MED ORDER — GUAIFENESIN-CODEINE 100-10 MG/5ML PO SOLN: ORAL | 0 refills | Status: DC

## 2019-07-04 MED ORDER — ALBUTEROL SULFATE HFA 108 (90 BASE) MCG/ACT IN AERS: INHALATION_SPRAY | RESPIRATORY_TRACT | 1 refills | Status: DC

## 2019-07-04 NOTE — Progress Notes (Signed)
Spoke to patient on the phone.  The cover test been negative twice and I have asked him to come on and today and let me check him.  No charge for this visit.

## 2019-07-04 NOTE — Patient Instructions (Addendum)
Drink plenty of fluids and get enough rest  Stay off work until Monday  Take the codeine/guaifenesin cough syrup 1 or 2 teaspoons every 4-6 hours as needed for cough  Use the inhaler albuterol 2 inhalations every 4-6 hours as needed for cough and wheeze  Take the azithromycin antibiotic 2 initially, then 1 daily for 4 days  You can also take an over-the-counter antihistamine decongestant such as Claritin-D or Allegra-D or Zyrtec-D for the head congestion and possible allergies  Stay off work through the weekend.  Return if worse.

## 2019-07-04 NOTE — Progress Notes (Signed)
Patient ID: [place], male    DOB: 04/07/1981  Age: 38 y.o. MRN: [id number]  Chief Complaint  Patient presents with  . Shortness of Breath  . Cough    started x4 days ago, OTC meds were taken with no relief, tested negative for covid twice    Subjective:   Patient has been ill since Saturday.  He has had a cough.  Over the weekend he had some diarrhea but that is resolved.  He got COVID tested twice and was negative both times.  He does not smoke.  He does have a history of seasonal allergic rhinitis.  His has been congested.  The cough is been pretty incessant.  He has not been febrile.  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  Current allergies, medications, problem list, past/family and social histories reviewed.  Objective:  BP 129/85 (BP Location: Right Arm, Patient Position: Sitting, Cuff Size: Large)   Pulse 70   Temp 98.4 F (36.9 C) (Oral)   Resp 18   Ht 6' (1.829 m)   Wt 226 lb (102.5 kg)   SpO2 96%   BMI 30.65 kg/m   Frequent coughing.  I talked to him twice on the phone and could hear him coughing both times.  His TMs are normal.  Nose congested.  Throat not erythematous.  Neck supple without nodes.  Chest was clear except for a mild end expiratory wheeze.  Heart regular without murmurs.  Assessment & Plan:   Assessment: 1. Cough   2. Acute bronchitis, unspecified organism   3. Acute rhinitis   4. Wheeze       Plan: See instructions.  If you get worse might need further evaluation with a chest x-ray.  No orders of the defined types were placed in this encounter.   Meds ordered this encounter  Medications  . azithromycin (ZITHROMAX) 250 MG tablet    Sig: Take 2 tabs PO x 1 dose, then 1 tab PO QD x 4 days    Dispense:  6 tablet    Refill:  0  . benzonatate (TESSALON) 100 MG capsule    Sig: Take 1-2 capsules (100-200 mg total) by mouth 3 (three) times daily as needed.    Dispense:  30 capsule    Refill:  0  . albuterol (VENTOLIN HFA)  108 (90 Base) MCG/ACT inhaler    Sig: Use 2 inhalations every 4-6 hours as needed for cough and wheezing.    Dispense:  8 g    Refill:  1  . guaiFENesin-codeine 100-10 MG/5ML syrup    Sig: Take 1 to 2 teaspoons every 6 hours as needed for cough.    Dispense:  120 mL    Refill:  0         Patient Instructions  Drink plenty of fluids and get enough rest  Stay off work until Monday  Take the codeine/guaifenesin cough syrup 1 or 2 teaspoons every 4-6 hours as needed for cough  Use the inhaler albuterol 2 inhalations every 4-6 hours as needed for cough and wheeze  Take the azithromycin antibiotic 2 initially, then 1 daily for 4 days  You can also take an over-the-counter antihistamine decongestant such as Claritin-D or Allegra-D or Zyrtec-D for the head congestion and possible allergies  Stay off work through the weekend.  Return if worse.    Return if symptoms worsen or fail to improve.   Ruben Reason, MD 07/04/2019

## 2019-08-24 ENCOUNTER — Other Ambulatory Visit: Payer: Self-pay | Admitting: Family Medicine

## 2019-08-24 DIAGNOSIS — J209 Acute bronchitis, unspecified: Secondary | ICD-10-CM

## 2019-08-24 NOTE — Telephone Encounter (Signed)
Forwarding medication refill request to the clinical pool for review. 

## 2019-09-06 ENCOUNTER — Encounter: Payer: Self-pay | Admitting: Adult Health Nurse Practitioner

## 2019-09-06 ENCOUNTER — Telehealth (INDEPENDENT_AMBULATORY_CARE_PROVIDER_SITE_OTHER): Payer: 59 | Admitting: Adult Health Nurse Practitioner

## 2019-09-06 ENCOUNTER — Other Ambulatory Visit: Payer: Self-pay

## 2019-09-06 DIAGNOSIS — J209 Acute bronchitis, unspecified: Secondary | ICD-10-CM

## 2019-09-06 DIAGNOSIS — R1111 Vomiting without nausea: Secondary | ICD-10-CM

## 2019-09-06 DIAGNOSIS — R197 Diarrhea, unspecified: Secondary | ICD-10-CM

## 2019-09-06 DIAGNOSIS — R1110 Vomiting, unspecified: Secondary | ICD-10-CM | POA: Insufficient documentation

## 2019-09-06 HISTORY — DX: Diarrhea, unspecified: R19.7

## 2019-09-06 HISTORY — DX: Vomiting, unspecified: R11.10

## 2019-09-06 NOTE — Progress Notes (Signed)
Pt experiencing vomiting and diarrhea, says it is possible due to something he's eaten yesterday. Symptoms are starting to subside, just the loose stool for now. Job is requesting a return back to work note. No known exposure to the virus. Medication list and pharmacy verified. Asking for refill on albuterol

## 2019-09-06 NOTE — Progress Notes (Signed)
Telemedicine Encounter- SOAP NOTE Established Patient  This telephone encounter was conducted with the patient's (or proxy's) verbal consent via audio telecommunications: yes/no: Yes Patient was instructed to have this encounter in a suitably private space; and to only have persons present to whom they give permission to participate. In addition, patient identity was confirmed by use of name plus two identifiers (DOB and address).  I discussed the limitations, risks, security and privacy concerns of performing an evaluation and management service by telephone and the availability of in person appointments. I also discussed with the patient that there may be a patient responsible charge related to this service. The patient expressed understanding and agreed to proceed.  I spent a total of TIME; 0 MIN TO 60 MIN: 15 minutes talking with the patient or their proxy.  No chief complaint on file.   Subjective   Bobby Hobbs is a 38 y.o. established patient. Telephone visit today for 24 hours of diarrhea and 1 episode of vomiting on Thanksgiving Day.  Patient thinks it was something he ate. Denies fever, chills, night sweats.  No nausea.  No further vomiting.  Needs a note for work missed yesterday.     Patient Active Problem List   Diagnosis Date Noted  . Vomiting 09/06/2019  . Diarrhea 09/06/2019  . Pain in joint of left shoulder 03/27/2019  . Chronic maxillary sinusitis 01/24/2019  . Acute pain of left shoulder 01/30/2018  . Neck pain on left side 01/30/2018  . Muscle spasm 01/30/2018  . Eyelid dermatitis, allergic/contact 09/16/2015  . Rhinitis, allergic 09/16/2015  . Acute gangrenous appendicitis with perforation s/p lap appy 12/28/2014 12/28/2014  . Dental caries 06/03/2013    Past Medical History:  Diagnosis Date  . Allergy   . Diarrhea 09/06/2019  . Vomiting 09/06/2019    Current Outpatient Medications  Medication Sig Dispense Refill  . albuterol (VENTOLIN HFA)  108 (90 Base) MCG/ACT inhaler Use 2 inhalations every 4-6 hours as needed for cough and wheezing. 8 g 1   No current facility-administered medications for this visit.     Allergies  Allergen Reactions  . Dairy Aid [Lactase]   . Hycodan [Hydrocodone-Homatropine] Nausea And Vomiting    Tolerates hydrocodone with acetaminophen    Social History   Socioeconomic History  . Marital status: Married    Spouse name: Janett Billow  . Number of children: 0  . Years of education: Not on file  . Highest education level: Not on file  Occupational History  . Not on file  Social Needs  . Financial resource strain: Not on file  . Food insecurity    Worry: Not on file    Inability: Not on file  . Transportation needs    Medical: Not on file    Non-medical: Not on file  Tobacco Use  . Smoking status: Never Smoker  . Smokeless tobacco: Never Used  Substance and Sexual Activity  . Alcohol use: Yes    Alcohol/week: 0.0 standard drinks    Comment: once a year  . Drug use: No  . Sexual activity: Yes  Lifestyle  . Physical activity    Days per week: Not on file    Minutes per session: Not on file  . Stress: Not on file  Relationships  . Social Herbalist on phone: Not on file    Gets together: Not on file    Attends religious service: Not on file    Active member of club or  organization: Not on file    Attends meetings of clubs or organizations: Not on file    Relationship status: Not on file  . Intimate partner violence    Fear of current or ex partner: Not on file    Emotionally abused: Not on file    Physically abused: Not on file    Forced sexual activity: Not on file  Other Topics Concern  . Not on file  Social History Narrative  . Not on file    ROS   Review of Systems See HPI Constitution: No fevers or chills No malaise No diaphoresis Skin: No rash or itching Eyes: no blurry vision, no double vision GU: no dysuria or hematuria Neuro: no dizziness or  headaches   Objective    'GEN: , NAD, Non-toxic, Alert & Oriented x 3  PSYCH: Normally interactive. Conversant. Not depressed or anxious appearing.  Calm demeanor.  Diagnoses and all orders for this visit:  Acute bronchitis, unspecified organism  Non-intractable vomiting without nausea, unspecified vomiting type  Diarrhea, unspecified type   Recommended OTC Immodium for relief.  Will monitor for 24 hours.  He returns to work Monday.  Should he develop fever, chills, or other worsening symptoms, he will call or return.   I discussed the assessment and treatment plan with the patient. The patient was provided an opportunity to ask questions and all were answered. The patient agreed with the plan and demonstrated an understanding of the instructions.   The patient was advised to call back or seek an in-person evaluation if the symptoms worsen or if the condition fails to improve as anticipated.  I provided 15 minutes of non-face-to-face time during this encounter.  Glyn Ade, NP  Primary Care at [facility]

## 2019-10-31 ENCOUNTER — Other Ambulatory Visit: Payer: Self-pay

## 2019-10-31 ENCOUNTER — Telehealth (INDEPENDENT_AMBULATORY_CARE_PROVIDER_SITE_OTHER): Payer: 59 | Admitting: Family Medicine

## 2019-10-31 VITALS — Temp 98.4°F

## 2019-10-31 DIAGNOSIS — J019 Acute sinusitis, unspecified: Secondary | ICD-10-CM | POA: Diagnosis not present

## 2019-10-31 MED ORDER — AMOXICILLIN-POT CLAVULANATE 875-125 MG PO TABS: 1.0000 | ORAL_TABLET | Freq: Two times a day (BID) | ORAL | 0 refills | Status: DC

## 2019-10-31 MED ORDER — FLUTICASONE PROPIONATE 50 MCG/ACT NA SUSP: 2.0000 | Freq: Every day | NASAL | 6 refills | Status: DC

## 2019-10-31 NOTE — Progress Notes (Signed)
Patient ID: [place], male    DOB: 01/27/1981  Age: 39 y.o. MRN: [id number]  Chief Complaint  Patient presents with  . Sinus Problem    pt states that he has had sinus issues for a week. he has hx of sinus infections. Pt was tested for COVID and test was negaive.    Subjective:   Telemedicine visit  Patient identified himself, phone number, address, birthdate.  He is in position to talk to me at this time.  He understands that billing will take place to his insurance carrier and he may be responsible for some co-pays.  He understands that there are limitations of the telemedicine visits.  He has been sick for about 11 days with sinus congestion that is gradually getting worse rather than better Thursday a week ago he had got Covid testing which was negative.  He has not had any fever.  No loss of taste or smell.  No major other systemic symptoms.  He has had the headache.  Facial pain behind and below his eyes.  He is blowing purulent mucus out of his nose.  Some cough when he coughs up some mucus.  That is not a major issue for him.  No shortness of breath.  As mentioned no fever chills.  He works at the [employer], has been worked except for [employer] he would like cough.  Last night he developed a migraine which has persisted still.  Current allergies, medications, problem list, past/family and social histories reviewed.  Objective:  Temp 98.4 F (36.9 C) (Oral)   Not able to examine him.  Assessment & Plan:   Assessment: 1. Acute rhinosinusitis       Plan: Instructions given the patient.  He understands them.  He does have my chart.  Prescription sent to his pharmacy.  Flonase and Augmentin.  Work excuse sent through [employer].  No orders of the defined types were placed in this encounter.   Meds ordered this encounter  Medications  . amoxicillin-clavulanate (AUGMENTIN) 875-125 MG tablet    Sig: Take 1 tablet by mouth 2 (two) times daily.    Dispense:  20 tablet   Refill:  0  . fluticasone (FLONASE) 50 MCG/ACT nasal spray    Sig: Place 2 sprays into both nostrils daily.    Dispense:  16 g    Refill:  6         Patient Instructions  Drink plenty of fluids and get enough rest  Stay off work tonight  Take Augmentin (amoxicillin/clavulanate) 1 twice daily for infection  Use the fluticasone nose spray (Flonase) 2 sprays each nostril twice daily for about 4 days, then decrease to once daily  Return to get rechecked if worse.    No follow-ups on file.   Ruben Reason, MD 10/31/2019

## 2019-10-31 NOTE — Progress Notes (Signed)
CC: pt states he has had sinus problems for about a week. He was tested for COVID and the results were negative.

## 2019-10-31 NOTE — Patient Instructions (Signed)
Drink plenty of fluids and get enough rest  Stay off work tonight  Take Augmentin (amoxicillin/clavulanate) 1 twice daily for infection  Use the fluticasone nose spray (Flonase) 2 sprays each nostril twice daily for about 4 days, then decrease to once daily  Return to get rechecked if worse.

## 2019-12-17 ENCOUNTER — Other Ambulatory Visit: Payer: Self-pay

## 2019-12-17 ENCOUNTER — Encounter: Payer: Self-pay | Admitting: Registered Nurse

## 2019-12-17 ENCOUNTER — Telehealth (INDEPENDENT_AMBULATORY_CARE_PROVIDER_SITE_OTHER): Payer: 59 | Admitting: Registered Nurse

## 2019-12-17 VITALS — Wt 221.0 lb

## 2019-12-17 DIAGNOSIS — R059 Cough, unspecified: Secondary | ICD-10-CM

## 2019-12-17 DIAGNOSIS — R0989 Other specified symptoms and signs involving the circulatory and respiratory systems: Secondary | ICD-10-CM | POA: Diagnosis not present

## 2019-12-17 DIAGNOSIS — R05 Cough: Secondary | ICD-10-CM

## 2019-12-17 DIAGNOSIS — J4540 Moderate persistent asthma, uncomplicated: Secondary | ICD-10-CM

## 2019-12-17 MED ORDER — GUAIFENESIN-DM 100-10 MG/5ML PO SYRP: 5.0000 mL | ORAL_SOLUTION | ORAL | 1 refills | Status: DC | PRN

## 2019-12-17 MED ORDER — BUDESONIDE-FORMOTEROL FUMARATE 80-4.5 MCG/ACT IN AERO: 2.0000 | INHALATION_SPRAY | Freq: Two times a day (BID) | RESPIRATORY_TRACT | 3 refills | Status: DC

## 2019-12-17 NOTE — Progress Notes (Signed)
Telemedicine Encounter- SOAP NOTE Established Patient  This telephone encounter was conducted with the patient's (or proxy's) verbal consent via audio telecommunications: yes  Patient was instructed to have this encounter in a suitably private space; and to only have persons present to whom they give permission to participate. In addition, patient identity was confirmed by use of name plus two identifiers (DOB and address).  I discussed the limitations, risks, security and privacy concerns of performing an evaluation and management service by telephone and the availability of in person appointments. I also discussed with the patient that there may be a patient responsible charge related to this service. The patient expressed understanding and agreed to proceed.  I spent a total of 25 minutes talking with the patient or their proxy.  Chief Complaint  Patient presents with  . Sore Throat    patient states since over the weekend he has been experiencing some shortness of breath when breathing in and out , a sore throat. Per patient he has only been using the inhaler but not helping much. patient went to get a covid test today     Bobby Hobbs is a 39 y.o. established patient. Telephone visit today for COVID symptoms  HPI Onset last Thursday/Friday with some sinus pressure, migraine like headache, and upper respiratory disturbance. On Saturday, shob set in, one incidence of diarrhea. Worsening since with some cough, wheezing, and fatigue. No fevers, change in taste or smell, vomiting, nausea, chest pain, pleuritic pain, visual changes. 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   Has not previously had COVID - he is tested every 2 weeks.  Earlier today went for two tests - rapid and regular COVID tests - fortunately while we were  talking, his rapid result returned negative. We discussed that this is not a guarantee and he should wait for the next test to return negative and for significant clinical improvement before returning to work.   Patient Active Problem List   Diagnosis Date Noted  . Vomiting 09/06/2019  . Diarrhea 09/06/2019  . Pain in joint of left shoulder 03/27/2019  . Chronic maxillary sinusitis 01/24/2019  . Acute pain of left shoulder 01/30/2018  . Neck pain on left side 01/30/2018  . Muscle spasm 01/30/2018  . Eyelid dermatitis, allergic/contact 09/16/2015  . Rhinitis, allergic 09/16/2015  . Acute gangrenous appendicitis with perforation s/p lap appy 12/28/2014 12/28/2014  . Dental caries 06/03/2013    Past Medical History:  Diagnosis Date  . Allergy   . Diarrhea 09/06/2019  . Vomiting 09/06/2019    Current Outpatient Medications  Medication Sig Dispense Refill  . albuterol (VENTOLIN HFA) 108 (90 Base) MCG/ACT inhaler Use 2 inhalations every 4-6 hours as needed for cough and wheezing. 8 g 1  . amoxicillin-clavulanate (AUGMENTIN) 875-125 MG tablet Take 1 tablet by mouth 2 (two) times daily. (Patient not taking: Reported on 12/17/2019) 20 tablet 0  . budesonide-formoterol (SYMBICORT) 80-4.5 MCG/ACT inhaler Inhale 2 puffs into the lungs 2 (two) times daily. 1 Inhaler 3  . fluticasone (FLONASE) 50 MCG/ACT nasal spray Place 2 sprays into both nostrils daily. (Patient not taking: Reported on 12/17/2019) 16 g 6  . guaiFENesin-dextromethorphan (ROBITUSSIN DM) 100-10 MG/5ML syrup Take 5 mLs by mouth every 4 (four) hours as needed for cough. 118 mL 1   No  current facility-administered medications for this visit.    Allergies  Allergen Reactions  . Montelukast Other (See Comments)    headache  . Dairy Aid [Lactase]   . Hycodan [Hydrocodone-Homatropine] Nausea And Vomiting    Tolerates hydrocodone with acetaminophen    Social History   Socioeconomic History  . Marital status: Married    Spouse  name: Bobby Hobbs  . Number of children: 0  . Years of education: Not on file  . Highest education level: Not on file  Occupational History  . Not on file  Tobacco Use  . Smoking status: Never Smoker  . Smokeless tobacco: Never Used  Substance and Sexual Activity  . Alcohol use: Yes    Alcohol/week: 0.0 standard drinks    Comment: once a year  . Drug use: No  . Sexual activity: Yes  Other Topics Concern  . Not on file  Social History Narrative  . Not on file   Social Determinants of Health   Financial Resource Strain:   . Difficulty of Paying Living Expenses: Not on file  Food Insecurity:   . Worried About Charity fundraiser in the Last Year: Not on file  . Ran Out of Food in the Last Year: Not on file  Transportation Needs:   . Lack of Transportation (Medical): Not on file  . Lack of Transportation (Non-Medical): Not on file  Physical Activity:   . Days of Exercise per Week: Not on file  . Minutes of Exercise per Session: Not on file  Stress:   . Feeling of Stress : Not on file  Social Connections:   . Frequency of Communication with Friends and Family: Not on file  . Frequency of Social Gatherings with Friends and Family: Not on file  . Attends Religious Services: Not on file  . Active Member of Clubs or Organizations: Not on file  . Attends Archivist Meetings: Not on file  . Marital Status: Not on file  Intimate Partner Violence:   . Fear of Current or Ex-Partner: Not on file  . Emotionally Abused: Not on file  . Physically Abused: Not on file  . Sexually Abused: Not on file    ROS Per hpi  Objective   Vitals as reported by the patient: Today's Vitals   12/17/19 1426  Weight: 221 lb (100.2 kg)    Bobby Hobbs was seen today for sore throat.  Diagnoses and all orders for this visit:  Moderate persistent asthma without complication -     budesonide-formoterol (SYMBICORT) 80-4.5 MCG/ACT inhaler; Inhale 2 puffs into the lungs 2 (two) times  daily.  Chest congestion -     guaiFENesin-dextromethorphan (ROBITUSSIN DM) 100-10 MG/5ML syrup; Take 5 mLs by mouth every 4 (four) hours as needed for cough.  Cough -     guaiFENesin-dextromethorphan (ROBITUSSIN DM) 100-10 MG/5ML syrup; Take 5 mLs by mouth every 4 (four) hours as needed for cough.   PLAN  Discussed supportive care  Will provide work note  symbicort inhaler - two puffs twice daily for wheezing. Continue to use albuterol inhaler as needed for any additional symptoms  Dextromethorphan-guaifenesin syrup for congestion and cough relief  ED and return to clinic precautions given  Patient encouraged to call clinic with any questions, comments, or concerns.  I discussed the assessment and treatment plan with the patient. The patient was provided an opportunity to ask questions and all were answered. The patient agreed with the plan and demonstrated an understanding of the instructions.  The patient was advised to call back or seek an in-person evaluation if the symptoms worsen or if the condition fails to improve as anticipated.  I provided 25 minutes of non-face-to-face time during this encounter.  Maximiano Coss, NP  Primary Care at [facility]

## 2019-12-17 NOTE — Patient Instructions (Signed)
° ° ° °  If you have lab work done today you will be contacted with your lab results within the next 2 weeks.  If you have not heard from us then please contact us. The fastest way to get your results is to [place] for My Chart. ° ° °IF you received an x-ray today, you will receive an invoice from [facility]. Please contact [facility] at [phone] with questions or concerns regarding your invoice.  ° °IF you received labwork today, you will receive an invoice from [facility]. Please contact [facility] at [phone] with questions or concerns regarding your invoice.  ° °Our billing staff will not be able to assist you with questions regarding bills from these companies. ° °You will be contacted with the lab results as soon as they are available. The fastest way to get your results is to activate your My Chart account. Instructions are located on the last page of this paperwork. If you have not heard from us regarding the results in 2 weeks, please contact this office. °  ° ° ° °

## 2019-12-18 ENCOUNTER — Other Ambulatory Visit: Payer: Self-pay

## 2019-12-18 ENCOUNTER — Encounter: Payer: Self-pay | Admitting: Registered Nurse

## 2019-12-18 NOTE — Telephone Encounter (Signed)
Spoke with patient and got his doctors note sent to his [employer]

## 2019-12-19 ENCOUNTER — Other Ambulatory Visit: Payer: Self-pay

## 2019-12-19 ENCOUNTER — Ambulatory Visit: Payer: 59 | Admitting: Medical

## 2019-12-19 VITALS — BP 114/73 | HR 75 | Temp 96.5°F | Resp 18 | Ht 73.0 in | Wt 230.0 lb

## 2019-12-19 DIAGNOSIS — R062 Wheezing: Secondary | ICD-10-CM

## 2019-12-19 DIAGNOSIS — J4540 Moderate persistent asthma, uncomplicated: Secondary | ICD-10-CM | POA: Diagnosis not present

## 2019-12-19 NOTE — Patient Instructions (Signed)
Pt has remote hx of possible asthma when child but never on inhaler. Now recently mild wheeze with severe transient dyspnea on exertion somewhat like exercise induced asthma. Do recommend start symbicort and use albuterol if needed. With rare breathing issues in past will refer to pulmonologist for evaluation/possible PFT.  Follow up 4-6 weeks to allow time for specialist to evaluate  or as needed during interim.

## 2019-12-19 NOTE — Progress Notes (Signed)
Subjective:    Patient ID: [place], male    DOB: 1981-06-15, 39 y.o.   MRN: [id number]  HPI   Pt in for first time.   Pt works in [employer] in [place]. Pt does not work out regularly but walks 10k-15k steps a day. Pt state eats moderate healthy, non smoker and very rare alcohol use 2-3 times a year. Married- no children.   Pt has history of asthma when a child. He states just recently started symbicort. Pt had video visit on Friday. He was prescribed but had not started yet. Pt recently wheezing 3-4 times a day. Pt was using albuterol 1-2 times a day before visit past Friday. Pt states day of some training that was very short of breath. This was Tuesday of last week.  Pt states on Tuesday he had covid test. Rapid test was negative. Send out test is pending. 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 No report of any fever, no chills, no body aches, loss of smell/taste or severe fatigue.     Review of Systems  Constitutional: Negative for chills, fatigue and fever.  Respiratory: Negative for chest tightness, shortness of breath and wheezing.        Rare at this point. Now just one transient cough.  Cardiovascular: Negative for chest pain and palpitations.  Gastrointestinal: Negative for abdominal pain, anal bleeding, constipation, nausea and vomiting.  Musculoskeletal: Negative for back pain and myalgias.  Skin: Negative for rash.  Neurological: Negative for dizziness, speech difficulty, weakness and numbness.  Hematological: Negative for adenopathy. Does not bruise/bleed easily.  Psychiatric/Behavioral: Negative for agitation and confusion.    Past Medical History:  Diagnosis Date  . Allergy   . Diarrhea 09/06/2019  . Vomiting 09/06/2019     Social History   Socioeconomic History  . Marital status: Married    Spouse name: Janett Billow  . Number of children: 0  . Years of education: Not on file  . Highest  education level: Not on file  Occupational History  . Not on file  Tobacco Use  . Smoking status: Never Smoker  . Smokeless tobacco: Never Used  Substance and Sexual Activity  . Alcohol use: Yes    Alcohol/week: 0.0 standard drinks    Comment: once a year  . Drug use: No  . Sexual activity: Yes  Other Topics Concern  . Not on file  Social History Narrative  . Not on file   Social Determinants of Health   Financial Resource Strain:   . Difficulty of Paying Living Expenses:   Food Insecurity:   . Worried About Charity fundraiser in the Last Year:   . Arboriculturist in the Last Year:   Transportation Needs:   . Film/video editor (Medical):   Marland Kitchen Lack of Transportation (Non-Medical):   Physical Activity:   . Days of Exercise per Week:   . Minutes of Exercise per Session:   Stress:   . Feeling of Stress :   Social Connections:   . Frequency of Communication with Friends and Family:   . Frequency of Social Gatherings with Friends and Family:   . Attends Religious Services:   . Active Member of Clubs or Organizations:   . Attends Archivist Meetings:   Marland Kitchen Marital Status:   Intimate Partner Violence:   . Fear of Current or Ex-Partner:   . Emotionally Abused:   .  Physically Abused:   . Sexually Abused:     Past Surgical History:  Procedure Laterality Date  . LAPAROSCOPIC APPENDECTOMY N/A 12/28/2014   Procedure: APPENDECTOMY LAPAROSCOPIC;  Surgeon: Armandina Gemma, MD;  Location: [facility];  Service: General;  Laterality: N/A;    Family History  Problem Relation Age of Onset  . Hypertension Mother   . Diabetes Father   . COPD Father   . Diabetes Maternal Grandmother     Allergies  Allergen Reactions  . Montelukast Other (See Comments)    headache  . Dairy Aid [Lactase]   . Hycodan [Hydrocodone-Homatropine] Nausea And Vomiting    Tolerates hydrocodone with acetaminophen    Current Outpatient Medications on File Prior to Visit  Medication Sig Dispense  Refill  . albuterol (VENTOLIN HFA) 108 (90 Base) MCG/ACT inhaler Use 2 inhalations every 4-6 hours as needed for cough and wheezing. 8 g 1  . amoxicillin-clavulanate (AUGMENTIN) 875-125 MG tablet Take 1 tablet by mouth 2 (two) times daily. (Patient not taking: Reported on 12/17/2019) 20 tablet 0  . budesonide-formoterol (SYMBICORT) 80-4.5 MCG/ACT inhaler Inhale 2 puffs into the lungs 2 (two) times daily. 1 Inhaler 3  . fluticasone (FLONASE) 50 MCG/ACT nasal spray Place 2 sprays into both nostrils daily. (Patient not taking: Reported on 12/17/2019) 16 g 6  . guaiFENesin-dextromethorphan (ROBITUSSIN DM) 100-10 MG/5ML syrup Take 5 mLs by mouth every 4 (four) hours as needed for cough. 118 mL 1   No current facility-administered medications on file prior to visit.    BP 114/73   Pulse 75   Temp (!) 96.5 F (35.8 C) (Temporal)   Resp 18   Ht 6\' 1"  (1.854 m)   Wt 230 lb (104.3 kg)   SpO2 99%   BMI 30.34 kg/m       Objective:   Physical Exam  General Mental Status- Alert. General Appearance- Not in acute distress.   Skin General: Color- Normal Color. Moisture- Normal Moisture.  Neck Carotid Arteries- Normal color. Moisture- Normal Moisture. No carotid bruits. No JVD.  Chest and Lung Exam Auscultation: Breath Sounds:-Normal, clear even and unlabored.  Cardiovascular Auscultation:Rythm- Regular. Murmurs & Other Heart Sounds:Auscultation of the heart reveals- No Murmurs.  Abdomen Inspection:-Inspeection Normal. Palpation/Percussion:Note:No mass. Palpation and Percussion of the abdomen reveal- Non Tender, Non Distended + BS, no rebound or guarding.    Neurologic Cranial Nerve exam:- CN III-XII intact(No nystagmus), symmetric smile. Strength:- 5/5 equal and symmetric strength both upper and lower extremities.       Assessment & Plan:  Pt has remote hx of possible asthma when child but never on inhaler. Now recently mild wheeze with severe transient dyspnea on exertion  somewhat like exercise induced asthma. Do recommend start symbicort and use albuterol if needed. With rare breathing issues in past will refer to pulmonologist for evaluation/possible PFT.  Follow up 4-6 weeks to allow time for specialist to evaluate  or as needed during interim.  30 minutes spent with pt today.     Mackie Pai, PA-C

## 2019-12-20 ENCOUNTER — Other Ambulatory Visit: Payer: Self-pay

## 2019-12-20 ENCOUNTER — Telehealth (INDEPENDENT_AMBULATORY_CARE_PROVIDER_SITE_OTHER): Payer: 59 | Admitting: Registered Nurse

## 2019-12-20 DIAGNOSIS — R0989 Other specified symptoms and signs involving the circulatory and respiratory systems: Secondary | ICD-10-CM

## 2019-12-20 MED ORDER — AZITHROMYCIN 250 MG PO TABS: ORAL_TABLET | ORAL | 0 refills | Status: DC

## 2019-12-20 NOTE — Patient Instructions (Signed)
° ° ° °  If you have lab work done today you will be contacted with your lab results within the next 2 weeks.  If you have not heard from us then please contact us. The fastest way to get your results is to [facility] for My Chart. ° ° °IF you received an x-ray today, you will receive an invoice from [facility]. Please contact [facility] at [phone] with questions or concerns regarding your invoice.  ° °IF you received labwork today, you will receive an invoice from [facility]. Please contact [facility] at [phone] with questions or concerns regarding your invoice.  ° °Our billing staff will not be able to assist you with questions regarding bills from these companies. ° °You will be contacted with the lab results as soon as they are available. The fastest way to get your results is to activate your My Chart account. Instructions are located on the last page of this paperwork. If you have not heard from us regarding the results in 2 weeks, please contact this office. °  ° ° ° °

## 2019-12-24 ENCOUNTER — Encounter: Payer: Self-pay | Admitting: Registered Nurse

## 2019-12-26 ENCOUNTER — Encounter: Payer: Self-pay | Admitting: Family Medicine

## 2019-12-26 ENCOUNTER — Other Ambulatory Visit: Payer: Self-pay

## 2019-12-26 ENCOUNTER — Ambulatory Visit (INDEPENDENT_AMBULATORY_CARE_PROVIDER_SITE_OTHER): Payer: 59 | Admitting: Family Medicine

## 2019-12-26 DIAGNOSIS — R05 Cough: Secondary | ICD-10-CM

## 2019-12-26 DIAGNOSIS — R059 Cough, unspecified: Secondary | ICD-10-CM

## 2019-12-26 MED ORDER — PREDNISONE 20 MG PO TABS: 40.0000 mg | ORAL_TABLET | Freq: Every day | ORAL | 0 refills | Status: DC

## 2019-12-26 NOTE — Progress Notes (Signed)
[facility] [address] [place], [place] [address] [phone]  Date:  12/26/2019   Name:  Bobby Hobbs   DOB:  [birth year]   MRN:  [id number]  PCP:  Mackie Pai, PA-C    Chief Complaint: No chief complaint on file.   History of Present Illness:  Bobby Hobbs is a 39 y.o. very pleasant male patient who presents with the following:  Pt of Percell Miller- virtual visit today for concern of illness Virtual visit, patient location is home, provider location is office.  Patient identity confirmed with 2 factors, he gives consent for virtual visit today Seen by myself in 2015 He had a visit with Percell Miller on 3/11 with moderate persistent asthma, and did a video visit on 3/12 with an NP provider; he took a zpack on 3/12 Pt notes that he was dx with asthma last year He now notes a feeling of having to "force a breath", cough, ST, mild diarrhea No fever noted  He gets tested for covid every 2 weeks for his job He got tested this am- his rapid test should be back soon, if negative a confirmation test will be done.    He notes the has felt sick for about 10 days now "it's not getting any better and it's not getting any worse."  The cough is not generally productive  Pt does not have any history of CAD- his father did have CHF however  He is not checking vitals at home   He is using his inhalers, both albuterol and Symbicort   Patient Active Problem List   Diagnosis Date Noted  . Vomiting 09/06/2019  . Diarrhea 09/06/2019  . Pain in joint of left shoulder 03/27/2019  . Chronic maxillary sinusitis 01/24/2019  . Acute pain of left shoulder 01/30/2018  . Neck pain on left side 01/30/2018  . Muscle spasm 01/30/2018  . Eyelid dermatitis, allergic/contact 09/16/2015  . Rhinitis, allergic 09/16/2015  . Acute gangrenous appendicitis with perforation s/p lap appy 12/28/2014 12/28/2014  . Dental caries 06/03/2013     Past Medical History:  Diagnosis Date  . Allergy   . Diarrhea 09/06/2019  . Vomiting 09/06/2019    Past Surgical History:  Procedure Laterality Date  . LAPAROSCOPIC APPENDECTOMY N/A 12/28/2014   Procedure: APPENDECTOMY LAPAROSCOPIC;  Surgeon: Armandina Gemma, MD;  Location: [facility];  Service: General;  Laterality: N/A;    Social History   Tobacco Use  . Smoking status: Never Smoker  . Smokeless tobacco: Never Used  Substance Use Topics  . Alcohol use: Yes    Alcohol/week: 0.0 standard drinks    Comment: once a year  . Drug use: No    Family History  Problem Relation Age of Onset  . Hypertension Mother   . Diabetes Father   . COPD Father   . Diabetes Maternal Grandmother     Allergies  Allergen Reactions  . Montelukast Other (See Comments)    headache  . Dairy Aid [Lactase]   . Hycodan [Hydrocodone-Homatropine] Nausea And Vomiting    Tolerates hydrocodone with acetaminophen    Medication list has been reviewed and updated.  Current Outpatient Medications on File Prior to Visit  Medication Sig Dispense Refill  . albuterol (VENTOLIN HFA) 108 (90 Base) MCG/ACT inhaler Use 2 inhalations every 4-6 hours as needed for cough and wheezing. 8 g 1  . azithromycin (ZITHROMAX) 250 MG tablet Take 2 tablets on first day. Then take  1 daily. Finish entire supply. 6 tablet 0  . budesonide-formoterol (SYMBICORT) 80-4.5 MCG/ACT inhaler Inhale 2 puffs into the lungs 2 (two) times daily. 1 Inhaler 3  . fluticasone (FLONASE) 50 MCG/ACT nasal spray Place 2 sprays into both nostrils daily. 16 g 6  . guaiFENesin-dextromethorphan (ROBITUSSIN DM) 100-10 MG/5ML syrup Take 5 mLs by mouth every 4 (four) hours as needed for cough. 118 mL 1   No current facility-administered medications on file prior to visit.    Review of Systems:  As per HPI- otherwise negative.   Physical Examination: There were no vitals filed for this visit. There were no vitals filed for this visit. There is no  height or weight on file to calculate BMI. Ideal Body Weight:    Patient observed over video monitor.  He looks well, occasional cough.  No wheezing or shortness of breath, no distress noted  Assessment and Plan: Cough - Plan: predniSONE (DELTASONE) 20 MG tablet   Virtual visit today to discuss illness for about 10 days.  Patient has noticed cough and chest congestion.  He does have history of asthma.  He has been treated with azithromycin and completed course-no improvement as of yet  He was actually tested for COVID-19 this morning, awaiting results.  Discussed with patient in detail.  Certainly if he test positive for COVID-19, that is likely explanation.  Otherwise this may be exacerbation of his chronic asthma.  I will send in a 5-day prescription of prednisone for him to take for asthma.  Asked him to communicate his Covid results when they come in  We discussed his symptom of shortness of breath and chest tightness.  I offered to have him seen at the emergency room for further evaluation of this issue-for the time being he declines, he does not feel that this symptom is very severe and does not wish to be seen at the ER  I did encourage him to seek emergency care right away if he is getting worse Provide a note for his job today  Signed Lamar Blinks, MD

## 2019-12-29 ENCOUNTER — Encounter: Payer: Self-pay | Admitting: Registered Nurse

## 2019-12-29 NOTE — Progress Notes (Signed)
Telemedicine Encounter- SOAP NOTE Established Patient  This telephone encounter was conducted with the patient's (or proxy's) verbal consent via audio telecommunications: yes  Patient was instructed to have this encounter in a suitably private space; and to only have persons present to whom they give permission to participate. In addition, patient identity was confirmed by use of name plus two identifiers (DOB and address).  I discussed the limitations, risks, security and privacy concerns of performing an evaluation and management service by telephone and the availability of in person appointments. I also discussed with the patient that there may be a patient responsible charge related to this service. The patient expressed understanding and agreed to proceed.  I spent a total of 13 minutes talking with the patient or their proxy.  Chief Complaint  Patient presents with  . Follow-up    patient stated he has not got any better since the telemed visit patient stated he has been using the inhaler that you sent for him.    Subjective   Bobby Hobbs is a 39 y.o. established patient. Telephone visit today for ongoing shob and cough  HPI Pt recently seen for COVID symptoms - initially thought to be improving with some residual shob and cough, he now states that things are not improving and there is some production with cough Has been using inhaler and doing supportive care. Denies loc, confusion, dizziness, cyanosis. No nvd  Patient Active Problem List   Diagnosis Date Noted  . Vomiting 09/06/2019  . Diarrhea 09/06/2019  . Pain in joint of left shoulder 03/27/2019  . Chronic maxillary sinusitis 01/24/2019  . Acute pain of left shoulder 01/30/2018  . Neck pain on left side 01/30/2018  . Muscle spasm 01/30/2018  . Eyelid dermatitis, allergic/contact 09/16/2015  . Rhinitis, allergic 09/16/2015  . Acute gangrenous appendicitis with perforation s/p lap appy 12/28/2014 12/28/2014   . Dental caries 06/03/2013    Past Medical History:  Diagnosis Date  . Allergy   . Diarrhea 09/06/2019  . Vomiting 09/06/2019    Current Outpatient Medications  Medication Sig Dispense Refill  . albuterol (VENTOLIN HFA) 108 (90 Base) MCG/ACT inhaler Use 2 inhalations every 4-6 hours as needed for cough and wheezing. 8 g 1  . budesonide-formoterol (SYMBICORT) 80-4.5 MCG/ACT inhaler Inhale 2 puffs into the lungs 2 (two) times daily. 1 Inhaler 3  . fluticasone (FLONASE) 50 MCG/ACT nasal spray Place 2 sprays into both nostrils daily. 16 g 6  . guaiFENesin-dextromethorphan (ROBITUSSIN DM) 100-10 MG/5ML syrup Take 5 mLs by mouth every 4 (four) hours as needed for cough. 118 mL 1  . azithromycin (ZITHROMAX) 250 MG tablet Take 2 tablets on first day. Then take 1 daily. Finish entire supply. 6 tablet 0  . predniSONE (DELTASONE) 20 MG tablet Take 2 tablets (40 mg total) by mouth daily with breakfast for 5 days. 10 tablet 0   No current facility-administered medications for this visit.    Allergies  Allergen Reactions  . Montelukast Other (See Comments)    headache  . Dairy Aid [Lactase]   . Hycodan [Hydrocodone-Homatropine] Nausea And Vomiting    Tolerates hydrocodone with acetaminophen    Social History   Socioeconomic History  . Marital status: Married    Spouse name: Janett Billow  . Number of children: 0  . Years of education: Not on file  . Highest education level: Not on file  Occupational History  . Not on file  Tobacco Use  . Smoking status: Never Smoker  .  Smokeless tobacco: Never Used  Substance and Sexual Activity  . Alcohol use: Yes    Alcohol/week: 0.0 standard drinks    Comment: once a year  . Drug use: No  . Sexual activity: Yes  Other Topics Concern  . Not on file  Social History Narrative  . Not on file   Social Determinants of Health   Financial Resource Strain:   . Difficulty of Paying Living Expenses:   Food Insecurity:   . Worried About Ship broker in the Last Year:   . Arboriculturist in the Last Year:   Transportation Needs:   . Film/video editor (Medical):   Marland Kitchen Lack of Transportation (Non-Medical):   Physical Activity:   . Days of Exercise per Week:   . Minutes of Exercise per Session:   Stress:   . Feeling of Stress :   Social Connections:   . Frequency of Communication with Friends and Family:   . Frequency of Social Gatherings with Friends and Family:   . Attends Religious Services:   . Active Member of Clubs or Organizations:   . Attends Archivist Meetings:   Marland Kitchen Marital Status:   Intimate Partner Violence:   . Fear of Current or Ex-Partner:   . Emotionally Abused:   Marland Kitchen Physically Abused:   . Sexually Abused:     ROS Per hpi   Objective   Vitals as reported by the patient: There were no vitals filed for this visit.  Bobby Hobbs was seen today for follow-up.  Diagnoses and all orders for this visit:  Chest congestion -     azithromycin (ZITHROMAX) 250 MG tablet; Take 2 tablets on first day. Then take 1 daily. Finish entire supply.   PLAN  Concerned for covid pna / cap at this time  z pack  Return to clinic precautions given, ed precautions given  Patient encouraged to call clinic with any questions, comments, or concerns.   I discussed the assessment and treatment plan with the patient. The patient was provided an opportunity to ask questions and all were answered. The patient agreed with the plan and demonstrated an understanding of the instructions.   The patient was advised to call back or seek an in-person evaluation if the symptoms worsen or if the condition fails to improve as anticipated.  I provided 13 minutes of non-face-to-face time during this encounter.  Bobby Coss, NP  Primary Care at [facility]

## 2019-12-30 MED ORDER — DOXYCYCLINE HYCLATE 100 MG PO CAPS: 100.0000 mg | ORAL_CAPSULE | Freq: Two times a day (BID) | ORAL | 0 refills | Status: DC

## 2020-01-01 ENCOUNTER — Ambulatory Visit (HOSPITAL_BASED_OUTPATIENT_CLINIC_OR_DEPARTMENT_OTHER)
Admission: RE | Admit: 2020-01-01 | Discharge: 2020-01-01 | Disposition: A | Discharge status: Home / Self Care | Payer: 59 | Source: Ambulatory Visit | Attending: Medical | Admitting: Medical

## 2020-01-01 ENCOUNTER — Other Ambulatory Visit: Payer: Self-pay

## 2020-01-01 DIAGNOSIS — R062 Wheezing: Secondary | ICD-10-CM

## 2020-01-03 ENCOUNTER — Ambulatory Visit (INDEPENDENT_AMBULATORY_CARE_PROVIDER_SITE_OTHER): Payer: 59 | Admitting: Internal Medicine

## 2020-01-03 VITALS — BP 120/100 | HR 97 | Temp 98.1°F | Ht 73.0 in | Wt 228.4 lb

## 2020-01-03 DIAGNOSIS — R030 Elevated blood-pressure reading, without diagnosis of hypertension: Secondary | ICD-10-CM

## 2020-01-03 DIAGNOSIS — R0600 Dyspnea, unspecified: Secondary | ICD-10-CM

## 2020-01-03 DIAGNOSIS — J3089 Other allergic rhinitis: Secondary | ICD-10-CM | POA: Diagnosis not present

## 2020-01-03 NOTE — Progress Notes (Signed)
Respiratory Clinic Note   Patient: Bobby Hobbs    DOB: 1981/08/20   39 y.o.   MRN: [id number] Visit Date: 01/03/2020  Today's Provider: Pascal Lux MD    Subjective: [id number] of breath    Covid-19 Nucleic Acid Test Results Lab Results  Component Value Date   [place] Not Detected 07/02/2019    HPI 39 year old Hobbs with medical diagnoses of extrinsic rhinitis, chronic maxillary sinusitis, and asthma who presents to the respiratory clinic with daily shortness of breath. He was seen by his PCP 3/11 for recent onset of mild wheezing with severe transient dyspnea on exertion.  He had a history of asthma as a child but was never on inhaled agents.  Symbicort and albuterol were prescribed.  Referral to pulmonary was scheduled. Chest x-ray was performed 3/24 which was negative. He works at the [employer] and is tested every 2 weeks for Covid; all have been negative.  The last test was 3/18. At this time he describes some chills.  He has a chronic sore throat related to postnasal drainage.  He had been prescribed fluticasone intranasally but had not been employing this.  He describes a minor nonproductive cough 1-2 times per day.  His major symptom is shortness of breath at rest and with exertion.  He does not feel the albuterol has been of benefit. He gives a history of "achalasia" for decades.  He describes dysphagia 1-2 times per month.  He has had no GI evaluation per se and basis this diagnosis on the fact that his mother was diagnosed with achalasia and had similar symptoms.   Current Outpatient Medications:  .  albuterol (VENTOLIN HFA) 108 (90 Base) MCG/ACT inhaler, Use 2 inhalations every 4-6 hours as needed for cough and wheezing., Disp: 8 g, Rfl: 1 .  budesonide-formoterol (SYMBICORT) 80-4.5 MCG/ACT inhaler, Inhale 2 puffs into the lungs 2 (two) times daily., Disp: 1 Inhaler, Rfl: 3 .  doxycycline (VIBRAMYCIN) 100 MG capsule, Take 1 capsule (100 mg total)  by mouth 2 (two) times daily., Disp: 20 capsule, Rfl: 0 .  fluticasone (FLONASE) 50 MCG/ACT nasal spray, Place 2 sprays into both nostrils daily., Disp: 16 g, Rfl: 6 .  guaiFENesin-dextromethorphan (ROBITUSSIN DM) 100-10 MG/5ML syrup, Take 5 mLs by mouth every 4 (four) hours as needed for cough., Disp: 118 mL, Rfl: 1  Allergies  Allergen Reactions  . Montelukast Other (See Comments)    headache  . Dairy Aid [Lactase]   . Hycodan [Hydrocodone-Homatropine] Nausea And Vomiting    Tolerates hydrocodone with acetaminophen    Review of Systems  Constitutional: No fever, significant weight change, fatigue  Eyes: No redness, discharge, pain, vision change ENT/mouth: No nasal purulent discharge, earache, change in hearing Cardiovascular: No chest pain, palpitations, paroxysmal nocturnal dyspnea, claudication, edema  Respiratory: No sputum production, hemoptysis Gastrointestinal: No heartburn,  abdominal pain, nausea /vomiting, change in bowels Musculoskeletal: No joint stiffness, joint swelling, weakness, pain Dermatologic: No rash, pruritus, change in appearance of skin Neurologic: No  headache Allergy/immunology: No urticaria, angioedema     Objective:   BP (!) 120/100 (BP Location: Left Arm, Patient Position: Sitting, Cuff Size: Large)   Pulse 97   Temp 98.1 F (36.7 C) (Oral)   Ht 6\' 1"  (1.854 m)   Wt 228 lb 6 oz (103.6 kg)   SpO2 95%   BMI 30.13 kg/m   Physical Exam   Pertinent or positive findings: He has a beard and mustache. The septum is dislocated to the  left.  There is mild to moderate erythema of the right nasal septum.    General appearance: Adequately nourished; no acute distress, increased work of breathing is present.   Lymphatic: No lymphadenopathy about the head, neck, axilla. Eyes: No conjunctival inflammation or lid edema is present. There is no scleral icterus. Ears:  External ear exam shows no significant lesions or deformities.   Nose:  External nasal  examination shows no deformity or inflammation.  Oral exam:  Lips and gums are healthy appearing. There is no oropharyngeal erythema or exudate. Neck:  No thyromegaly, masses, tenderness noted.    Heart:  Normal rate and regular rhythm. S1 and S2 normal without gallop, murmur, click, rub .  Lungs: Chest clear to auscultation without wheezes, rhonchi, rales, rubs. Abdomen: Bowel sounds are normal. Abdomen is soft and nontender with no organomegaly, hernias, masses. Extremities:  No cyanosis, clubbing, edema  Skin: Warm & dry w/o tenting. No significant lesions or rash.   No results found for any visits on 01/03/20.    Assessment & Plan      See summary under each active problem in the Problem List with associated updated therapeutic plan       [place]

## 2020-01-03 NOTE — Patient Instructions (Signed)
The most important intervention to prevent upper respiratory infections such as sinusitis is a regimen of "nasal cleansing" as these actually start in the nasal openings (nares) as an inflammatory process. Whether viral ( the cause of the common "cold") , allergic(typically seasonal) ,or bacterial ( usually a complication of the first two processes rather the initial process); cleansing the nasal passages can interrupt the cycle.  The virus or allergen attaches  to the mucosa causing swelling (edema) and discharge of clear, thick secretions. The body is trying to trap the "invader" to allow its expulsion. The focus should be on halting the process by eliminating the agent, washing it off the mucosa. The secretions are loaded with nutrients that allow bacteria to populate in the sinus cavities resulting in pain , pressure and purulent ( now yellow and green) discharge, signs & symptoms of sinusitis. Here is the technique:Gently scrub the inside of the nostrils in the shower with LATHER of mild shampoo (Ex "baby shampoo").  Do NOT "snort" the lather backwards. After 15-20 seconds of cleansing (two renditions of "Happy Birthday") wash off lather with the shower stream while  EXhaling through nostrils. Make sure that all residual soap lather is removed to prevent irritation.  After showering , clear the nostrils of residual material onto tissue. Generic or house brand or plain Allegra (NOT D )  160 daily , Loratidine 10 mg , OR Zyrtec 10 mg @ bedtime can address symptoms of  itchy eyes & sneezing.  Reflux of gastric acid may be asymptomatic as this may occur mainly during sleep.The triggers for reflux  include stress; the "aspirin family" ; alcohol; peppermint; and caffeine (coffee, tea, cola, and chocolate). The aspirin family would include aspirin and the nonsteroidal agents such as ibuprofen &  Naproxen. Tylenol would not cause reflux. If having symptoms ; food & drink should be avoided for @ least 2 hours  before going to bed.

## 2020-01-06 ENCOUNTER — Telehealth: Payer: Self-pay | Admitting: Medical

## 2020-01-06 DIAGNOSIS — R1310 Dysphagia, unspecified: Secondary | ICD-10-CM

## 2020-01-06 DIAGNOSIS — R0600 Dyspnea, unspecified: Secondary | ICD-10-CM | POA: Insufficient documentation

## 2020-01-06 NOTE — Assessment & Plan Note (Signed)
On exam the nasal septum was dislocated to the left.  There is mild to moderate erythema of the right nasal septum.  He has not been using fluticasone.  I explained the potential role of chronic postnasal drainage in causing cough.  I explained proper technique (crossover) use of fluticasone and encouraged him to at least give it a trial following nasal hygiene measures which were outlined to him.

## 2020-01-06 NOTE — Assessment & Plan Note (Signed)
Pulmonary consultation has been scheduled.  Extensive PFTs are indicated to assess any asthmatic or bronchospastic component to his symptoms. Also his self diagnosis of "achalasia" warrants an in-depth evaluation to rule out significant GERD, esophagitis, and reflux induced asthma.

## 2020-01-06 NOTE — Telephone Encounter (Addendum)
I got note from Dr. Linna Darner who saw pt recently. Pt expressed dysphagia and concern for achalasia. So went ahead and referred to gastorenterologist.  Is pt ok with referral to GI.   Notify pt he could ask GI for appointment 2 weeks after his pulmonologist appointment. Sometimes link between pulmonary dx such as asthma and reflux. Recommend prioritize pulmonologist first.

## 2020-01-07 ENCOUNTER — Encounter: Payer: Self-pay | Admitting: Gastroenterology

## 2020-01-07 NOTE — Telephone Encounter (Signed)
Pt.notified

## 2020-01-08 MED ORDER — PREDNISONE 20 MG PO TABS: 40.0000 mg | ORAL_TABLET | Freq: Every day | ORAL | 0 refills | Status: AC

## 2020-01-08 NOTE — Addendum Note (Signed)
Addended by: Lamar Blinks C on: 01/08/2020 07:54 AM   Modules accepted: Orders

## 2020-01-28 ENCOUNTER — Other Ambulatory Visit: Payer: Self-pay

## 2020-01-28 ENCOUNTER — Encounter: Payer: Self-pay | Admitting: Pulmonary Disease

## 2020-01-28 ENCOUNTER — Ambulatory Visit: Payer: 59 | Admitting: Pulmonary Disease

## 2020-01-28 VITALS — BP 108/68 | HR 95 | Temp 97.2°F | Ht 73.0 in | Wt 233.0 lb

## 2020-01-28 DIAGNOSIS — J4540 Moderate persistent asthma, uncomplicated: Secondary | ICD-10-CM

## 2020-01-28 NOTE — Patient Instructions (Signed)
Moderate persistent asthma Symptoms well controlled at present  Continue using Symbicort on a regular basis Albuterol as needed as you are currently  Call with significant concerns  Tentatively, follow-up in a year  http://www.aaaai.org/conditions-and-treatments/asthma">  Asthma, Adult  Asthma is a long-term (chronic) condition that causes recurrent episodes in which the airways become tight and narrow. The airways are the passages that lead from the nose and mouth down into the lungs. Asthma episodes, also called asthma attacks, can cause coughing, wheezing, shortness of breath, and chest pain. The airways can also fill with mucus. During an attack, it can be difficult to breathe. Asthma attacks can range from minor to life threatening. Asthma cannot be cured, but medicines and lifestyle changes can help control it and treat acute attacks. What are the causes? This condition is believed to be caused by inherited (genetic) and environmental factors, but its exact cause is not known. There are many things that can bring on an asthma attack or make asthma symptoms worse (triggers). Asthma triggers are different for each person. Common triggers include:  Mold.  Dust.  Cigarette smoke.  Cockroaches.  Things that can cause allergy symptoms (allergens), such as animal dander or pollen from trees or grass.  Air pollutants such as household cleaners, wood smoke, smog, or Advertising account planner.  Cold air, weather changes, and winds (which increase molds and pollen in the air).  Strong emotional expressions such as crying or laughing hard.  Stress.  Certain medicines (such as aspirin) or types of medicines (such as beta-blockers).  Sulfites in foods and drinks. Foods and drinks that may contain sulfites include dried fruit, potato chips, and sparkling grape juice.  Infections or inflammatory conditions such as the flu, a cold, or inflammation of the nasal membranes  (rhinitis).  Gastroesophageal reflux disease (GERD).  Exercise or strenuous activity. What are the signs or symptoms? Symptoms of this condition may occur right after asthma is triggered or many hours later. Symptoms include:  Wheezing. This can sound like whistling when you breathe.  Excessive nighttime or early morning coughing.  Frequent or severe coughing with a common cold.  Chest tightness.  Shortness of breath.  Tiredness (fatigue) with minimal activity. How is this diagnosed? This condition is diagnosed based on:  Your medical history.  A physical exam.  Tests, which may include: ? Lung function studies and pulmonary studies (spirometry). These tests can evaluate the flow of air in your lungs. ? Allergy tests. ? Imaging tests, such as X-rays. How is this treated? There is no cure for this condition, but treatment can help control your symptoms. Treatment for asthma usually involves:  Identifying and avoiding your asthma triggers.  Using medicines to control your symptoms. Generally, two types of medicines are used to treat asthma: ? Controller medicines. These help prevent asthma symptoms from occurring. They are usually taken every day. ? Fast-acting reliever or rescue medicines. These quickly relieve asthma symptoms by widening the narrow and tight airways. They are used as needed and provide short-term relief.  Using supplemental oxygen. This may be needed during a severe episode.  Using other medicines, such as: ? Allergy medicines, such as antihistamines, if your asthma attacks are triggered by allergens. ? Immune medicines (immunomodulators). These are medicines that help control the immune system.  Creating an asthma action plan. An asthma action plan is a written plan for managing and treating your asthma attacks. This plan includes: ? A list of your asthma triggers and how to avoid them. ? Information  about when medicines should be taken and when their  dosage should be changed. ? Instructions about using a device called a peak flow meter. A peak flow meter measures how well the lungs are working and the severity of your asthma. It helps you monitor your condition. Follow these instructions at home: Controlling your home environment Control your home environment in the following ways to help avoid triggers and prevent asthma attacks:  Change your heating and air conditioning filter regularly.  Limit your use of fireplaces and wood stoves.  Get rid of pests (such as roaches and mice) and their droppings.  Throw away plants if you see mold on them.  Clean floors and dust surfaces regularly. Use unscented cleaning products.  Try to have someone else vacuum for you regularly. Stay out of rooms while they are being vacuumed and for a short while afterward. If you vacuum, use a dust mask from a hardware store, a double-layered or microfilter vacuum cleaner bag, or a vacuum cleaner with a HEPA filter.  Replace carpet with wood, tile, or vinyl flooring. Carpet can trap dander and dust.  Use allergy-proof pillows, mattress covers, and box spring covers.  Keep your bedroom a trigger-free room.  Avoid pets and keep windows closed when allergens are in the air.  Wash beddings every week in hot water and dry them in a dryer.  Use blankets that are made of polyester or cotton.  Clean bathrooms and kitchens with bleach. If possible, have someone repaint the walls in these rooms with mold-resistant paint. Stay out of the rooms that are being cleaned and painted.  Wash your hands often with soap and water. If soap and water are not available, use hand sanitizer.  Do not allow anyone to smoke in your home. General instructions  Take over-the-counter and prescription medicines only as told by your health care provider. ? Speak with your health care provider if you have questions about how or when to take the medicines. ? Make note if you are  requiring more frequent dosages.  Do not use any products that contain nicotine or tobacco, such as cigarettes and e-cigarettes. If you need help quitting, ask your health care provider. Also, avoid being exposed to secondhand smoke.  Use a peak flow meter as told by your health care provider. Record and keep track of the readings.  Understand and use the asthma action plan to help minimize, or stop an asthma attack, without needing to seek medical care.  Make sure you stay up to date on your yearly vaccinations as told by your health care provider. This may include vaccines for the flu and pneumonia.  Avoid outdoor activities when allergen counts are high and when air quality is low.  Wear a ski mask that covers your nose and mouth during outdoor winter activities. Exercise indoors on cold days if you can.  Warm up before exercising, and take time for a cool-down period after exercise.  Keep all follow-up visits as told by your health care provider. This is important. Where to find more information  For information about asthma, turn to the Centers for Disease Control and Prevention at http://www.clark.net/.htm  For air quality information, turn to [employer] at WeightRating.nl Contact a health care provider if:  You have wheezing, shortness of breath, or a cough even while you are taking medicine to prevent attacks.  The mucus you cough up (sputum) is thicker than usual.  Your sputum changes from clear or white to yellow, green, gray,  or bloody.  Your medicines are causing side effects, such as a rash, itching, swelling, or trouble breathing.  You need to use a reliever medicine more than 2-3 times a week.  Your peak flow reading is still at 50-79% of your personal best after following your action plan for 1 hour.  You have a fever. Get help right away if:  You are getting worse and do not respond to treatment during an asthma attack.  You are short of breath when at rest  or when doing very little physical activity.  You have difficulty eating, drinking, or talking.  You have chest pain or tightness.  You develop a fast heartbeat or palpitations.  You have a bluish color to your lips or fingernails.  You are light-headed or dizzy, or you faint.  Your peak flow reading is less than 50% of your personal best.  You feel too tired to breathe normally. Summary  Asthma is a long-term (chronic) condition that causes recurrent episodes in which the airways become tight and narrow. These episodes can cause coughing, wheezing, shortness of breath, and chest pain.  Asthma cannot be cured, but medicines and lifestyle changes can help control it and treat acute attacks.  Make sure you understand how to avoid triggers and how and when to use your medicines.  Asthma attacks can range from minor to life threatening. Get help right away if you have an asthma attack and do not respond to treatment with your usual rescue medicines. This information is not intended to replace advice given to you by your health care provider. Make sure you discuss any questions you have with your health care provider. Document Revised: 11/29/2018 Document Reviewed: 10/31/2016 Elsevier Patient Education  2020 [employer].

## 2020-01-28 NOTE — Progress Notes (Signed)
[place]    QN:[id number]    1981/02/25  Primary Care Physician:Saguier, Iris Pert  Referring Physician: Mackie Pai, PA-C [place],  [place] [address]  Chief complaint:   Patient with a history of asthma  HPI:  Was having significant problems with his asthma recently in the last 3 to 4 weeks , has become more stable recently since he started using Symbicort Albuterol use was decreased from daily use to about once every 3 to 4 days and only when he is significantly exerted himself  Asthma is lifelong Only recently started having more problems with it Does not recollect recent exposure or changes in his immediate environment No pets  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 albuterol (VENTOLIN HFA) 108 (90 Base) MCG/ACT inhaler Use 2 inhalations every 4-6 hours as needed for cough and wheezing.  . budesonide-formoterol (SYMBICORT) 80-4.5 MCG/ACT inhaler Inhale 2 puffs into the lungs 2 (two) times daily.  . fluticasone (FLONASE) 50 MCG/ACT nasal spray Place 2 sprays into both nostrils daily.  Marland Kitchen loratadine (CLARITIN) 10 MG tablet Take 10 mg by mouth daily.  . [DISCONTINUED] doxycycline (VIBRAMYCIN) 100 MG capsule Take 1 capsule (100 mg total) by mouth 2 (two) times daily.  . [DISCONTINUED] guaiFENesin-dextromethorphan (ROBITUSSIN DM) 100-10 MG/5ML syrup Take 5 mLs by mouth every 4 (four) hours as needed for cough.   No facility-administered encounter medications on file as of 01/28/2020.    Allergies as of 01/28/2020 - Review Complete 01/28/2020  Allergen Reaction Noted  . Montelukast Other (See Comments) 12/17/2019  . Dairy aid [lactase]  12/26/2014  . Hycodan  [hydrocodone-homatropine] Nausea And Vomiting 12/19/2011    Past Medical History:  Diagnosis Date  . Allergy   . Diarrhea 09/06/2019  . Vomiting 09/06/2019    Past Surgical History:  Procedure Laterality Date  . LAPAROSCOPIC APPENDECTOMY N/A 12/28/2014   Procedure: APPENDECTOMY LAPAROSCOPIC;  Surgeon: Armandina Gemma, MD;  Location: [facility];  Service: General;  Laterality: N/A;    Family History  Problem Relation Age of Onset  . Hypertension Mother   . Diabetes Father   . COPD Father   . Diabetes Maternal Grandmother     Social History   Socioeconomic History  . Marital status: Married    Spouse name: Janett Billow  . Number of children: 0  . Years of education: Not on file  . Highest education level: Not on file  Occupational History  . Not on file  Tobacco Use  . Smoking status: Never Smoker  . Smokeless tobacco: Never Used  Substance and Sexual Activity  . Alcohol use: Yes    Alcohol/week: 0.0 standard drinks    Comment: once a year  . Drug use: No  . Sexual activity: Yes  Other Topics Concern  . Not on file  Social History Narrative  . Not on file   Social Determinants of Health   Financial Resource Strain:   . Difficulty of Paying Living Expenses:   Food Insecurity:   . Worried About Charity fundraiser in the Last Year:   . Arboriculturist in the Last Year:   Transportation Needs:   . Lack of Transportation (  Medical):   Marland Kitchen Lack of Transportation (Non-Medical):   Physical Activity:   . Days of Exercise per Week:   . Minutes of Exercise per Session:   Stress:   . Feeling of Stress :   Social Connections:   . Frequency of Communication with Friends and Family:   . Frequency of Social Gatherings with Friends and Family:   . Attends Religious Services:   . Active Member of Clubs or Organizations:   . Attends Archivist Meetings:   Marland Kitchen Marital Status:   Intimate Partner Violence:   . Fear of Current or Ex-Partner:   . Emotionally Abused:   Marland Kitchen  Physically Abused:   . Sexually Abused:     Review of Systems  Constitutional: Negative.   HENT: Negative.   Eyes: Negative.   Respiratory: Positive for shortness of breath.   Cardiovascular: Negative.   Gastrointestinal: Negative.   Genitourinary: Negative.   Musculoskeletal: Negative.     Vitals:   01/28/20 0925  BP: 108/68  Pulse: 95  Temp: (!) 97.2 F (36.2 C)  SpO2: 95%     Physical Exam  Constitutional: He is oriented to person, place, and time. He appears well-developed and well-nourished.  HENT:  Head: Normocephalic and atraumatic.  Eyes: Pupils are equal, round, and reactive to light. Conjunctivae are normal. Right eye exhibits no discharge. Left eye exhibits no discharge.  Neck: No tracheal deviation present. No thyromegaly present.  Cardiovascular: Normal rate and regular rhythm.  Pulmonary/Chest: Effort normal and breath sounds normal. No respiratory distress. He has no wheezes. He has no rales. He exhibits no tenderness.  Musculoskeletal:     Cervical back: Normal range of motion and neck supple.  Neurological: He is alert and oriented to person, place, and time. No cranial nerve deficit.  Skin: Skin is warm and dry.  Psychiatric: He has a normal mood and affect.     Data Reviewed: Recent chest x-ray performed 01/01/2020 reviewed by myself showing no acute infiltrate  Assessment:  Moderate persistent asthma -Symptoms better controlled with use of Symbicort -Infrequent use of albuterol  No significant underlying triggers  No other significant medical problems Occasional sinus infections  Plan/Recommendations: Continue Symbicort  May need to escalate Symbicort to higher dose if symptoms are not well controlled  Continue albuterol use as needed  I will see you back in about a year  Call with significant concerns  Avoid any identifiable triggers   Sherrilyn Rist MD [facility] 01/28/2020, 9:38 AM  CC: Mackie Pai, PA-C

## 2020-02-10 ENCOUNTER — Other Ambulatory Visit: Payer: Self-pay

## 2020-02-10 ENCOUNTER — Encounter: Payer: Self-pay | Admitting: Family Medicine

## 2020-02-10 ENCOUNTER — Ambulatory Visit: Payer: 59 | Admitting: Family Medicine

## 2020-02-10 VITALS — BP 108/74 | HR 63 | Temp 97.3°F | Resp 17 | Ht 73.0 in | Wt 230.0 lb

## 2020-02-10 DIAGNOSIS — S46911A Strain of unspecified muscle, fascia and tendon at shoulder and upper arm level, right arm, initial encounter: Secondary | ICD-10-CM

## 2020-02-10 MED ORDER — CYCLOBENZAPRINE HCL 10 MG PO TABS: 10.0000 mg | ORAL_TABLET | Freq: Two times a day (BID) | ORAL | 0 refills | Status: DC | PRN

## 2020-02-10 MED ORDER — MELOXICAM 15 MG PO TABS: 15.0000 mg | ORAL_TABLET | Freq: Every day | ORAL | 0 refills | Status: DC

## 2020-02-10 NOTE — Progress Notes (Addendum)
[facility] at [facility] [address] [place], [place] [address] [phone]  Date:  02/10/2020   Name:  Bobby Hobbs   DOB:  06-07-1981   MRN:  [id number]  PCP:  Mackie Pai, PA-C    Chief Complaint: Shoulder Pain (right shoulder/back pain, 2 days, no known injury)   History of Present Illness:  Bobby Hobbs is a 39 y.o. very pleasant male patient who presents with the following:  Patient with history of sinusitis and rhinitis is, ruptured appendicitis with appendectomy in 2016 We did a virtual visit together on March 18 for illness He tested negative for COVID-19 at that time, but had some trouble with protracted coughing and wheezing symptoms.  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 Dyspnea 01/06/2020  . Vomiting 09/06/2019  . Diarrhea 09/06/2019  . Pain in joint of left shoulder 03/27/2019  . Chronic maxillary sinusitis 01/24/2019  . Acute pain of left shoulder 01/30/2018  . Neck pain on left side 01/30/2018  . Muscle spasm 01/30/2018  . Eyelid dermatitis, allergic/contact 09/16/2015  . Rhinitis, allergic 09/16/2015  . Acute gangrenous appendicitis with perforation s/p lap appy 12/28/2014 12/28/2014  . Dental caries 06/03/2013    Past Medical History:  Diagnosis Date  .  Allergy   . Diarrhea 09/06/2019  . Vomiting 09/06/2019    Past Surgical History:  Procedure Laterality Date  . LAPAROSCOPIC APPENDECTOMY N/A 12/28/2014   Procedure: APPENDECTOMY LAPAROSCOPIC;  Surgeon: Armandina Gemma, MD;  Location: [facility];  Service: General;  Laterality: N/A;    Social History   Tobacco Use  . Smoking status: Never Smoker  . Smokeless tobacco: Never Used  Substance Use Topics  . Alcohol use: Yes    Alcohol/week: 0.0 standard drinks    Comment: once a year  . Drug use: No    Family History  Problem Relation Age of Onset  . Hypertension Mother   . Diabetes Father   . COPD Father   . Diabetes Maternal Grandmother     Allergies  Allergen Reactions  . Montelukast Other (See Comments)    headache  . Dairy Aid [Lactase]   . Hycodan [Hydrocodone-Homatropine] Nausea And Vomiting    Tolerates hydrocodone with acetaminophen    Medication list has been reviewed and updated.  Current Outpatient Medications on File Prior to Visit  Medication Sig Dispense Refill  . albuterol (VENTOLIN HFA) 108 (90 Base) MCG/ACT inhaler Use 2 inhalations every 4-6 hours as needed for cough and wheezing. 8 g 1  . budesonide-formoterol (SYMBICORT) 80-4.5 MCG/ACT inhaler Inhale 2 puffs into the lungs 2 (two) times daily. 1 Inhaler 3  . fluticasone (FLONASE) 50 MCG/ACT nasal spray  Place 2 sprays into both nostrils daily. 16 g 6  . loratadine (CLARITIN) 10 MG tablet Take 10 mg by mouth daily.     No current facility-administered medications on file prior to visit.    Review of Systems:  As per HPI- otherwise negative.   Physical Examination: Vitals:   02/10/20 1415  BP: 108/74  Pulse: 63  Resp: 17  Temp: (!) 97.3 F (36.3 C)  SpO2: 97%   Vitals:   02/10/20 1415  Weight: 230 lb (104.3 kg)  Height: 6\' 1"  (1.854 m)   Body mass index is 30.34 kg/m. Ideal Body Weight: Weight in (lb) to have BMI = 25: 189.1  GEN: no acute distress.  Overweight, otherwise looks  well HEENT: Atraumatic, Normocephalic.  Bilateral TM wnl, oropharynx normal.  PEERL,EOMI.   He does have some edema in the pinna of the left ear.  No injury, it is not painful.  He notes it is a bit itchy. Ears and Nose: No external deformity. CV: RRR, No M/G/R. No JVD. No thrill. No extra heart sounds. PULM: CTA B, no wheezes, crackles, rhonchi. No retractions. No resp. distress. No accessory muscle use. ABD: S, NT, ND, +BS. No rebound. No HSM. EXTR: No c/c/e PSYCH: Normally interactive. Conversant.  He is tender at the medial border of the right scapula, and has discomfort with full shoulder extension and flexion.  Internal and external rotation are normal.  Positive Neer's and Hawkins testing.  No weakness, normal DTR of both upper extremities   Assessment and Plan: Strain of right shoulder, initial encounter - Plan: meloxicam (MOBIC) 15 MG tablet, cyclobenzaprine (FLEXERIL) 10 MG tablet  Here today with concern of probable right shoulder strain.  He is not aware of any particular injury, but he does a physically demanding job. We will have him use ice up to 3 times a day as tolerated, meloxicam 15 mg as needed, cyclobenzaprine as needed.  I given him a handout with stretches and exercises to do for rotator cuff tendinitis.  Offered sports medicine referral, he prefers to wait and see how he does over the next couple of weeks.  He will let me know if not improved in the next 1 to 2 weeks, sooner if worse  Incidentally noted issue with his left ear as above.  No injury, so this should not be a hematoma.  Meloxicam may be helpful as an anti-inflammatory.  He will also let me know if this is not getting better or if it is getting worse  Moderate medical decision making today  This visit occurred during the SARS-CoV-2 public health emergency.  Safety protocols were in place, including screening questions prior to the visit, additional usage of staff PPE, and extensive cleaning of exam room  while observing appropriate contact time as indicated for disinfecting solutions.    Signed Lamar Blinks, MD  addnd 5/6- pt came back in with concern of increased redness and swelling of the left ear The swelling of the superior left pinna is significantly worse.  Needs ENT evaluation asap- I am concerned that he could be in danger of cauliflower ear deformity Right shoulder is much better   Pt is free until 3 pm tomorrow  Made him an appt tomorrow at 11:30 am with Dr Redmond Baseman - called pt and relayed this info   [address], [place], [place] [address]

## 2020-02-10 NOTE — Patient Instructions (Addendum)
It was good to see you today  You have rotator cuff tendinitis or another musculoskeletal injury of your right shoulder.  I gave you meloxicam to use daily as an anti-inflammatory, and Flexeril which is a muscle relaxer to use as needed.  Do not use Flexeril when you need to drive  Ice may be helpful, you might try applying an ice pack for about 15 minutes 3 times a day as tolerated  I also gave you some exercises and stretches to work on.  Please try to do about 10 to 15 minutes of exercises 4-5 times a week  If your shoulder continues to bother you in about 2 weeks please let me know, sooner if it is getting worse

## 2020-02-11 ENCOUNTER — Ambulatory Visit: Payer: 59 | Admitting: Gastroenterology

## 2020-02-11 ENCOUNTER — Encounter: Payer: Self-pay | Admitting: Gastroenterology

## 2020-02-11 VITALS — BP 110/80 | HR 73 | Temp 97.5°F | Ht 73.0 in | Wt 232.1 lb

## 2020-02-11 DIAGNOSIS — R1319 Other dysphagia: Secondary | ICD-10-CM

## 2020-02-11 DIAGNOSIS — Z01818 Encounter for other preprocedural examination: Secondary | ICD-10-CM | POA: Diagnosis not present

## 2020-02-11 DIAGNOSIS — K219 Gastro-esophageal reflux disease without esophagitis: Secondary | ICD-10-CM | POA: Diagnosis not present

## 2020-02-11 DIAGNOSIS — R1310 Dysphagia, unspecified: Secondary | ICD-10-CM | POA: Diagnosis not present

## 2020-02-11 MED ORDER — PANTOPRAZOLE SODIUM 40 MG PO TBEC: 40.0000 mg | DELAYED_RELEASE_TABLET | Freq: Every day | ORAL | 11 refills | Status: DC

## 2020-02-11 NOTE — Progress Notes (Signed)
Chief Complaint: dysphagia  Referring Provider:  Mackie Pai, PA-C      ASSESSMENT AND PLAN;    #1. Eso dysphagia. D/d includes eso stricture, Schatzki's ring, motility disorder, EoE, pill induced esophagitis, r/o esophageal carcinoma/extrinsic lesions or Achalasia. FH   #2.  Intermittent diarrhea likely IBS-D.  Doubt IBD.  Plan: -Protonix 40 mg p.o. QD, 1/2 hr before breakfast. #30.  11 refills. -Ba swallow with Ba tablet as well.  Please do lateral films as well. -EGD with eso/SB bx and possibly dil.  Discussed risks and benefits including small but definite risks of eso perforation, bleeding, risks of anesthesia.  The benefits were also discussed. Consent forms given. -I have instructed patient to chew foods especially meats well and eat slowly. -Diarrhea is only intermittent.  Hold off on any work-up for now.  If problems in the future, check stool studies including stool for calprotectin.  HPI:    Bobby Hobbs is a 39 y.o. male  Dysphagia to solids - upper chest- x since age 73, intermiittent, occasional self disimpaction.  Is not getting any worse. No heartburn.  Does happen when he is under stress and eats fast.  No problems with liquids.  Diagnosed with asthma and is being followed by pulmonary.  Advised GI work-up.  Also has intermittent diarrhea - 2/day on a bad day, since appendectomy d/t gangrenous appendicitis 2016. More with stress. No nocturnal symptoms. No abdominal or rectal pain.  No melena or hematochezia.  Denies having any significant abdominal bloating.  Does not bother him much at night.  Mom has achalasia  Has been taking meloxicam for shoulder sprain.  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 Past Medical History:  Diagnosis Date  . Allergy   . Asthma   . Diarrhea 09/06/2019  . Vomiting 09/06/2019    Past Surgical History:  Procedure Laterality Date  . LAPAROSCOPIC APPENDECTOMY N/A 12/28/2014   Procedure: APPENDECTOMY LAPAROSCOPIC;   Surgeon: Armandina Gemma, MD;  Location: [facility];  Service: General;  Laterality: N/A;    Family History  Problem Relation Age of Onset  . Hypertension Mother   . Diabetes Father   . COPD Father   . Diabetes Maternal Grandmother   . Colon cancer Neg Hx   . Esophageal cancer Neg Hx     Social History   Tobacco Use  . Smoking status: Never Smoker  . Smokeless tobacco: Never Used  Substance Use Topics  . Alcohol use: Yes    Alcohol/week: 0.0 standard drinks    Comment: once a year  . Drug use: No    Current Outpatient Medications  Medication Sig Dispense Refill  . albuterol (VENTOLIN HFA) 108 (90 Base) MCG/ACT inhaler Use 2 inhalations every 4-6 hours as needed for cough and wheezing. 8 g 1  . budesonide-formoterol (SYMBICORT) 80-4.5 MCG/ACT inhaler Inhale 2 puffs into the lungs 2 (two) times daily. 1 Inhaler 3  . cyclobenzaprine (FLEXERIL) 10 MG tablet Take 1 tablet (10 mg total) by mouth 2 (two) times daily as needed for muscle spasms. 30 tablet 0  . fluticasone (FLONASE) 50 MCG/ACT nasal spray Place 2 sprays into both nostrils daily. 16 g 6  . loratadine (CLARITIN) 10 MG tablet Take 10 mg by mouth daily.    . meloxicam (MOBIC) 15 MG tablet Take 1 tablet (15 mg total) by mouth daily. Use as needed for shoulder pain 30 tablet 0   No current facility-administered medications for this visit.    Allergies  Allergen Reactions  .  Montelukast Other (See Comments)    headache  . Dairy Aid [Lactase]   . Hycodan [Hydrocodone-Homatropine] Nausea And Vomiting    Tolerates hydrocodone with acetaminophen    Review of Systems:  Constitutional: Denies fever, chills, diaphoresis, appetite change and fatigue.  HEENT: Denies photophobia, eye pain, redness, hearing loss, ear pain, congestion, sore throat, rhinorrhea, sneezing, mouth sores, neck pain, neck stiffness and tinnitus.   Respiratory: Denies SOB, DOE, cough, chest tightness,  and wheezing.   Cardiovascular: Denies chest pain,  palpitations and leg swelling.  Genitourinary: Denies dysuria, urgency, frequency, hematuria, flank pain and difficulty urinating.  Musculoskeletal: Denies myalgias, back pain, joint swelling, arthralgias and gait problem.  Skin: No rash.  Neurological: Denies dizziness, seizures, syncope, weakness, light-headedness, numbness and headaches.  Hematological: Denies adenopathy. Easy bruising, personal or family bleeding history  Psychiatric/Behavioral: No anxiety or depression     Physical Exam:    BP 110/80   Pulse 73   Temp (!) 97.5 F (36.4 C)   Ht 6\' 1"  (1.854 m)   Wt 232 lb 2 oz (105.3 kg)   BMI 30.63 kg/m  Wt Readings from Last 3 Encounters:  02/11/20 232 lb 2 oz (105.3 kg)  02/10/20 230 lb (104.3 kg)  01/28/20 233 lb (105.7 kg)   Constitutional:  Well-developed, in no acute distress. Psychiatric: Normal mood and affect. Behavior is normal. HEENT: Pupils normal.  Conjunctivae are normal. No scleral icterus. Neck supple.  Cardiovascular: Normal rate, regular rhythm. No edema Pulmonary/chest: Effort normal and breath sounds normal. No wheezing, rales or rhonchi. Abdominal: Soft, nondistended. Nontender. Bowel sounds active throughout. There are no masses palpable. No hepatomegaly. Rectal:  defered Neurological: Alert and oriented to person place and time. Skin: Skin is warm and dry. No rashes noted.  Data Reviewed: I have personally reviewed following labs and imaging studies  CBC: CBC Latest Ref Rng & Units 12/16/2016 12/31/2015 12/30/2014  WBC 4.6 - 10.2 K/uL 11.0(A) 7.4 9.6  Hemoglobin 14.1 - 18.1 g/dL 17.0 16.0 13.1  Hematocrit 43.5 - 53.7 % 49.4 45.7 39.7  Platelets 150 - 400 K/uL - - 202    CMP: CMP Latest Ref Rng & Units 12/16/2016 12/31/2014 12/30/2014  Glucose 65 - 99 mg/dL 86 117(H) 158(H)  BUN 6 - 20 mg/dL 9 16 13   Creatinine 0.76 - 1.27 mg/dL 1.07 1.00 0.83  Sodium 134 - 144 mmol/L 140 138 135  Potassium 3.5 - 5.2 mmol/L 3.9 3.4(L) 3.1(L)  Chloride 96 - 106  mmol/L 97 101 98  CO2 18 - 29 mmol/L 24 29 26   Calcium 8.7 - 10.2 mg/dL 9.4 8.0(L) 8.1(L)  Total Protein 6.0 - 8.3 g/dL - - -  Total Bilirubin 0.3 - 1.2 mg/dL - - -  Alkaline Phos 39 - 117 U/L - - -  AST 0 - 37 U/L - - -  ALT 0 - 53 U/L - - -      Carmell Austria, MD 02/11/2020, 2:02 PM  Cc: Mackie Pai, PA-C

## 2020-02-11 NOTE — Patient Instructions (Signed)
If you are age 39 or older, your body mass index should be between 23-30. Your Body mass index is 30.63 kg/m. If this is out of the aforementioned range listed, please consider follow up with your Primary Care Provider.  If you are age 39 or younger, your body mass index should be between 19-25. Your Body mass index is 30.63 kg/m. If this is out of the aformentioned range listed, please consider follow up with your Primary Care Provider.   You have been scheduled for a Barium Esophogram at [facility] (1st floor of the hospital) on 02/25/20 at 10:30am. Please arrive 15 minutes prior to your appointment for registration. Make certain not to have anything to eat or drink 3 hours prior to your test. If you need to reschedule for any reason, please contact radiology at [phone] to do so. __________________________________________________________________ A barium swallow is an examination that concentrates on views of the esophagus. This tends to be a double contrast exam (barium and two liquids which, when combined, create a gas to distend the wall of the oesophagus) or single contrast (non-ionic iodine based). The study is usually tailored to your symptoms so a good history is essential. Attention is paid during the study to the form, structure and configuration of the esophagus, looking for functional disorders (such as aspiration, dysphagia, achalasia, motility and reflux) EXAMINATION You may be asked to change into a gown, depending on the type of swallow being performed. A radiologist and radiographer will perform the procedure. The radiologist will advise you of the type of contrast selected for your procedure and direct you during the exam. You will be asked to stand, sit or lie in several different positions and to hold a small amount of fluid in your mouth before being asked to swallow while the imaging is performed .In some instances you may be asked to swallow barium coated  marshmallows to assess the motility of a solid food bolus. The exam can be recorded as a digital or video fluoroscopy procedure. POST PROCEDURE It will take 1-2 days for the barium to pass through your system. To facilitate this, it is important, unless otherwise directed, to increase your fluids for the next 24-48hrs and to resume your normal diet.  This test typically takes about 30 minutes to perform. __________________________________________________________________________________  Dennis Bast have been scheduled for an endoscopy. Please follow written instructions given to you at your visit today. If you use inhalers (even only as needed), please bring them with you on the day of your procedure. Your physician has requested that you go to www.startemmi.com and enter the access code given to you at your visit today. This web site gives a general overview about your procedure. However, you should still follow specific instructions given to you by our office regarding your preparation for the procedure.  We have sent the following medications to your pharmacy for you to pick up at your convenience: Protonix 40 mg   Thank you,  Dr. Jackquline Denmark

## 2020-02-12 ENCOUNTER — Encounter: Payer: Self-pay | Admitting: Family Medicine

## 2020-02-17 ENCOUNTER — Encounter: Payer: Self-pay | Admitting: Gastroenterology

## 2020-02-25 ENCOUNTER — Other Ambulatory Visit: Payer: Self-pay

## 2020-02-25 ENCOUNTER — Ambulatory Visit (HOSPITAL_COMMUNITY)
Admission: RE | Admit: 2020-02-25 | Discharge: 2020-02-25 | Disposition: A | Discharge status: Home / Self Care | Payer: 59 | Source: Ambulatory Visit | Attending: Gastroenterology | Admitting: Gastroenterology

## 2020-02-25 DIAGNOSIS — K219 Gastro-esophageal reflux disease without esophagitis: Secondary | ICD-10-CM | POA: Diagnosis present

## 2020-02-25 DIAGNOSIS — R1310 Dysphagia, unspecified: Secondary | ICD-10-CM | POA: Insufficient documentation

## 2020-02-25 DIAGNOSIS — R1319 Other dysphagia: Secondary | ICD-10-CM

## 2020-02-27 ENCOUNTER — Other Ambulatory Visit: Payer: Self-pay

## 2020-02-27 ENCOUNTER — Ambulatory Visit (INDEPENDENT_AMBULATORY_CARE_PROVIDER_SITE_OTHER): Payer: 59

## 2020-02-27 ENCOUNTER — Other Ambulatory Visit: Payer: Self-pay | Admitting: Gastroenterology

## 2020-02-27 DIAGNOSIS — Z1159 Encounter for screening for other viral diseases: Secondary | ICD-10-CM

## 2020-02-28 LAB — SARS CORONAVIRUS 2 (TAT 6-24 HRS): SARS Coronavirus 2: NEGATIVE

## 2020-03-02 ENCOUNTER — Ambulatory Visit (AMBULATORY_SURGERY_CENTER): Payer: 59 | Admitting: Gastroenterology

## 2020-03-02 ENCOUNTER — Encounter: Payer: Self-pay | Admitting: Gastroenterology

## 2020-03-02 ENCOUNTER — Other Ambulatory Visit: Payer: Self-pay

## 2020-03-02 VITALS — BP 107/64 | HR 75 | Temp 96.2°F | Resp 13 | Ht 73.0 in | Wt 232.0 lb

## 2020-03-02 DIAGNOSIS — K2970 Gastritis, unspecified, without bleeding: Secondary | ICD-10-CM

## 2020-03-02 DIAGNOSIS — K449 Diaphragmatic hernia without obstruction or gangrene: Secondary | ICD-10-CM | POA: Diagnosis not present

## 2020-03-02 DIAGNOSIS — R1310 Dysphagia, unspecified: Secondary | ICD-10-CM | POA: Diagnosis not present

## 2020-03-02 DIAGNOSIS — D132 Benign neoplasm of duodenum: Secondary | ICD-10-CM | POA: Diagnosis not present

## 2020-03-02 DIAGNOSIS — K222 Esophageal obstruction: Secondary | ICD-10-CM | POA: Diagnosis not present

## 2020-03-02 DIAGNOSIS — K219 Gastro-esophageal reflux disease without esophagitis: Secondary | ICD-10-CM | POA: Diagnosis not present

## 2020-03-02 DIAGNOSIS — K3189 Other diseases of stomach and duodenum: Secondary | ICD-10-CM | POA: Diagnosis not present

## 2020-03-02 DIAGNOSIS — K2100 Gastro-esophageal reflux disease with esophagitis, without bleeding: Secondary | ICD-10-CM

## 2020-03-02 MED ORDER — SODIUM CHLORIDE 0.9 % IV SOLN: 500.0000 mL | Freq: Once | INTRAVENOUS | Status: DC

## 2020-03-02 NOTE — Op Note (Signed)
[facility] Patient Name: Bobby Hobbs Procedure Date: 03/02/2020 9:35 AM MRN: [id number] Endoscopist: Jackquline Denmark , MD Age: 39 Referring MD:  Date of Birth: 1980-12-14 Gender: Male Account #: [id number] Procedure:                Upper GI endoscopy Indications:              Dysphagia, Ba swallow showing Schatzki's ring and                            hold-up of barium tablet at the lower esophagus. Medicines:                Monitored Anesthesia Care Procedure:                Pre-Anesthesia Assessment:                           - Prior to the procedure, a History and Physical                            was performed, and patient medications and                            allergies were reviewed. The patient's tolerance of                            previous anesthesia was also reviewed. The risks                            and benefits of the procedure and the sedation                            options and risks were discussed with the patient.                            All questions were answered, and informed consent                            was obtained. Prior Anticoagulants: The patient has                            taken no previous anticoagulant or antiplatelet                            agents. ASA Grade Assessment: II - A patient with                            mild systemic disease. After reviewing the risks                            and benefits, the patient was deemed in                            satisfactory condition to undergo the procedure.  After obtaining informed consent, the endoscope was                            passed under direct vision. Throughout the                            procedure, the patient's blood pressure, pulse, and                            oxygen saturations were monitored continuously. The                            Endoscope was introduced through the mouth, and   advanced to the second part of duodenum. The upper                            GI endoscopy was accomplished without difficulty.                            The patient tolerated the procedure well. Scope In: Scope Out: Findings:                 The esophagus was normal with fine circular furrows                            ? Importance. Multiple (over 6) biopsies were                            obtained from proximal and midesophagus to r/o EoE.                            A mild Schatzki ring was found at GE junction, 40                            cm from the incisors with luminal diameter of appox                            14 mm. Biopsies were taken with a cold forceps for                            histology at the GE junction and sent in a separate                            container. The scope was withdrawn. Dilation was                            performed with a Maloney dilator with mild                            resistance at 50 Fr and 52 Fr.                           A small hiatal hernia was present.  Localized mild inflammation characterized by                            erythema was found in the gastric antrum. Biopsies                            were taken with a cold forceps for histology.                           The examined duodenum was normal. Biopsies for                            histology were taken with a cold forceps for                            evaluation of celiac disease. Complications:            No immediate complications. Estimated Blood Loss:     Estimated blood loss: none. Impression:               - Mild Schatzki ring. Biopsied. Dilated.                           - Small hiatal hernia.                           - Gastritis. Biopsied.                           - Normal examined duodenum. Biopsied. Recommendation:           - Patient has a contact number available for                            emergencies. The signs and symptoms  of potential                            delayed complications were discussed with the                            patient. Return to normal activities tomorrow.                            Written discharge instructions were provided to the                            patient.                           - Post dilatation diet.                           - Continue Protonix 40 mg p.o. once a day                           - Await pathology results.                           -  Brochures regarding GERD.                           - I instructed patient that he needs to chew foods                            specially meats and breads well and eat slowly.                           - The findings and recommendations were discussed                            with the patient's mother.                           - Return to GI clinic in 12 weeks. Jackquline Denmark, MD 03/02/2020 9:58:25 AM This report has been signed electronically.

## 2020-03-02 NOTE — Patient Instructions (Signed)
Take your medication as ordered.  This is very important.  Read all of the handouts given to you by your recovery room nurse.  YOU HAD AN ENDOSCOPIC PROCEDURE TODAY AT [facility]:   Refer to the procedure report that was given to you for any specific questions about what was found during the examination.  If the procedure report does not answer your questions, please call your gastroenterologist to clarify.  If you requested that your care partner not be given the details of your procedure findings, then the procedure report has been included in a sealed envelope for you to review at your convenience later.  YOU SHOULD EXPECT: Some feelings of bloating in the abdomen. Passage of more gas than usual.  Walking can help get rid of the air that was put into your GI tract during the procedure and reduce the bloating.   Please Note:  You might notice some irritation and congestion in your nose or some drainage.  This is from the oxygen used during your procedure.  There is no need for concern and it should clear up in a day or so.  SYMPTOMS TO REPORT IMMEDIATELY:    Following upper endoscopy (EGD)  Vomiting of blood or coffee ground material  New chest pain or pain under the shoulder blades  Painful or persistently difficult swallowing  New shortness of breath  Fever of 100F or higher  Black, tarry-looking stools  For urgent or emergent issues, a gastroenterologist can be reached at any hour by calling [phone]. Do not use MyChart messaging for urgent concerns.    DIET:  We do recommend nothing by mouth until 11 am.  From 11 am until 12 noon, you may have a clear liquid diet.  You may have a soft diet for the rest of today, and a regular diet tomorrow..  Drink plenty of fluids but you should avoid alcoholic beverages for 24 hours.  ACTIVITY:  You should plan to take it easy for the rest of today and you should NOT DRIVE or use heavy machinery until tomorrow (because of  the sedation medicines used during the test).    FOLLOW UP: Our staff will call the number listed on your records 48-72 hours following your procedure to check on you and address any questions or concerns that you may have regarding the information given to you following your procedure. If we do not reach you, we will leave a message.  We will attempt to reach you two times.  During this call, we will ask if you have developed any symptoms of COVID 19. If you develop any symptoms (ie: fever, flu-like symptoms, shortness of breath, cough etc.) before then, please call [phone].  If you test positive for Covid 19 in the 2 weeks post procedure, please call and report this information to [place].    If any biopsies were taken you will be contacted by phone or by letter within the next 1-3 weeks.  Please call us at [phone] if you have not heard about the biopsies in 3 weeks.    SIGNATURES/CONFIDENTIALITY: You and/or your care partner have signed paperwork which will be entered into your electronic medical record.  These signatures attest to the fact that that the information above on your After Visit Summary has been reviewed and is understood.  Full responsibility of the confidentiality of this discharge information lies with you and/or your care-partner.

## 2020-03-02 NOTE — Progress Notes (Signed)
Called to room to assist during endoscopic procedure.  Patient ID and intended procedure confirmed with present staff. Received instructions for my participation in the procedure from the performing physician.  

## 2020-03-02 NOTE — Progress Notes (Signed)
LC - Checked pt in [place]

## 2020-03-02 NOTE — Progress Notes (Signed)
Report to PACU, RN, vss, BBS= Clear.  

## 2020-03-04 ENCOUNTER — Telehealth: Payer: Self-pay

## 2020-03-04 ENCOUNTER — Other Ambulatory Visit: Payer: Self-pay | Admitting: Family Medicine

## 2020-03-04 DIAGNOSIS — S46911A Strain of unspecified muscle, fascia and tendon at shoulder and upper arm level, right arm, initial encounter: Secondary | ICD-10-CM

## 2020-03-04 NOTE — Telephone Encounter (Signed)
  Follow up Call-  Call back number 03/02/2020  Post procedure Call Back phone  # [phone] cell  Permission to leave phone message Yes  Some recent data might be hidden     Patient questions:  Do you have a fever, pain , or abdominal swelling? No. Pain Score  0 *  Have you tolerated food without any problems? Yes.    Have you been able to return to your normal activities? Yes.    Do you have any questions about your discharge instructions: Diet   No. Medications  No. Follow up visit  No.  Do you have questions or concerns about your Care? No.  Actions: * If pain score is 4 or above: No action needed, pain <4.  1. Have you developed a fever since your procedure? no  2.   Have you had an respiratory symptoms (SOB or cough) since your procedure? no  3.   Have you tested positive for COVID 19 since your procedure?  no  4.   Have you had any family members/close contacts diagnosed with the COVID 19 since your procedure?  no   If yes to any of these questions please route to Joylene John, RN and Erenest Rasher, RN

## 2020-03-09 ENCOUNTER — Encounter: Payer: Self-pay | Admitting: Gastroenterology

## 2020-04-02 ENCOUNTER — Ambulatory Visit: Payer: 59 | Admitting: Medical

## 2020-04-02 ENCOUNTER — Other Ambulatory Visit: Payer: Self-pay

## 2020-04-02 ENCOUNTER — Ambulatory Visit (HOSPITAL_BASED_OUTPATIENT_CLINIC_OR_DEPARTMENT_OTHER)
Admission: RE | Admit: 2020-04-02 | Discharge: 2020-04-02 | Disposition: A | Discharge status: Home / Self Care | Payer: 59 | Source: Ambulatory Visit | Attending: Medical | Admitting: Medical

## 2020-04-02 VITALS — BP 121/75 | HR 85 | Resp 18 | Ht 73.0 in | Wt 230.8 lb

## 2020-04-02 DIAGNOSIS — M25562 Pain in left knee: Secondary | ICD-10-CM | POA: Insufficient documentation

## 2020-04-02 MED ORDER — DICLOFENAC SODIUM 75 MG PO TBEC
75.0000 mg | DELAYED_RELEASE_TABLET | Freq: Two times a day (BID) | ORAL | 0 refills | Status: DC
Start: 2020-04-02 — End: 2020-04-27

## 2020-04-02 NOTE — Progress Notes (Signed)
Subjective:    Patient ID: Bobby Hobbs, male    DOB: 1981/09/12, 39 y.o.   MRN: [id number]  HPI   Pt in with on and off knee pain since the weekend.   His knee pop when he extends knee. Knee never locks. Walks at times and feels like knee sometimes feels weak randomly. He used compression brace over the counter and it seemed to help. Used only for 3 days.  Pain now almost 5 days.  No injury or fall.   No heavy work outs.  He states remote knee injury 20 years ago. He speculates patella almost  may have dislocated.    Review of Systems  Constitutional: Negative for chills, fatigue and fever.  Respiratory: Negative for cough, chest tightness, shortness of breath and wheezing.   Cardiovascular: Negative for chest pain and palpitations.  Musculoskeletal:       Left knee pain- pt has minimal pain medial aspect over tibial plateau region on palpation. No swelling. No crepitus. When he flexed before exam did her slight pop when he extended.    Past Medical History:  Diagnosis Date  . Allergy   . Asthma   . Diarrhea 09/06/2019  . GERD (gastroesophageal reflux disease)   . Irregular heart beat    Per pt very now and then pt said he has an extra heart beat  . Vomiting 09/06/2019     Social History   Socioeconomic History  . Marital status: Married    Spouse name: Janett Billow  . Number of children: 0  . Years of education: Not on file  . Highest education level: Not on file  Occupational History  . Occupation: Production assistant, radio  Tobacco Use  . Smoking status: Never Smoker  . Smokeless tobacco: Never Used  Vaping Use  . Vaping Use: Never used  Substance and Sexual Activity  . Alcohol use: Yes    Alcohol/week: 0.0 standard drinks    Comment: once a year  . Drug use: No  . Sexual activity: Yes  Other Topics Concern  . Not on file  Social History Narrative  . Not on file   Social Determinants of Health   Financial Resource Strain:   . Difficulty of  Paying Living Expenses:   Food Insecurity:   . Worried About Charity fundraiser in the Last Year:   . Arboriculturist in the Last Year:   Transportation Needs:   . Film/video editor (Medical):   Marland Kitchen Lack of Transportation (Non-Medical):   Physical Activity:   . Days of Exercise per Week:   . Minutes of Exercise per Session:   Stress:   . Feeling of Stress :   Social Connections:   . Frequency of Communication with Friends and Family:   . Frequency of Social Gatherings with Friends and Family:   . Attends Religious Services:   . Active Member of Clubs or Organizations:   . Attends Archivist Meetings:   Marland Kitchen Marital Status:   Intimate Partner Violence:   . Fear of Current or Ex-Partner:   . Emotionally Abused:   Marland Kitchen Physically Abused:   . Sexually Abused:     Past Surgical History:  Procedure Laterality Date  . LAPAROSCOPIC APPENDECTOMY N/A 12/28/2014   Procedure: APPENDECTOMY LAPAROSCOPIC;  Surgeon: Armandina Gemma, MD;  Location: [facility];  Service: General;  Laterality: N/A;    Family History  Problem Relation Age of Onset  . Hypertension Mother   . Achalasia Mother   .  Diabetes Father   . COPD Father   . Diabetes Maternal Grandmother   . Colon cancer Neg Hx   . Esophageal cancer Neg Hx   . Rectal cancer Neg Hx   . Stomach cancer Neg Hx     Allergies  Allergen Reactions  . Montelukast Other (See Comments)    headache  . Dairy Aid [Lactase]   . Hycodan [Hydrocodone-Homatropine] Nausea And Vomiting    Tolerates hydrocodone with acetaminophen    Current Outpatient Medications on File Prior to Visit  Medication Sig Dispense Refill  . albuterol (VENTOLIN HFA) 108 (90 Base) MCG/ACT inhaler Use 2 inhalations every 4-6 hours as needed for cough and wheezing. 8 g 1  . budesonide-formoterol (SYMBICORT) 80-4.5 MCG/ACT inhaler Inhale 2 puffs into the lungs 2 (two) times daily. 1 Inhaler 3  . cyclobenzaprine (FLEXERIL) 10 MG tablet Take 1 tablet (10 mg total) by mouth  2 (two) times daily as needed for muscle spasms. (Patient not taking: Reported on 04/02/2020) 30 tablet 0  . fluticasone (FLONASE) 50 MCG/ACT nasal spray Place 2 sprays into both nostrils daily. 16 g 6  . loratadine (CLARITIN) 10 MG tablet Take 10 mg by mouth daily.    . meloxicam (MOBIC) 15 MG tablet Take 1 tablet (15 mg total) by mouth daily. Use as needed for shoulder pain (Patient not taking: Reported on 04/02/2020) 30 tablet 0  . pantoprazole (PROTONIX) 40 MG tablet Take 1 tablet (40 mg total) by mouth daily. 30 tablet 11   Current Facility-Administered Medications on File Prior to Visit  Medication Dose Route Frequency Provider Last Rate Last Admin  . 0.9 %  sodium chloride infusion  500 mL Intravenous Once Jackquline Denmark, MD        BP 121/75 (BP Location: Left Arm, Patient Position: Sitting, Cuff Size: Large)   Pulse 85   Resp 18   Ht 6\' 1"  (1.854 m)   Wt 230 lb 12.8 oz (104.7 kg)   SpO2 98%   BMI 30.45 kg/m       Objective:   Physical Exam  General- No acute distress. Pleasant patient. Neck- Full range of motion, no jvd Lungs- Clear, even and unlabored. Heart- regular rate and rhythm. Neurologic- CNII- XII grossly intact.  Left knee- pain medial aspect over tibial plateau area. No crepitus. No swelling and no instability. When extends before exam heard slight pop/crepitus type sound.     Assessment & Plan:  For recent knee pain, will get xray today. Will rx diclofenac for pain. Do recommend using knee brace over next week.  Will let you know results of xray when in.  Update me by one week as to how you are doing. With you line of work if pain persists would refer to sports medicine.   Mackie Pai, PA-C   Time spent with patient today 80minutes which consisted of chart review, discussing diagnosis, work up, treatment and documentation.

## 2020-04-02 NOTE — Patient Instructions (Addendum)
For recent knee pain, will get xray today. Will rx diclofenac for pain. Do recommend using knee brace over next week.  Will let you know results of xray when in.  Update me by one week as to how you are doing. With you line of work if pain persists would refer to sports medicine or [facility] orthopedist.

## 2020-04-10 ENCOUNTER — Telehealth: Payer: Self-pay | Admitting: Medical

## 2020-04-10 ENCOUNTER — Encounter: Payer: Self-pay | Admitting: Medical

## 2020-04-10 DIAGNOSIS — M25562 Pain in left knee: Secondary | ICD-10-CM

## 2020-04-10 NOTE — Telephone Encounter (Signed)
Refer to sports med 

## 2020-04-17 ENCOUNTER — Ambulatory Visit: Payer: 59 | Admitting: Family Medicine

## 2020-04-17 ENCOUNTER — Ambulatory Visit: Payer: Self-pay

## 2020-04-17 ENCOUNTER — Other Ambulatory Visit: Payer: Self-pay

## 2020-04-17 ENCOUNTER — Encounter: Payer: Self-pay | Admitting: Family Medicine

## 2020-04-17 VITALS — BP 117/79 | HR 76 | Ht 73.0 in | Wt 231.0 lb

## 2020-04-17 DIAGNOSIS — M23304 Other meniscus derangements, unspecified medial meniscus, left knee: Secondary | ICD-10-CM

## 2020-04-17 DIAGNOSIS — M25562 Pain in left knee: Secondary | ICD-10-CM

## 2020-04-17 NOTE — Progress Notes (Signed)
[place] - 39 y.o. male MRN [id number]  Date of birth: December 04, 1980  SUBJECTIVE:  Including CC & ROS.  Chief Complaint  Patient presents with  . Knee Pain    left x 2.5-3 weeks    [place] is a 39 y.o. male that is presenting with left knee pain and giving way.  The symptoms are ongoing for a few weeks now.  Denies any specific injury.  He feels that medially sometimes laterally.  Denies history of surgery.  The leg giving way is intermittent in nature.  No locking of the knee.  No effusion..  Independent review left knee x-ray from 6/24 shows no acute normality.   Review of Systems See HPI   HISTORY: Past Medical, Surgical, Social, and Family History Reviewed & Updated per EMR.   Pertinent Historical Findings include:  Past Medical History:  Diagnosis Date  . Allergy   . Asthma   . Diarrhea 09/06/2019  . GERD (gastroesophageal reflux disease)   . Irregular heart beat    Per pt very now and then pt said he has an extra heart beat  . Vomiting 09/06/2019    Past Surgical History:  Procedure Laterality Date  . LAPAROSCOPIC APPENDECTOMY N/A 12/28/2014   Procedure: APPENDECTOMY LAPAROSCOPIC;  Surgeon: Armandina Gemma, MD;  Location: [facility];  Service: General;  Laterality: N/A;    Family History  Problem Relation Age of Onset  . Hypertension Mother   . Achalasia Mother   . Diabetes Father   . COPD Father   . Diabetes Maternal Grandmother   . Colon cancer Neg Hx   . Esophageal cancer Neg Hx   . Rectal cancer Neg Hx   . Stomach cancer Neg Hx     Social History   Socioeconomic History  . Marital status: Married    Spouse name: Janett Billow  . Number of children: 0  . Years of education: Not on file  . Highest education level: Not on file  Occupational History  . Occupation: Production assistant, radio  Tobacco Use  . Smoking status: Never Smoker  . Smokeless tobacco: Never Used  Vaping Use  . Vaping Use: Never used  Substance and Sexual Activity  .  Alcohol use: Yes    Alcohol/week: 0.0 standard drinks    Comment: once a year  . Drug use: No  . Sexual activity: Yes  Other Topics Concern  . Not on file  Social History Narrative  . Not on file   Social Determinants of Health   Financial Resource Strain:   . Difficulty of Paying Living Expenses:   Food Insecurity:   . Worried About Charity fundraiser in the Last Year:   . Arboriculturist in the Last Year:   Transportation Needs:   . Film/video editor (Medical):   Marland Kitchen Lack of Transportation (Non-Medical):   Physical Activity:   . Days of Exercise per Week:   . Minutes of Exercise per Session:   Stress:   . Feeling of Stress :   Social Connections:   . Frequency of Communication with Friends and Family:   . Frequency of Social Gatherings with Friends and Family:   . Attends Religious Services:   . Active Member of Clubs or Organizations:   . Attends Archivist Meetings:   Marland Kitchen Marital Status:   Intimate Partner Violence:   . Fear of Current or Ex-Partner:   . Emotionally Abused:   Marland Kitchen Physically Abused:   . Sexually  Abused:      PHYSICAL EXAM:  VS: BP 117/79   Pulse 76   Ht 6\' 1"  (1.854 m)   Wt 231 lb (104.8 kg)   BMI 30.48 kg/m  Physical Exam Gen: NAD, alert, cooperative with exam, well-appearing MSK:  Left knee: Normal range of motion. No effusion. No tenderness palpation of the medial lateral joint space. No instability with valgus and varus stress testing. Some irritation with Thessaly test. Neurovascularly intact  Limited ultrasound: Left knee:  No effusion in the suprapatellar pouch. Chronic changes at the insertion of the patellar tendon related to [facility]. Normal medial joint space with outpouching of the medial meniscus. Normal-appearing lateral compartment.   Summary: Findings suggest degenerative change of the medial meniscus.  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  Has degenerative change of the medial meniscus which is likely contributing to his intermittent type symptoms.  No tearing appreciated with no significant effusion or findings on clinical exam. -Counseled on home exercise therapy and supportive care. -We will continue the brace on gaining strength. -Could consider physical therapy or injection.

## 2020-04-17 NOTE — Assessment & Plan Note (Signed)
Pain is mild and buckling is intermittent.  Has degenerative change of the medial meniscus which is likely contributing to his intermittent type symptoms.  No tearing appreciated with no significant effusion or findings on clinical exam. -Counseled on home exercise therapy and supportive care. -We will continue the brace on gaining strength. -Could consider physical therapy or injection.

## 2020-04-17 NOTE — Patient Instructions (Signed)
Nice to meet you Please try ice  Please continue the brace for the time being  Please try the exercises   Please send me a message in MyChart with any questions or updates.  Please see me back in 4 weeks.   --Dr. Raeford Razor

## 2020-04-26 ENCOUNTER — Encounter: Payer: Self-pay | Admitting: Family Medicine

## 2020-04-27 ENCOUNTER — Other Ambulatory Visit: Payer: Self-pay | Admitting: *Deleted

## 2020-04-27 ENCOUNTER — Encounter: Payer: Self-pay | Admitting: *Deleted

## 2020-04-27 MED ORDER — DICLOFENAC SODIUM 75 MG PO TBEC: 75.0000 mg | DELAYED_RELEASE_TABLET | Freq: Two times a day (BID) | ORAL | 0 refills | Status: DC

## 2020-05-14 ENCOUNTER — Other Ambulatory Visit: Payer: Self-pay | Admitting: Registered Nurse

## 2020-05-14 DIAGNOSIS — J4540 Moderate persistent asthma, uncomplicated: Secondary | ICD-10-CM

## 2020-05-15 ENCOUNTER — Other Ambulatory Visit: Payer: Self-pay

## 2020-05-15 ENCOUNTER — Encounter: Payer: Self-pay | Admitting: Family Medicine

## 2020-05-15 ENCOUNTER — Ambulatory Visit (INDEPENDENT_AMBULATORY_CARE_PROVIDER_SITE_OTHER): Payer: 59 | Admitting: Family Medicine

## 2020-05-15 DIAGNOSIS — M23304 Other meniscus derangements, unspecified medial meniscus, left knee: Secondary | ICD-10-CM

## 2020-05-15 NOTE — Progress Notes (Signed)
[place] - 39 y.o. male MRN [id number]  Date of birth: 06/27/1981  SUBJECTIVE:  Including CC & ROS.  Chief Complaint  Patient presents with  . Follow-up    left knee    Bobby Hobbs is a 39 y.o. male that is following up for his left knee pain.  He has been doing well with the exercises.  He denies any of the giving way as often as it was occurring previously.  Pain is evident after his shift.   Review of Systems See HPI   HISTORY: Past Medical, Surgical, Social, and Family History Reviewed & Updated per EMR.   Pertinent Historical Findings include:  Past Medical History:  Diagnosis Date  . Allergy   . Asthma   . Diarrhea 09/06/2019  . GERD (gastroesophageal reflux disease)   . Irregular heart beat    Per pt very now and then pt said he has an extra heart beat  . Vomiting 09/06/2019    Past Surgical History:  Procedure Laterality Date  . LAPAROSCOPIC APPENDECTOMY N/A 12/28/2014   Procedure: APPENDECTOMY LAPAROSCOPIC;  Surgeon: Armandina Gemma, MD;  Location: [facility];  Service: General;  Laterality: N/A;    Family History  Problem Relation Age of Onset  . Hypertension Mother   . Achalasia Mother   . Diabetes Father   . COPD Father   . Diabetes Maternal Grandmother   . Colon cancer Neg Hx   . Esophageal cancer Neg Hx   . Rectal cancer Neg Hx   . Stomach cancer Neg Hx     Social History   Socioeconomic History  . Marital status: Married    Spouse name: Janett Billow  . Number of children: 0  . Years of education: Not on file  . Highest education level: Not on file  Occupational History  . Occupation: Production assistant, radio  Tobacco Use  . Smoking status: Never Smoker  . Smokeless tobacco: Never Used  Vaping Use  . Vaping Use: Never used  Substance and Sexual Activity  . Alcohol use: Yes    Alcohol/week: 0.0 standard drinks    Comment: once a year  . Drug use: No  . Sexual activity: Yes  Other Topics Concern  . Not on file  Social History  Narrative  . Not on file   Social Determinants of Health   Financial Resource Strain:   . Difficulty of Paying Living Expenses:   Food Insecurity:   . Worried About Charity fundraiser in the Last Year:   . Arboriculturist in the Last Year:   Transportation Needs:   . Film/video editor (Medical):   Marland Kitchen Lack of Transportation (Non-Medical):   Physical Activity:   . Days of Exercise per Week:   . Minutes of Exercise per Session:   Stress:   . Feeling of Stress :   Social Connections:   . Frequency of Communication with Friends and Family:   . Frequency of Social Gatherings with Friends and Family:   . Attends Religious Services:   . Active Member of Clubs or Organizations:   . Attends Archivist Meetings:   Marland Kitchen Marital Status:   Intimate Partner Violence:   . Fear of Current or Ex-Partner:   . Emotionally Abused:   Marland Kitchen Physically Abused:   . Sexually Abused:      PHYSICAL EXAM:  VS: BP 124/85   Pulse 76   Ht 6\' 1"  (1.854 m)   Wt 230 lb (104.3 kg)  BMI 30.34 kg/m  Physical Exam Gen: NAD, alert, cooperative with exam, well-appearing MSK:  Left knee: No obvious effusion. Normal range of motion. Normal strength resistance. Neurovascular intact     ASSESSMENT & PLAN:   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 Doing well with the home exercises.  The giving way and pain have improved so far. -Counseled on home exercise therapy and supportive care. -Could consider physical therapy or injection.

## 2020-05-15 NOTE — Assessment & Plan Note (Signed)
Doing well with the home exercises.  The giving way and pain have improved so far. -Counseled on home exercise therapy and supportive care. -Could consider physical therapy or injection.

## 2020-05-15 NOTE — Patient Instructions (Signed)
Good to see you Please continue the exercises  Please try ice   Please send me a message in MyChart with any questions or updates.  Please see me back in 6-8 weeks .   --Dr. Raeford Razor

## 2020-05-19 ENCOUNTER — Encounter: Payer: Self-pay | Admitting: Family Medicine

## 2020-05-19 ENCOUNTER — Telehealth (INDEPENDENT_AMBULATORY_CARE_PROVIDER_SITE_OTHER): Payer: 59 | Admitting: Family Medicine

## 2020-05-19 DIAGNOSIS — J3489 Other specified disorders of nose and nasal sinuses: Secondary | ICD-10-CM | POA: Diagnosis not present

## 2020-05-19 DIAGNOSIS — R0981 Nasal congestion: Secondary | ICD-10-CM

## 2020-05-19 MED ORDER — DOXYCYCLINE HYCLATE 100 MG PO TABS
100.0000 mg | ORAL_TABLET | Freq: Two times a day (BID) | ORAL | 0 refills | Status: DC
Start: 2020-05-19 — End: 2020-06-03

## 2020-05-19 NOTE — Progress Notes (Signed)
Virtual Visit via Telephone Note  I connected with [place] on 05/19/20 at 10:40 AM EDT by telephone and verified that I am speaking with the correct person using two identifiers.   I discussed the limitations, risks, security and privacy concerns of performing an evaluation and management service by telephone and the availability of in person appointments. I also discussed with the patient that there may be a patient responsible charge related to this service. The patient expressed understanding and agreed to proceed.  Location patient: home, [place] Location provider: work or home office Participants present for the call: patient, provider Patient did not have a visit in the prior 7 days to address this/these issue(s).   History of Present Illness:    Acute visit for sinus issues: -started about 1 week ago -symptoms include sore throat, nasal congestion, cough, sinus pressure -feels like is worse around the maxillary sinus area -denies fevers, vomiting, body aches, SOB -reports a history of sinus infections and thinks this is the same, reports saw ENT in the past and they advised sinus surgery, but he declined -last time he took abx was about a year ago -does have a history of allergies and takes claritin and flonase -has asthma, used his rescue inhaler - a few times last week and improved -not vaccinated for COVID19  -no known sick contacts -had a negative covid test on the 4th day - rapid and PCR, both negative -denies abx allergies, reports did well with doxy in the past -requests work note   Observations/Objective: Patient sounds cheerful and well on the phone. I do not appreciate any SOB. Speech and thought processing are grossly intact. Patient reported vitals:  Assessment and Plan:  Sinus congestion  Sinus pain  -we discussed possible serious and likely etiologies, options for evaluation and workup, limitations of telemedicine visit vs in person visit,  treatment, treatment risks and precautions. Pt prefers to treat via telemedicine empirically rather then risking or undertaking an in person visit at this moment. Query developing sinusitis 2ndary to VURI vs other. Opted for treatment with nasal saline and short course of nasal decongestant for 3 days with addition of doxy 100mg  bid x 10 days if worsening or not improving. Work note provided. Counseled on the COVID19 vaccine. Patient agrees to seek prompt in person care if worsening, new symptoms arise, or if is not improving with treatment.  Follow Up Instructions:   I did not refer this patient for an OV in the next 24 hours for this/these issue(s).  I discussed the assessment and treatment plan with the patient. The patient was provided an opportunity to ask questions and all were answered. The patient agreed with the plan and demonstrated an understanding of the instructions.   The patient was advised to call back or seek an in-person evaluation if the symptoms worsen or if the condition fails to improve as anticipated.  I provided 21 minutes of non-face-to-face time during this encounter.   Lucretia Kern, DO

## 2020-05-19 NOTE — Patient Instructions (Addendum)
-  I sent the medication(s) we discussed to your pharmacy: Meds ordered this encounter  Medications  . doxycycline (VIBRA-TABS) 100 MG tablet    Sig: Take 1 tablet (100 mg total) by mouth 2 (two) times daily.    Dispense:  20 tablet    Refill:  0    Nasal saline twice daily and short 3 day course of Afrin nasal decongestant.  Can star the antibiotic (doxycycline) if sinus issues worsen or are not improving over the next few days.  Please let us know if you have any questions or concerns regarding this prescription.  I hope you are feeling better soon! Seek care promptly if your symptoms worsen, new concerns arise or you are not improving with treatment.   WORK SLIP:  Patient Bobby Hobbs,  [birth year], was seen for a medical visit today, 05/19/2020. Please excuse from work today and according to the CDC guidelines for a COVID like illness. We advise 10 days minimum from the onset of symptoms (05/12/2020) PLUS 1 day of no fever and improved symptoms. Will defer to employer for a sooner return to work if [place] testing is negative and the symptoms have resolved. Advise following CDC guidelines.    Sincerely: E-signature: Dr. Colin Benton, DO [place] Ph: [phone]

## 2020-05-20 ENCOUNTER — Encounter: Payer: Self-pay | Admitting: Family Medicine

## 2020-06-03 ENCOUNTER — Ambulatory Visit: Payer: 59 | Admitting: Gastroenterology

## 2020-06-03 ENCOUNTER — Encounter: Payer: Self-pay | Admitting: Gastroenterology

## 2020-06-03 VITALS — BP 120/76 | HR 86 | Ht 73.0 in | Wt 233.5 lb

## 2020-06-03 DIAGNOSIS — K219 Gastro-esophageal reflux disease without esophagitis: Secondary | ICD-10-CM

## 2020-06-03 NOTE — Patient Instructions (Signed)
If you are age 39 or older, your body mass index should be between 23-30. Your Body mass index is 30.81 kg/m. If this is out of the aforementioned range listed, please consider follow up with your Primary Care Provider.  If you are age 68 or younger, your body mass index should be between 19-25. Your Body mass index is 30.81 kg/m. If this is out of the aformentioned range listed, please consider follow up with your Primary Care Provider.   You will be due for a recall colonoscopy in 02/2023. We will send you a reminder in the mail when it gets closer to that time.   Thank you,  Dr. Jackquline Denmark

## 2020-06-03 NOTE — Progress Notes (Signed)
Chief Complaint: dysphagia  Referring Provider:  Mackie Pai, PA-C      ASSESSMENT AND PLAN;    #1. Eso dysphagia d/t Schatzki's ring s/p dil 52Fr 02/2020 (resolved)  #2. GERD with small HH  #3. Incidental small duodenal TA  Plan: -Protonix 40 mg p.o. QD, 1/2 hr before breakfast. #90.  4 refills. -Recall EGD 02/2023 -Screening colon at age 39 -Call us in case of any GI problems.  HPI:    Bobby Hobbs is a 39 y.o. male   For follow-up visit Had barium swallow followed by EGD with dilatation to 52Fr with good results. No further dysphagia except one episode when he was trying to eat fast. Tolerating Protonix well. He could tell when he misses even a single dose.  Has been walking 16,000 steps per day.  Watching what he eats.  Not much of fried foods.  He works night shift at the prison.  No diarrhea or constipation.  Pleased with the progress.  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 Past Medical History:  Diagnosis Date  . Allergy   . Asthma   . Diarrhea 09/06/2019  . GERD (gastroesophageal reflux disease)   . Irregular heart beat    Per pt very now and then pt said he has an extra heart beat  . Vomiting 09/06/2019    Past Surgical History:  Procedure Laterality Date  . LAPAROSCOPIC APPENDECTOMY N/A 12/28/2014   Procedure: APPENDECTOMY LAPAROSCOPIC;  Surgeon: Armandina Gemma, MD;  Location: [facility];  Service: General;  Laterality: N/A;    Family History  Problem Relation Age of Onset  . Hypertension Mother   . Achalasia Mother   . Diabetes Father   . COPD Father   . Diabetes Maternal Grandmother   . Colon cancer Neg Hx   . Esophageal cancer Neg Hx   . Rectal cancer Neg Hx   . Stomach cancer Neg Hx     Social History   Tobacco Use  . Smoking status: Never Smoker  . Smokeless tobacco: Never Used  Vaping Use   . Vaping Use: Never used  Substance Use Topics  . Alcohol use: Yes    Alcohol/week: 0.0 standard drinks    Comment: once a year  . Drug use: No    Current Outpatient Medications  Medication Sig Dispense Refill  . albuterol (VENTOLIN HFA) 108 (90 Base) MCG/ACT inhaler Use 2 inhalations every 4-6 hours as needed for cough and wheezing. 8 g 1  . budesonide-formoterol (SYMBICORT) 80-4.5 MCG/ACT inhaler Inhale 2 puffs into the lungs 2 (two) times daily. 1 Inhaler 3  . fluticasone (FLONASE) 50 MCG/ACT nasal spray Place 2 sprays into both nostrils daily. 16 g 6  . loratadine (CLARITIN) 10 MG tablet Take 10 mg by mouth daily.    . pantoprazole (PROTONIX) 40 MG tablet Take 1 tablet (40 mg total) by mouth daily. 30 tablet 11   Current Facility-Administered Medications  Medication Dose Route Frequency Provider Last Rate Last Admin  . 0.9 %  sodium chloride infusion  500 mL Intravenous Once Jackquline Denmark, MD        Allergies  Allergen Reactions  . Montelukast Other (See Comments)    headache  . Dairy Aid [Lactase]   . Hycodan [Hydrocodone-Homatropine] Nausea And Vomiting    Tolerates hydrocodone with acetaminophen  Review of Systems:  neg     Physical Exam:    BP 120/76   Pulse 86   Ht 6\' 1"  (1.854 m)   Wt 233 lb 8 oz (105.9 kg)   BMI 30.81 kg/m  Wt Readings from Last 3 Encounters:  06/03/20 233 lb 8 oz (105.9 kg)  05/15/20 230 lb (104.3 kg)  04/17/20 231 lb (104.8 kg)   Constitutional:  Well-developed, in no acute distress. Psychiatric: Normal mood and affect. Behavior is normal. Abdominal: Soft, nondistended. Nontender. Bowel sounds active throughout. There are no masses palpable. No hepatomegaly. Rectal:  defered Neurological: Alert and oriented to person place and time. Skin: Skin is warm and dry. No rashes noted.  Data Reviewed: I have personally reviewed following labs and imaging studies  CBC: CBC Latest Ref Rng & Units 12/16/2016 12/31/2015 12/30/2014  WBC 4.6 -  10.2 K/uL 11.0(A) 7.4 9.6  Hemoglobin 14.1 - 18.1 g/dL 17.0 16.0 13.1  Hematocrit 43.5 - 53.7 % 49.4 45.7 39.7  Platelets 150 - 400 K/uL - - 202    CMP: CMP Latest Ref Rng & Units 12/16/2016 12/31/2014 12/30/2014  Glucose 65 - 99 mg/dL 86 117(H) 158(H)  BUN 6 - 20 mg/dL 9 16 13   Creatinine 0.76 - 1.27 mg/dL 1.07 1.00 0.83  Sodium 134 - 144 mmol/L 140 138 135  Potassium 3.5 - 5.2 mmol/L 3.9 3.4(L) 3.1(L)  Chloride 96 - 106 mmol/L 97 101 98  CO2 18 - 29 mmol/L 24 29 26   Calcium 8.7 - 10.2 mg/dL 9.4 8.0(L) 8.1(L)  Total Protein 6.0 - 8.3 g/dL - - -  Total Bilirubin 0.3 - 1.2 mg/dL - - -  Alkaline Phos 39 - 117 U/L - - -  AST 0 - 37 U/L - - -  ALT 0 - 53 U/L - - -      Carmell Austria, MD 06/03/2020, 9:46 AM  Cc: Mackie Pai, PA-C

## 2020-07-10 ENCOUNTER — Other Ambulatory Visit: Payer: Self-pay

## 2020-07-10 ENCOUNTER — Encounter: Payer: Self-pay | Admitting: Family Medicine

## 2020-07-10 ENCOUNTER — Ambulatory Visit (INDEPENDENT_AMBULATORY_CARE_PROVIDER_SITE_OTHER): Payer: 59 | Admitting: Family Medicine

## 2020-07-10 DIAGNOSIS — J209 Acute bronchitis, unspecified: Secondary | ICD-10-CM

## 2020-07-10 DIAGNOSIS — M23304 Other meniscus derangements, unspecified medial meniscus, left knee: Secondary | ICD-10-CM

## 2020-07-10 DIAGNOSIS — J4540 Moderate persistent asthma, uncomplicated: Secondary | ICD-10-CM

## 2020-07-10 MED ORDER — BUDESONIDE-FORMOTEROL FUMARATE 80-4.5 MCG/ACT IN AERO: 2.0000 | INHALATION_SPRAY | Freq: Two times a day (BID) | RESPIRATORY_TRACT | 5 refills | Status: DC

## 2020-07-10 MED ORDER — ALBUTEROL SULFATE HFA 108 (90 BASE) MCG/ACT IN AERS: INHALATION_SPRAY | RESPIRATORY_TRACT | 5 refills | Status: DC

## 2020-07-10 NOTE — Progress Notes (Signed)
[place] - 39 y.o. male MRN [id number]  Date of birth: [birth year]  SUBJECTIVE:  Including CC & ROS.  Chief Complaint  Patient presents with  . Follow-up    left knee    Bobby Hobbs is a 39 y.o. male that is following up for his knee pain.  He has been doing well overall.  Denies any giving way or mechanical symptoms.   Review of Systems See HPI   HISTORY: Past Medical, Surgical, Social, and Family History Reviewed & Updated per EMR.   Pertinent Historical Findings include:  Past Medical History:  Diagnosis Date  . Allergy   . Asthma   . Diarrhea 09/06/2019  . GERD (gastroesophageal reflux disease)   . Irregular heart beat    Per pt very now and then pt said he has an extra heart beat  . Vomiting 09/06/2019    Past Surgical History:  Procedure Laterality Date  . LAPAROSCOPIC APPENDECTOMY N/A 12/28/2014   Procedure: APPENDECTOMY LAPAROSCOPIC;  Surgeon: Armandina Gemma, MD;  Location: [facility];  Service: General;  Laterality: N/A;    Family History  Problem Relation Age of Onset  . Hypertension Mother   . Achalasia Mother   . Diabetes Father   . COPD Father   . Diabetes Maternal Grandmother   . Colon cancer Neg Hx   . Esophageal cancer Neg Hx   . Rectal cancer Neg Hx   . Stomach cancer Neg Hx     Social History   Socioeconomic History  . Marital status: Married    Spouse name: Janett Billow  . Number of children: 0  . Years of education: Not on file  . Highest education level: Not on file  Occupational History  . Occupation: Production assistant, radio  Tobacco Use  . Smoking status: Never Smoker  . Smokeless tobacco: Never Used  Vaping Use  . Vaping Use: Never used  Substance and Sexual Activity  . Alcohol use: Yes    Alcohol/week: 0.0 standard drinks    Comment: once a year  . Drug use: No  . Sexual activity: Yes  Other Topics Concern  . Not on file  Social History Narrative  . Not on file   Social Determinants of Health   Financial  Resource Strain:   . Difficulty of Paying Living Expenses: Not on file  Food Insecurity:   . Worried About Charity fundraiser in the Last Year: Not on file  . Ran Out of Food in the Last Year: Not on file  Transportation Needs:   . Lack of Transportation (Medical): Not on file  . Lack of Transportation (Non-Medical): Not on file  Physical Activity:   . Days of Exercise per Week: Not on file  . Minutes of Exercise per Session: Not on file  Stress:   . Feeling of Stress : Not on file  Social Connections:   . Frequency of Communication with Friends and Family: Not on file  . Frequency of Social Gatherings with Friends and Family: Not on file  . Attends Religious Services: Not on file  . Active Member of Clubs or Organizations: Not on file  . Attends Archivist Meetings: Not on file  . Marital Status: Not on file  Intimate Partner Violence:   . Fear of Current or Ex-Partner: Not on file  . Emotionally Abused: Not on file  . Physically Abused: Not on file  . 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  Only has occasional and intermittent symptoms. -Counseled on home exercise therapy and supportive care. -Provided work note for -Could consider physical therapy if necessary.

## 2020-07-10 NOTE — Assessment & Plan Note (Signed)
Continues to improve.  Only has occasional and intermittent symptoms. -Counseled on home exercise therapy and supportive care. -Provided work note for -Could consider physical therapy if necessary.

## 2020-07-10 NOTE — Patient Instructions (Signed)
[place] to see you Please continue the exercises   Please send me a message in MyChart with any questions or updates.  Please see me back in 8 weeks or as needed if better.   --Dr. Raeford Razor

## 2020-07-13 ENCOUNTER — Ambulatory Visit: Payer: 59 | Admitting: Family

## 2020-09-22 ENCOUNTER — Telehealth (INDEPENDENT_AMBULATORY_CARE_PROVIDER_SITE_OTHER): Payer: 59 | Admitting: Medical

## 2020-09-22 VITALS — HR 68 | Temp 101.2°F

## 2020-09-22 DIAGNOSIS — J019 Acute sinusitis, unspecified: Secondary | ICD-10-CM

## 2020-09-22 DIAGNOSIS — J0100 Acute maxillary sinusitis, unspecified: Secondary | ICD-10-CM | POA: Diagnosis not present

## 2020-09-22 MED ORDER — FLUTICASONE PROPIONATE 50 MCG/ACT NA SUSP
2.0000 | Freq: Every day | NASAL | 1 refills | Status: DC
Start: 2020-09-22 — End: 2021-02-16

## 2020-09-22 MED ORDER — AMOXICILLIN-POT CLAVULANATE 875-125 MG PO TABS: 1.0000 | ORAL_TABLET | Freq: Two times a day (BID) | ORAL | 0 refills | Status: DC

## 2020-09-22 NOTE — Progress Notes (Signed)
Subjective:    Patient ID: Bobby Hobbs, male    DOB: 23-May-1981, 39 y.o.   MRN: [id number]  HPI   Virtual Visit via Video Note  I connected with Bobby Hobbs on 09/22/20 at 11:00 AM EST by a video enabled telemedicine application and verified that I am speaking with the correct person using two identifiers.  Location: Patient: home Provider: office  Pt does not have bp cuff.    I discussed the limitations of evaluation and management by telemedicine and the availability of in person appointments. The patient expressed understanding and agreed to proceed.  History of Present Illness: Pt states he has been sick for 8-9 days. He states that he had mostly nasal congestion. He states usually does get this in the fall. He was sneezing, itching eyes and had post nasal drainage. Pt just recently got sinus pressure with fever 101.1 last night but no fever presently per pt.  Some rt side maxillary and frontal sinus pressure.  Pt had hx of yearly infection in the past.  Pt no body aches.   Pt has not had covid vaccine. He has to be tested on Friday per work. He had result on Sunday negative.   Observations/Objective: General-no acute distress, pleasant, oriented. Lungs- on inspection lungs appear unlabored. Neck- no tracheal deviation or jvd on inspection. Neuro- gross motor function appears intact.   Assessment and Plan:   Follow Up Instructions:    I discussed the assessment and treatment plan with the patient. The patient was provided an opportunity to ask questions and all were answered. The patient agreed with the plan and demonstrated an understanding of the instructions.   The patient was advised to call back or seek an in-person evaluation if the symptoms worsen or if the condition fails to improve as anticipated.  Time spent with patient today was 15 minutes which consisted of chart revdiew, discussing diagnosis, work up treatment and  documentation.   Mackie Pai, PA-C   Review of Systems  HENT: Positive for congestion, sinus pressure and sinus pain.   Respiratory: Positive for cough. Negative for chest tightness, shortness of breath and wheezing.        Rare cough/minimal per pt  Cardiovascular: Negative for chest pain and palpitations.  Gastrointestinal: Negative for abdominal pain.  Genitourinary: Negative for flank pain.  Musculoskeletal: Negative for back pain.  Skin: Negative for rash.  Neurological: Negative for dizziness, speech difficulty, weakness, numbness and headaches.  Hematological: Negative for adenopathy. Does not bruise/bleed easily.  Psychiatric/Behavioral: Negative for behavioral problems and confusion.    Past Medical History:  Diagnosis Date  . Allergy   . Asthma   . Diarrhea 09/06/2019  . GERD (gastroesophageal reflux disease)   . Irregular heart beat    Per pt very now and then pt said he has an extra heart beat  . Vomiting 09/06/2019     Social History   Socioeconomic History  . Marital status: Married    Spouse name: Bobby Hobbs  . Number of children: 0  . Years of education: Not on file  . Highest education level: Not on file  Occupational History  . Occupation: Production assistant, radio  Tobacco Use  . Smoking status: Never Smoker  . Smokeless tobacco: Never Used  Vaping Use  . Vaping Use: Never used  Substance and Sexual Activity  . Alcohol use: Yes    Alcohol/week: 0.0 standard drinks    Comment: once a year  . Drug use: No  .  Sexual activity: Yes  Other Topics Concern  . Not on file  Social History Narrative  . Not on file   Social Determinants of Health   Financial Resource Strain: Not on file  Food Insecurity: Not on file  Transportation Needs: Not on file  Physical Activity: Not on file  Stress: Not on file  Social Connections: Not on file  Intimate Partner Violence: Not on file    Past Surgical History:  Procedure Laterality Date  . LAPAROSCOPIC  APPENDECTOMY N/A 12/28/2014   Procedure: APPENDECTOMY LAPAROSCOPIC;  Surgeon: Armandina Gemma, MD;  Location: [facility];  Service: General;  Laterality: N/A;    Family History  Problem Relation Age of Onset  . Hypertension Mother   . Achalasia Mother   . Diabetes Father   . COPD Father   . Diabetes Maternal Grandmother   . Colon cancer Neg Hx   . Esophageal cancer Neg Hx   . Rectal cancer Neg Hx   . Stomach cancer Neg Hx     Allergies  Allergen Reactions  . Montelukast Other (See Comments)    headache  . Dairy Aid [Lactase]   . Hycodan [Hydrocodone-Homatropine] Nausea And Vomiting    Tolerates hydrocodone with acetaminophen    Current Outpatient Medications on File Prior to Visit  Medication Sig Dispense Refill  . pantoprazole (PROTONIX) 40 MG tablet Take 1 tablet (40 mg total) by mouth daily. 30 tablet 11  . albuterol (VENTOLIN HFA) 108 (90 Base) MCG/ACT inhaler Use 2 inhalations every 4-6 hours as needed for cough and wheezing. (Patient not taking: Reported on 09/22/2020) 8 g 5  . budesonide-formoterol (SYMBICORT) 80-4.5 MCG/ACT inhaler Inhale 2 puffs into the lungs 2 (two) times daily. (Patient not taking: Reported on 09/22/2020) 10.2 g 5  . fluticasone (FLONASE) 50 MCG/ACT nasal spray Place 2 sprays into both nostrils daily. (Patient not taking: Reported on 09/22/2020) 16 g 6   Current Facility-Administered Medications on File Prior to Visit  Medication Dose Route Frequency Provider Last Rate Last Admin  . 0.9 %  sodium chloride infusion  500 mL Intravenous Once Jackquline Denmark, MD        Pulse 68   Temp (!) 101.2 F (38.4 C) Comment: last night.     Objective:   Physical Exam  General-no acute distress, pleasant, oriented. Lungs- on inspection lungs appear unlabored. Neck- no tracheal deviation or jvd on inspection. Neuro- gross motor function appears intact. heent- rt side frontal and maxillary sinus pressure.        Assessment & Plan:  You appear to have probable  sinus infection. I am prescribing augmentin antibiotic for the infection. To help with the nasal congestion I prescribed  flonase nasal steroid. For your associated cough offered cough tablet. Declined presently but if cough worsens can send if needed.  Work has covid Advertising copywriter in place. Advised pt to inform them of current illness. Follow testing protocols for return to work.  Rest, hydrate, tylenol for fever.  Follow up in 7 days or as needed  Mackie Pai, PA-C   Time spent with patient today was 20  minutes which consisted of chart review, discussing diagnosis, work up, treatment and documentation.

## 2020-09-22 NOTE — Patient Instructions (Signed)
You appear to have probable sinus infection. I am prescribing augmentin antibiotic for the infection. To help with the nasal congestion I prescribed  flonase nasal steroid. For your associated cough offered cough tablet. Declined presently but if cough worsens can send if needed.  Work has covid Advertising copywriter in place. Advised pt to inform them of current illness. Follow testing protocols for return to work.  Rest, hydrate, tylenol for fever.  Follow up in 7 days or as needed

## 2020-10-20 ENCOUNTER — Telehealth (INDEPENDENT_AMBULATORY_CARE_PROVIDER_SITE_OTHER): Payer: 59 | Admitting: Medical

## 2020-10-20 ENCOUNTER — Encounter: Payer: Self-pay | Admitting: Medical

## 2020-10-20 VITALS — HR 70

## 2020-10-20 DIAGNOSIS — R112 Nausea with vomiting, unspecified: Secondary | ICD-10-CM

## 2020-10-20 DIAGNOSIS — R197 Diarrhea, unspecified: Secondary | ICD-10-CM

## 2020-10-20 IMAGING — DX DG CHEST 2V
2 series · 2 of 2 positions shown · non-contrast
Comparison: 12/12/2015

CLINICAL DATA: Wheezing

EXAM:
CHEST - 2 VIEW

[chest pa]
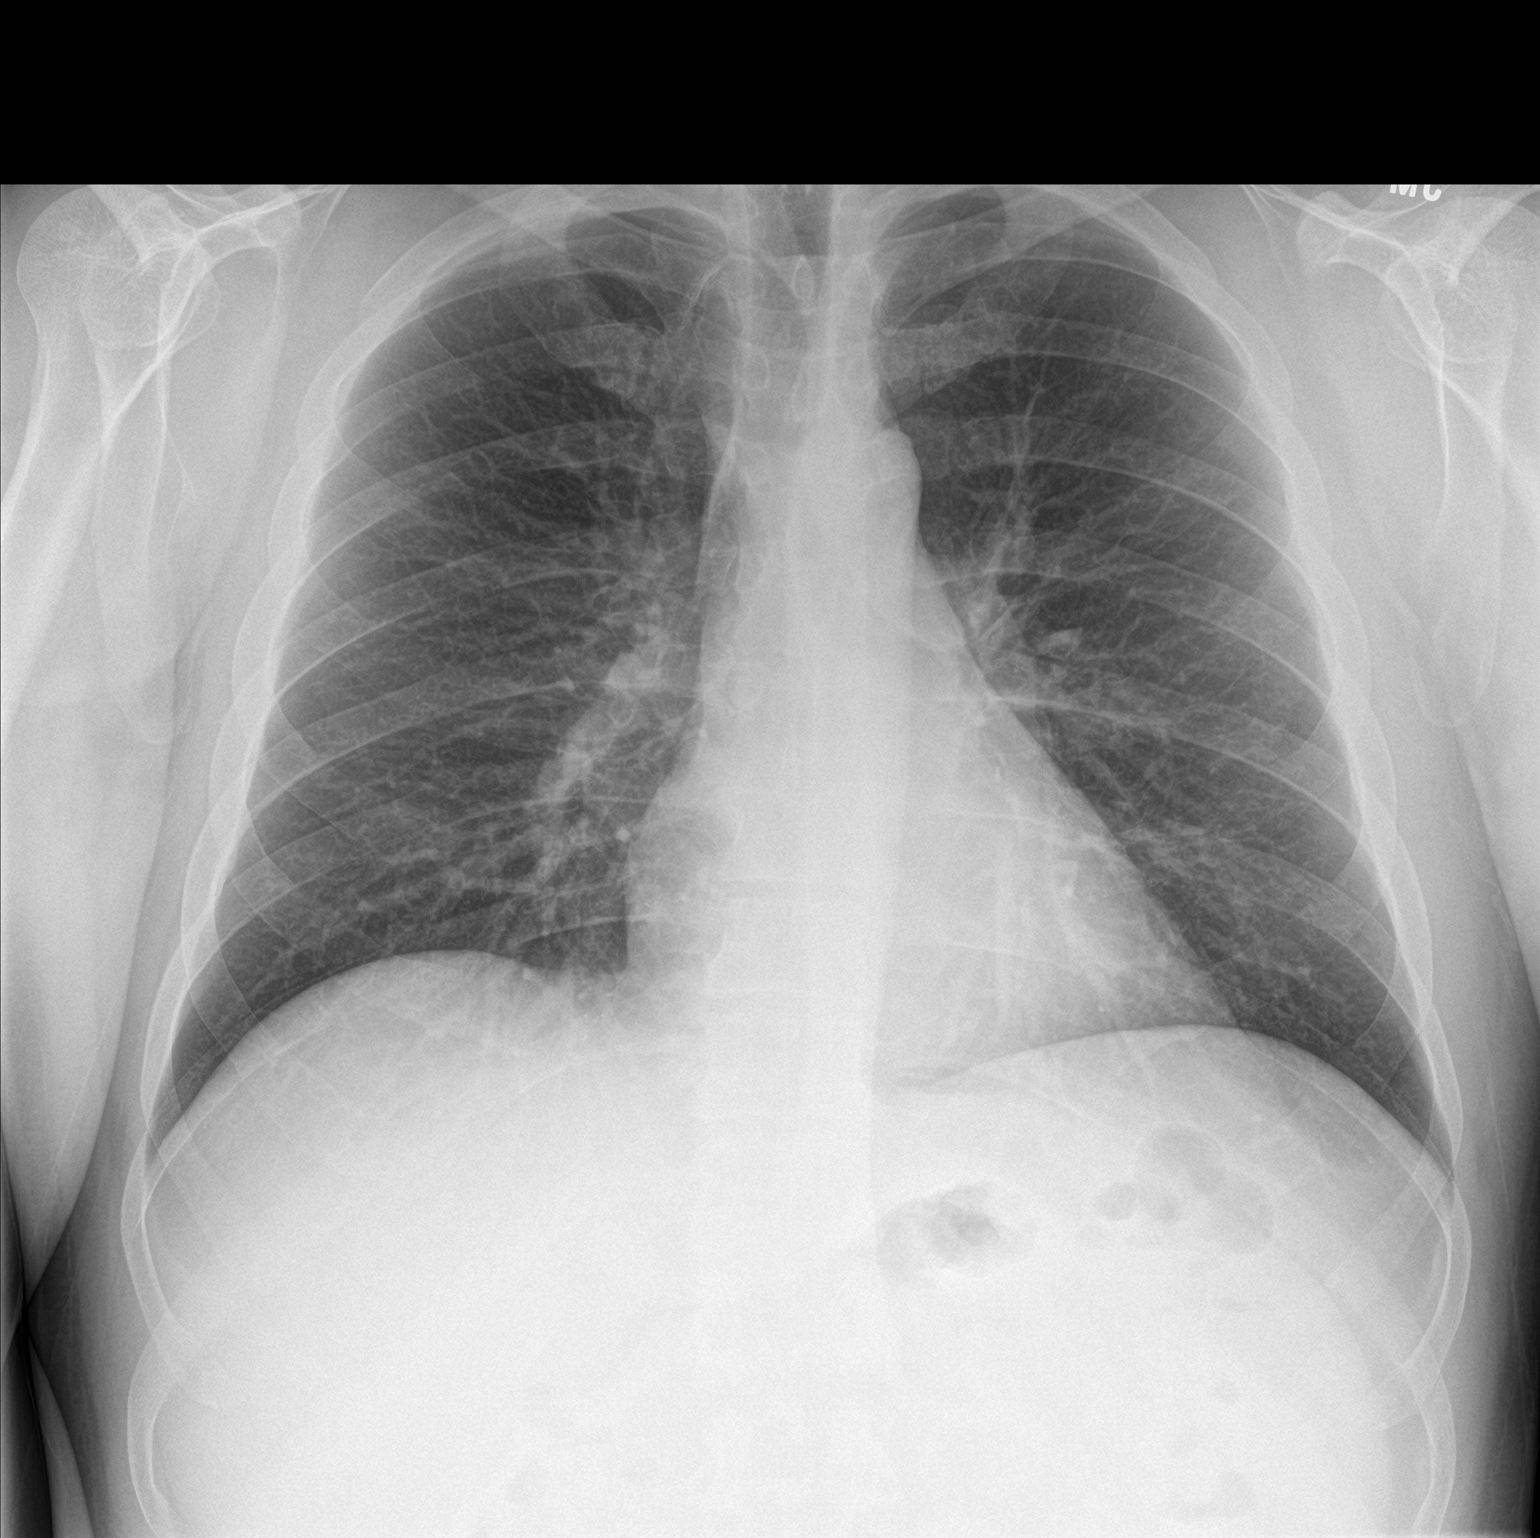

[chest lat]
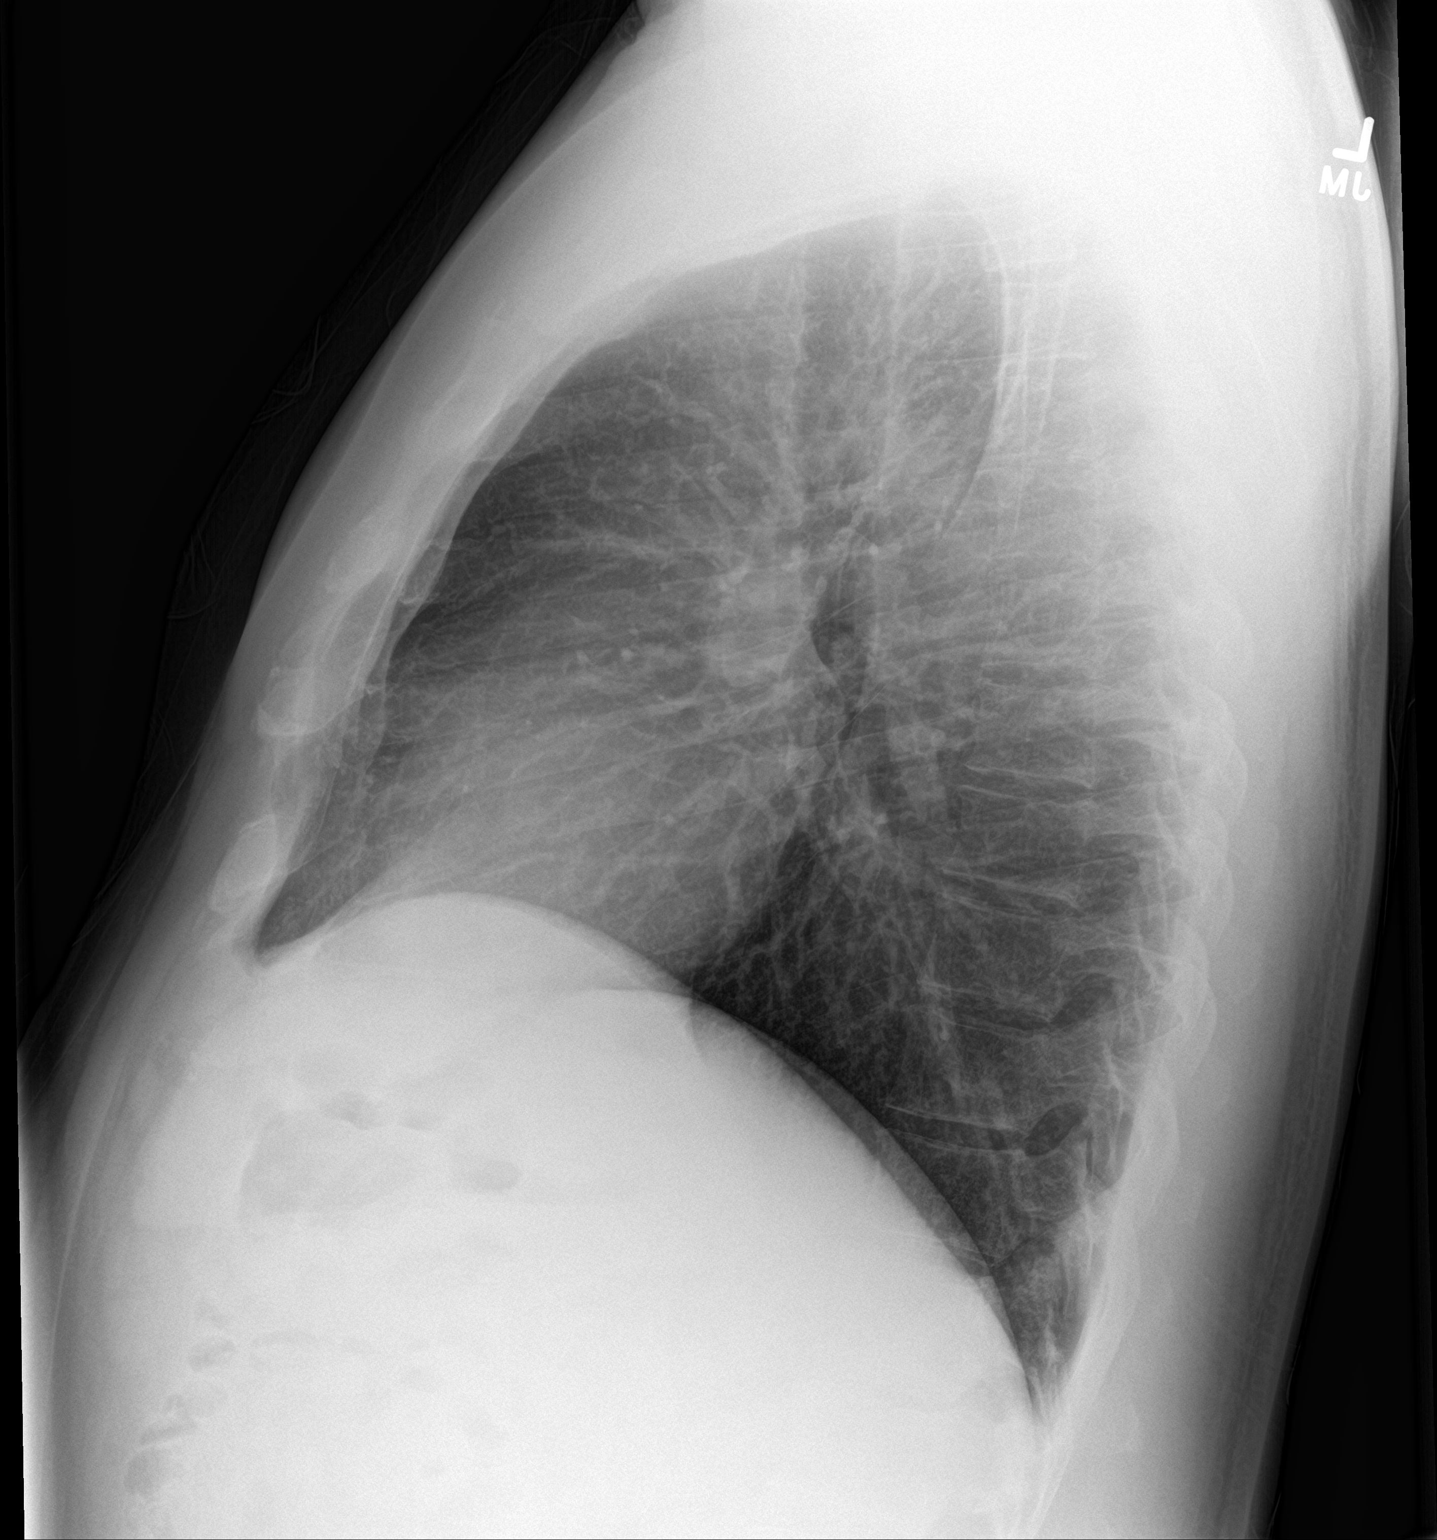

[2 of 2 positions shown; findings below may reference images not displayed]

FINDINGS: The heart size and mediastinal contours are within normal limits.
Both lungs are clear. The visualized skeletal structures are
unremarkable.
IMPRESSION: No active cardiopulmonary disease.

## 2020-10-20 MED ORDER — ONDANSETRON 4 MG PO TBDP
4.0000 mg | ORAL_TABLET | Freq: Three times a day (TID) | ORAL | 0 refills | Status: DC | PRN
Start: 2020-10-20 — End: 2020-10-29

## 2020-10-20 NOTE — Patient Instructions (Addendum)
You have likely viral gastroenteritis. I want you to rest, hydrate, follow bland diet guidlines and take tylenol for fever.  Take imodium otc for diarrhea. For nausea of vomiting, I am prescribing zofran.   Hopefully you recover quickly as wife did. However if persisting high number stools then pick up stool kit from our lab and turn in before weekend as results can take 3-5 days.   Return to work note on Friday sent as we discussed.    Follow up 7-10 days or sooner if needed.

## 2020-10-20 NOTE — Progress Notes (Signed)
   Subjective:    Patient ID: Bobby Hobbs, male    DOB: Jan 21, 1981, 40 y.o.   MRN: [id number]  HPI  Virtual Visit via Video Note  I connected with [place] on 10/20/20 at  9:40 AM EST by a video enabled telemedicine application and verified that I am speaking with the correct person using two identifiers.  Location: Patient: home Provider: office  Participants- pt and myself.   I discussed the limitations of evaluation and management by telemedicine and the availability of in person appointments. The patient expressed understanding and agreed to proceed.  No bp checked. No cuff. Asked pt to weight self and send my chart message.  History of Present Illness:   Pt states he started early Monday morning with loose stools. He vomited on time on Monday. Pt having loose stools about 8-9 times. Today had 2 loose stools today.   Antibiotics one month ago or so. No diarrhea with antibiotic.  Pt wife had recent GI illness Saturday, Sunday and Monday. Her symptoms are now resolved.   Observations/Objective:  General-no acute distress, pleasant, oriented. Lungs- on inspection lungs appear unlabored. Neck- no tracheal deviation or jvd on inspection. Neuro- gross motor function appears intact.  Assessment and Plan: You have likely viral gastroenteritis. I want you to rest, hydrate, follow bland diet guidlines and take tylenol for fever.  Take imodium otc for diarrhea. For nausea of vomiting, I am prescribing zofran.   Hopefully you recover quickly as wife did. However if persisting high number stools then pick up stool kit from our lab and turn in before weekend as results can take 3-5 days.   Return to work note on Friday sent as we discussed.  Follow up 7-10 days or sooner if needed.  Follow Up Instructions:    I discussed the assessment and treatment plan with the patient. The patient was provided an opportunity to ask questions and all were answered. The  patient agreed with the plan and demonstrated an understanding of the instructions.   The patient was advised to call back or seek an in-person evaluation if the symptoms worsen or if the condition fails to improve as anticipated.  Time spent with patient today was 20  minutes which consisted of chart revdiew, discussing diagnosis, work up treatment and documentation.   Mackie Pai, PA-C   Review of Systems  Constitutional: Negative for chills, fatigue and fever.  HENT: Negative for congestion, ear pain, postnasal drip, rhinorrhea, sinus pressure and sinus pain.   Respiratory: Negative for choking.   Gastrointestinal: Positive for abdominal pain.       Mild cramping with loose stools and occasional/intemittent during the day.  Genitourinary: Negative for decreased urine volume, dysuria, frequency, hematuria, penile pain, penile swelling and urgency.  Musculoskeletal: Negative for back pain.  Skin: Negative for rash.  Neurological: Negative for dizziness, speech difficulty, weakness, numbness and headaches.  Hematological: Negative for adenopathy. Does not bruise/bleed easily.  Psychiatric/Behavioral: Negative for behavioral problems and confusion.       Objective:   Physical Exam        Assessment & Plan:

## 2020-10-29 ENCOUNTER — Other Ambulatory Visit: Payer: Self-pay

## 2020-10-29 ENCOUNTER — Telehealth (INDEPENDENT_AMBULATORY_CARE_PROVIDER_SITE_OTHER): Payer: 59 | Admitting: Family

## 2020-10-29 VITALS — Temp 100.4°F

## 2020-10-29 DIAGNOSIS — Z20822 Contact with and (suspected) exposure to covid-19: Secondary | ICD-10-CM

## 2020-10-29 MED ORDER — PROMETHAZINE-DM 6.25-15 MG/5ML PO SYRP
5.0000 mL | ORAL_SOLUTION | Freq: Four times a day (QID) | ORAL | 0 refills | Status: DC | PRN
Start: 2020-10-29 — End: 2021-03-24

## 2020-10-29 NOTE — Progress Notes (Signed)
Virtual Visit via Video Note  I connected with Bobby Hobbs on 10/29/20 at  3:40 PM EST by a video enabled telemedicine application and verified that I am speaking with the correct person using two identifiers.  Location: Patient: home  Provider: work   I discussed the limitations of evaluation and management by telemedicine and the availability of in person appointments. The patient expressed understanding and agreed to proceed. Only the patient and myself were present for today's video call.   History of Present Illness:  Patient reports temp 100-100.4, body aches, cough, headache and congestion.  Had covid testing at his work yesterday.  He denies known covid contacts.  Mother is sick as well with similar symptoms.   He denies any worsening asthma symptoms.    He is unvaccinated against covid.     Observations/Objective:   Gen: Awake, alert, no acute distress Resp: Breathing is even and non-labored, coughing frequently Psych: calm/pleasant demeanor Neuro: Alert and Oriented x 3, + facial symmetry, speech is clear.   Assessment and Plan:  Suspected covid-19 infection- await results from his testing. He is advised to contact me with his results when they become available.  Advised of CDC guidelines for self isolation/ ending isolation.  Advised of safe practice guidelines. Symptom Tier reviewed.  Encouraged to monitor for any worsening symptoms; watch for increased shortness of breath, weakness, and signs of dehydration. Advised when to seek emergency care.  Instructed to rest and hydrate well.  Advised to leave the house during recommended isolation period, only if it is necessary to seek medical care   Follow Up Instructions:    I discussed the assessment and treatment plan with the patient. The patient was provided an opportunity to ask questions and all were answered. The patient agreed with the plan and demonstrated an understanding of the instructions.   The  patient was advised to call back or seek an in-person evaluation if the symptoms worsen or if the condition fails to improve as anticipated.  Nance Pear, NP

## 2020-10-31 ENCOUNTER — Encounter: Payer: Self-pay | Admitting: Family

## 2020-11-02 ENCOUNTER — Encounter: Payer: Self-pay | Admitting: Family

## 2020-11-06 ENCOUNTER — Other Ambulatory Visit: Payer: Self-pay

## 2020-11-06 ENCOUNTER — Telehealth (INDEPENDENT_AMBULATORY_CARE_PROVIDER_SITE_OTHER): Payer: 59 | Admitting: Medical

## 2020-11-06 ENCOUNTER — Other Ambulatory Visit: Payer: Self-pay | Admitting: Medical

## 2020-11-06 VITALS — HR 71 | Temp 100.8°F

## 2020-11-06 DIAGNOSIS — U071 COVID-19: Secondary | ICD-10-CM

## 2020-11-06 DIAGNOSIS — R0981 Nasal congestion: Secondary | ICD-10-CM

## 2020-11-06 DIAGNOSIS — R059 Cough, unspecified: Secondary | ICD-10-CM

## 2020-11-06 MED ORDER — AZITHROMYCIN 250 MG PO TABS
ORAL_TABLET | ORAL | 0 refills | Status: DC
Start: 2020-11-06 — End: 2021-01-26

## 2020-11-06 MED ORDER — BENZONATATE 100 MG PO CAPS: ORAL_CAPSULE | ORAL | 0 refills | Status: DC

## 2020-11-06 NOTE — Progress Notes (Addendum)
   Subjective:    Patient ID: Bobby Hobbs, male    DOB: February 04, 1981, 40 y.o.   MRN: [id number]  HPI  Virtual Visit via Video Note  I connected with [place] on 11/06/20 at  2:40 PM EST by a video enabled telemedicine application and verified that I am speaking with the correct person using two identifiers.  Location: Patient: home Provider: office  particpants- pt and myself.    I discussed the limitations of evaluation and management by telemedicine and the availability of in person appointments. The patient expressed understanding and agreed to proceed.  History of Present Illness:   Pt had covid infection recently. Pt had + covid test on Friday. Was feeling symptoms since 17 th.   Pt retested on Tuesday this week and still positive.  Pt did not get vaccinated. Pt has had cough, bodyaches, fatigue and dry heaves at times due to cough. Some sinus pressure.  Pt can taste but slight altered.   No sob or wheezing.  Has no 02 sat monitor.  Pt states has tried promethazine DM for cough but has not helped.  Pt works at [employer] facility.   Observations/Objective:  General-no acute distress, pleasant, oriented. Lungs- on inspection lungs appear unlabored. Neck- no tracheal deviation or jvd on inspection. Neuro- gross motor function appears intact. heent- some mild left maxillary sinus pressure.    Assessment and Plan: Video visit for Covid infection.  Tested positive on Friday week ago and symptomatic since around January 17.  Patient is unvaccinated.  Symptoms most recently of fever, cough, fatigue and sinus pressure left maxillary tenderness.  Note no shortness of breath or wheezing.  Patient has not have O2 sat monitor.  I did educate on benefit of knowing O2 sat percentage levels.  Educated him on what his normal level.  Encouraged him to get O2 sat monitor over-the-counter.  Could use vitamin D 4000 international units daily.  And zinc 50  mg daily.  Patient has Flonase at home and advised him to use 2 sprays each nostril once daily.  For cough prescribed benzonatate 100 mg tablets.  Can take 1 to 2 tablets every 8 hours as needed cough.  Azithromycin antibiotic sent to patient's pharmacy.  Cover for possible sinusitis.  If cough persisting by early next week then put in future chest x-ray.  If symptoms not improved much at all or worsening then advised to call the Covid clinic line which is [phone] for in person appointment/evaluation.  Follow-up in 7 to 10 days or as needed.  Follow Up Instructions:    I discussed the assessment and treatment plan with the patient. The patient was provided an opportunity to ask questions and all were answered. The patient agreed with the plan and demonstrated an understanding of the instructions.   The patient was advised to call back or seek an in-person evaluation if the symptoms worsen or if the condition fails to improve as anticipated.  Time spent with patient today was 30 minutes which consisted of chart review, discussing diagnosis, work up, treatment and documentation.   Mackie Pai, PA-C   Review of Systems     Objective:   Physical Exam        Assessment & Plan:

## 2020-11-06 NOTE — Patient Instructions (Addendum)
Video visit for Covid infection.  Tested positive on Friday week ago and symptomatic since around January 17.  Patient is unvaccinated.  Symptoms most recently of fever, cough, fatigue and sinus pressure left maxillary tenderness.  Note no shortness of breath or wheezing.  Patient has not have O2 sat monitor.  I did educate on benefit of knowing O2 sat percentage levels.  Educated him on what his normal level.  Encouraged him to get O2 sat monitor over-the-counter.  Could use vitamin D 4000 international units daily.  And zinc 50 mg daily.  Patient has Flonase at home and advised him to use 2 sprays each nostril once daily.  For cough prescribed benzonatate 100 mg tablets.  Can take 1 to 2 tablets every 8 hours as needed cough.  Azithromycin antibiotic sent to patient's pharmacy.  Cover for possible sinusitis.  If cough persisting by early next week then put in future chest x-ray.  If symptoms not improved much at all or worsening then advised to call the Covid clinic line which is [phone] for in person appointment/evaluation.  Follow-up in 7 to 10 days or as needed.

## 2020-11-09 NOTE — Telephone Encounter (Signed)
Pharmacy note states they need dosage clarity

## 2020-11-09 NOTE — Telephone Encounter (Signed)
Benzonatate rx sent to pt pharmacy.

## 2020-11-21 ENCOUNTER — Encounter: Payer: Self-pay | Admitting: Medical

## 2020-11-24 ENCOUNTER — Ambulatory Visit (HOSPITAL_BASED_OUTPATIENT_CLINIC_OR_DEPARTMENT_OTHER)
Admission: RE | Admit: 2020-11-24 | Discharge: 2020-11-24 | Disposition: A | Discharge status: Home / Self Care | Payer: 59 | Source: Ambulatory Visit | Attending: Medical | Admitting: Medical

## 2020-11-24 ENCOUNTER — Other Ambulatory Visit: Payer: Self-pay

## 2020-11-24 DIAGNOSIS — R059 Cough, unspecified: Secondary | ICD-10-CM | POA: Diagnosis not present

## 2020-11-25 ENCOUNTER — Encounter: Payer: Self-pay | Admitting: Medical

## 2020-11-25 NOTE — Telephone Encounter (Signed)
Patient called office to schedule an appointment.

## 2020-11-26 ENCOUNTER — Other Ambulatory Visit: Payer: Self-pay

## 2020-11-26 ENCOUNTER — Ambulatory Visit: Payer: 59 | Admitting: Medical

## 2020-11-26 VITALS — BP 111/70 | HR 74 | Temp 98.9°F | Resp 20 | Ht 73.0 in | Wt 218.0 lb

## 2020-11-26 DIAGNOSIS — R059 Cough, unspecified: Secondary | ICD-10-CM

## 2020-11-26 DIAGNOSIS — K2970 Gastritis, unspecified, without bleeding: Secondary | ICD-10-CM | POA: Diagnosis not present

## 2020-11-26 DIAGNOSIS — J4540 Moderate persistent asthma, uncomplicated: Secondary | ICD-10-CM | POA: Diagnosis not present

## 2020-11-26 MED ORDER — PREDNISONE 10 MG (21) PO TBPK
ORAL_TABLET | ORAL | 0 refills | Status: DC
Start: 2020-11-26 — End: 2021-01-26

## 2020-11-26 NOTE — Progress Notes (Signed)
Subjective:    Patient ID: [place], male    DOB: 1981/08/29, 41 y.o.   MRN: [id number]  HPI  Pt in for follow up.  Pt got covid infection around January 10 or 11, 2022.(tested at work)  Pt has lingering cough and some shortness of breath.   11-24-2020 cxr IMPRESSION: Low lung volumes. No evidence of infiltrate. No acute cardiopulmonary disease identified. Chest is stable from prior Exams.   Pt evaluated on 10-29-20. Below is A/P in " Given cough med rx.   "Suspected covid-19 infection- await results from his testing. He is advised to contact me with his results when they become available.  Advised of CDC guidelines for self isolation/ ending isolation. Advised of safe practice guidelines. Symptom Tier reviewed. Encouraged to monitor for any worsening symptoms; watch for increased shortness of breath, weakness, and signs of dehydration.Advised when to seek emergency care. Instructed to rest and hydrate well. Advised to leave the house during recommended isolation period, only if it is necessary to seek medical care"  Pt does have hx of asthma. See pulmonology notes. Pt is still using symbicort. 2 inhalation twice daily.   Pt states not hearing himself wheezing. He states random sensation of needing to take deep breath. He walked 5,000 steps last night. He states cold air will set off cough.   Pt is not smoker.   Pt has hx of gerd. Pt is on protonix. Has not had any obvious reflux type signs or symptoms.   Review of Systems  Constitutional: Negative for chills, fatigue and fever.  HENT: Negative for congestion, ear pain, hearing loss, mouth sores, nosebleeds and rhinorrhea.   Respiratory: Positive for cough and shortness of breath. Negative for chest tightness and wheezing.        Very mild rare random sob. See hpi.  Cardiovascular: Negative for chest pain and palpitations.  Gastrointestinal: Negative for abdominal pain, constipation and nausea.   Musculoskeletal: Negative for back pain and neck pain.  Skin: Negative for rash.  Neurological: Negative for dizziness, speech difficulty, weakness, numbness and headaches.  Hematological: Negative for adenopathy. Does not bruise/bleed easily.  Psychiatric/Behavioral: Negative for behavioral problems, confusion and sleep disturbance. The patient is not nervous/anxious.     Past Medical History:  Diagnosis Date  . Allergy   . Asthma   . Diarrhea 09/06/2019  . GERD (gastroesophageal reflux disease)   . Irregular heart beat    Per pt very now and then pt said he has an extra heart beat  . Vomiting 09/06/2019     Social History   Socioeconomic History  . Marital status: Married    Spouse name: Janett Billow  . Number of children: 0  . Years of education: Not on file  . Highest education level: Not on file  Occupational History  . Occupation: Production assistant, radio  Tobacco Use  . Smoking status: Never Smoker  . Smokeless tobacco: Never Used  Vaping Use  . Vaping Use: Never used  Substance and Sexual Activity  . Alcohol use: Yes    Alcohol/week: 0.0 standard drinks    Comment: once a year  . Drug use: No  . Sexual activity: Yes  Other Topics Concern  . Not on file  Social History Narrative  . Not on file   Social Determinants of Health   Financial Resource Strain: Not on file  Food Insecurity: Not on file  Transportation Needs: Not on file  Physical Activity: Not on file  Stress: Not on file  Social Connections: Not on file  Intimate Partner Violence: Not on file    Past Surgical History:  Procedure Laterality Date  . LAPAROSCOPIC APPENDECTOMY N/A 12/28/2014   Procedure: APPENDECTOMY LAPAROSCOPIC;  Surgeon: Armandina Gemma, MD;  Location: [facility];  Service: General;  Laterality: N/A;    Family History  Problem Relation Age of Onset  . Hypertension Mother   . Achalasia Mother   . Diabetes Father   . COPD Father   . Diabetes Maternal Grandmother   . Colon cancer Neg Hx    . Esophageal cancer Neg Hx   . Rectal cancer Neg Hx   . Stomach cancer Neg Hx     Allergies  Allergen Reactions  . Montelukast Other (See Comments)    headache  . Dairy Aid [Lactase]   . Hycodan [Hydrocodone-Homatropine] Nausea And Vomiting    Tolerates hydrocodone with acetaminophen    Current Outpatient Medications on File Prior to Visit  Medication Sig Dispense Refill  . albuterol (VENTOLIN HFA) 108 (90 Base) MCG/ACT inhaler Use 2 inhalations every 4-6 hours as needed for cough and wheezing. 8 g 5  . budesonide-formoterol (SYMBICORT) 80-4.5 MCG/ACT inhaler Inhale 2 puffs into the lungs 2 (two) times daily. 10.2 g 5  . fluticasone (FLONASE) 50 MCG/ACT nasal spray Place 2 sprays into both nostrils daily. 16 g 1  . pantoprazole (PROTONIX) 40 MG tablet Take 1 tablet (40 mg total) by mouth daily. 30 tablet 11  . azithromycin (ZITHROMAX) 250 MG tablet Take 2 tablets by mouth on day 1, followed by 1 tablet by mouth daily for 4 days. (Patient not taking: Reported on 11/26/2020) 6 tablet 0  . benzonatate (TESSALON) 100 MG capsule TAKE 1 TO 2 CAPSULES BY MOUTH EVERY 8 hours prn cough (Patient not taking: Reported on 11/26/2020) 30 capsule 0  . promethazine-dextromethorphan (PROMETHAZINE-DM) 6.25-15 MG/5ML syrup Take 5 mLs by mouth 4 (four) times daily as needed for cough. (Patient not taking: Reported on 11/26/2020) 200 mL 0   Current Facility-Administered Medications on File Prior to Visit  Medication Dose Route Frequency Provider Last Rate Last Admin  . 0.9 %  sodium chloride infusion  500 mL Intravenous Once Jackquline Denmark, MD        BP 111/70   Pulse 74   Temp 98.9 F (37.2 C) (Oral)   Resp 20   Ht 6\' 1"  (1.854 m)   Wt 218 lb (98.9 kg)   SpO2 99%   BMI 28.76 kg/m       Objective:   Physical Exam  General Mental Status- Alert. General Appearance- Not in acute distress.   Skin General: Color- Normal Color. Moisture- Normal Moisture.  Neck Carotid Arteries- Normal color.  Moisture- Normal Moisture. No carotid bruits. No JVD.  Chest and Lung Exam Auscultation: Breath Sounds:-Normal.  Cardiovascular Auscultation:Rythm- Regular. Murmurs & Other Heart Sounds:Auscultation of the heart reveals- No Murmurs.  . Neurologic Cranial Nerve exam:- CN III-XII intact(No nystagmus), symmetric smile. Strength:- 5/5 equal and symmetric strength both upper and lower extremities.      Assessment & Plan:  History of covid infection. Appear recovered but likely exacerbation of your asthma from viral infection. Continue symbicort  2 inh twice daily and albuterol as needed. Will prescribe taper dose prednisone.  For cough recommend continue benzonatate if needed.  Hx of gastritis and symptoms controlled with protonix.  Follow up 10-14 days if symptoms persistent. Sooner if worsens or changes.  Mackie Pai, PA-C

## 2020-11-26 NOTE — Patient Instructions (Addendum)
History of covid infection. Appear recovered but likely exacerbation of your  asthma from viral infection. Continue symbicort  2 inh twice daily and albuterol as needed. Will prescribe taper dose prednisone.  For cough recommend continue benzonatate if needed.  Hx of gastritis and symptoms controlled with protonix.  Follow up 10-14 days if symptoms persistent. Sooner if worsens or changes.

## 2021-01-04 ENCOUNTER — Encounter: Payer: Self-pay | Admitting: Medical

## 2021-01-04 ENCOUNTER — Telehealth (INDEPENDENT_AMBULATORY_CARE_PROVIDER_SITE_OTHER): Payer: 59 | Admitting: Medical

## 2021-01-04 VITALS — BP 128/80

## 2021-01-04 DIAGNOSIS — R059 Cough, unspecified: Secondary | ICD-10-CM | POA: Diagnosis not present

## 2021-01-04 DIAGNOSIS — J0100 Acute maxillary sinusitis, unspecified: Secondary | ICD-10-CM | POA: Diagnosis not present

## 2021-01-04 DIAGNOSIS — J309 Allergic rhinitis, unspecified: Secondary | ICD-10-CM | POA: Diagnosis not present

## 2021-01-04 MED ORDER — DOXYCYCLINE HYCLATE 100 MG PO TABS
100.0000 mg | ORAL_TABLET | Freq: Two times a day (BID) | ORAL | 0 refills | Status: DC
Start: 2021-01-04 — End: 2021-01-26

## 2021-01-04 NOTE — Progress Notes (Signed)
Subjective:    Patient ID: Bobby Hobbs, male    DOB: 09-03-1981, 40 y.o.   MRN: [id number]  HPI  Virtual Visit via Video Note  I connected with Tsaile on 01/04/21 at  9:40 AM EDT by a video enabled telemedicine application and verified that I am speaking with the correct person using two identifiers.  Location: Patient: home Provider: office  Particpants- pt and myself.     I discussed the limitations of evaluation and management by telemedicine and the availability of in person appointments. The patient expressed understanding and agreed to proceed.  History of Present Illness:   Pt states about 2 weeks ago he got nasal congested and was sneezing. He states as weather warmed and pollen fell he got symptoms. Then gradually got sinus pressure and colored mucus intermittent when he blows his nose. Maxillary sinus are most tender. No fever. Rare chills. In morning will bring up some mucus. Pt states get sinus infection in spring and fall. On average about twice a year.  Pt has not been wheezing. He is on symbicort daily and breathing controlled.   Hx of allergies and he uses claritin and flonase.    Observations/Objective: General  Mental Status - Alert. General Appearance - Well groomed. Not in acute distress.  Skin Rashes- No Rashes.  HEENT Head- Normal. Ear Auditory Canal - Left- Normal. Right - Normal.Tympanic Membrane- Left- Normal. Right- Normal. Eye Sclera/Conjunctiva- Left- Normal. Right- Normal. Nose & Sinuses Nasal Mucosa- Left-  Boggy and Congested. Right-  Boggy and Congested.Bilateral maxillary and frontal sinus pressure. Mouth & Throat Lips: Upper Lip- Normal: no dryness, cracking, pallor, cyanosis, or vesicular eruption. Lower Lip-Normal: no dryness, cracking, pallor, cyanosis or vesicular eruption. Buccal Mucosa- Bilateral- No Aphthous ulcers. Oropharynx- No Discharge or Erythema. Tonsils: Characteristics- Bilateral- No  Erythema or Congestion. Size/Enlargement- Bilateral- No enlargement. Discharge- bilateral-None.  Neck Neck- Supple. No Masses.   Chest and Lung Exam Auscultation: Breath Sounds:-Clear even and unlabored.  Cardiovascular Auscultation:Rythm- Regular, rate and rhythm. Murmurs & Other Heart Sounds:Ausculatation of the heart reveal- No Murmurs.  Lymphatic Head & Neck General Head & Neck Lymphatics: Bilateral: Description- No Localized lymphadenopathy.   Assessment and Plan: 2 weeks of initially allergic rhinitis signs symptoms but over the last week significant maxillary sinus pressure.  Appears now likely sinus infection from allergies.  Continue Claritin tablet daily along with Flonase.  If your allergy symptoms do not improve then we could switch you from Claritin to Xyzal.  We will likely update if you want to do that.  Prescribe doxycycline antibiotic for sinus infection.  Rx advisement given.  History of asthma and stable presently per your description.  Continue Symbicort.   Return to work note on a Wednesday.  If your symptoms persist or worsen then recommend getting Covid test prior to return.  Presently with low infection rates and knowing that you had Covid in January I do think extremely unlikely that you have Covid.  Follow-up in 7 to 10 days or as needed.  Time spent with patient today was 20  minutes which consisted of chart revdiew, discussing diagnosis, work up treatment and documentation.  Mackie Pai, PA-C     Follow Up Instructions:    I discussed the assessment and treatment plan with the patient. The patient was provided an opportunity to ask questions and all were answered. The patient agreed with the plan and demonstrated an understanding of the instructions.   The patient was advised to call  back or seek an in-person evaluation if the symptoms worsen or if the condition fails to improve as anticipated.     Mackie Pai, PA-C   Review of  Systems  Constitutional: Negative for chills, fatigue and fever.  HENT: Positive for sinus pressure. Negative for congestion, ear pain, mouth sores, postnasal drip and sneezing.   Respiratory: Positive for cough. Negative for shortness of breath and wheezing.        Minimal cough.  Cardiovascular: Negative for chest pain and palpitations.  Gastrointestinal: Negative for abdominal pain.  Genitourinary: Negative for enuresis and flank pain.  Neurological: Negative for dizziness, speech difficulty, weakness, numbness and headaches.  Hematological: Negative for adenopathy. Does not bruise/bleed easily.  Psychiatric/Behavioral: Negative for behavioral problems and confusion.    Past Medical History:  Diagnosis Date  . Allergy   . Asthma   . Diarrhea 09/06/2019  . GERD (gastroesophageal reflux disease)   . Irregular heart beat    Per pt very now and then pt said he has an extra heart beat  . Vomiting 09/06/2019     Social History   Socioeconomic History  . Marital status: Married    Spouse name: Janett Billow  . Number of children: 0  . Years of education: Not on file  . Highest education level: Not on file  Occupational History  . Occupation: Production assistant, radio  Tobacco Use  . Smoking status: Never Smoker  . Smokeless tobacco: Never Used  Vaping Use  . Vaping Use: Never used  Substance and Sexual Activity  . Alcohol use: Yes    Alcohol/week: 0.0 standard drinks    Comment: once a year  . Drug use: No  . Sexual activity: Yes  Other Topics Concern  . Not on file  Social History Narrative  . Not on file   Social Determinants of Health   Financial Resource Strain: Not on file  Food Insecurity: Not on file  Transportation Needs: Not on file  Physical Activity: Not on file  Stress: Not on file  Social Connections: Not on file  Intimate Partner Violence: Not on file    Past Surgical History:  Procedure Laterality Date  . LAPAROSCOPIC APPENDECTOMY N/A 12/28/2014    Procedure: APPENDECTOMY LAPAROSCOPIC;  Surgeon: Armandina Gemma, MD;  Location: [facility];  Service: General;  Laterality: N/A;    Family History  Problem Relation Age of Onset  . Hypertension Mother   . Achalasia Mother   . Diabetes Father   . COPD Father   . Diabetes Maternal Grandmother   . Colon cancer Neg Hx   . Esophageal cancer Neg Hx   . Rectal cancer Neg Hx   . Stomach cancer Neg Hx     Allergies  Allergen Reactions  . Montelukast Other (See Comments)    headache  . Dairy Aid [Lactase]   . Hycodan [Hydrocodone-Homatropine] Nausea And Vomiting    Tolerates hydrocodone with acetaminophen    Current Outpatient Medications on File Prior to Visit  Medication Sig Dispense Refill  . albuterol (VENTOLIN HFA) 108 (90 Base) MCG/ACT inhaler Use 2 inhalations every 4-6 hours as needed for cough and wheezing. 8 g 5  . azithromycin (ZITHROMAX) 250 MG tablet Take 2 tablets by mouth on day 1, followed by 1 tablet by mouth daily for 4 days. (Patient not taking: Reported on 11/26/2020) 6 tablet 0  . benzonatate (TESSALON) 100 MG capsule TAKE 1 TO 2 CAPSULES BY MOUTH EVERY 8 hours prn cough (Patient not taking: Reported on 11/26/2020) 30  capsule 0  . budesonide-formoterol (SYMBICORT) 80-4.5 MCG/ACT inhaler Inhale 2 puffs into the lungs 2 (two) times daily. 10.2 g 5  . fluticasone (FLONASE) 50 MCG/ACT nasal spray Place 2 sprays into both nostrils daily. 16 g 1  . pantoprazole (PROTONIX) 40 MG tablet Take 1 tablet (40 mg total) by mouth daily. 30 tablet 11  . predniSONE (STERAPRED UNI-PAK 21 TAB) 10 MG (21) TBPK tablet Taper over 6 days. 21 tablet 0  . promethazine-dextromethorphan (PROMETHAZINE-DM) 6.25-15 MG/5ML syrup Take 5 mLs by mouth 4 (four) times daily as needed for cough. (Patient not taking: Reported on 11/26/2020) 200 mL 0   Current Facility-Administered Medications on File Prior to Visit  Medication Dose Route Frequency Provider Last Rate Last Admin  . 0.9 %  sodium chloride infusion  500  mL Intravenous Once Jackquline Denmark, MD        There were no vitals taken for this visit.      Objective:   Physical Exam        Assessment & Plan:

## 2021-01-04 NOTE — Patient Instructions (Signed)
2 weeks of initially allergic rhinitis signs symptoms but over the last week significant maxillary sinus pressure.  Appears now likely sinus infection from allergies.  Continue Claritin tablet daily along with Flonase.  If your allergy symptoms do not improve then we could switch you from Claritin to Xyzal.  We will likely update if you want to do that.  Prescribe doxycycline antibiotic for sinus infection.  Rx advisement given.  History of asthma and stable presently per your description.  Continue Symbicort.   Return to work note on a Wednesday.  If your symptoms persist or worsen then recommend getting Covid test prior to return.  Presently with low infection rates and knowing that you had Covid in January I do think extremely unlikely that you have Covid.  Follow-up in 7 to 10 days or as needed.

## 2021-01-20 IMAGING — DX DG KNEE 3 VIEWS*L*
3 series · 3 of 3 positions shown · non-contrast
Comparison: No prior.

CLINICAL DATA: Knee pain.  No injury.

EXAM:
LEFT KNEE - 3 VIEW

[knee ap]
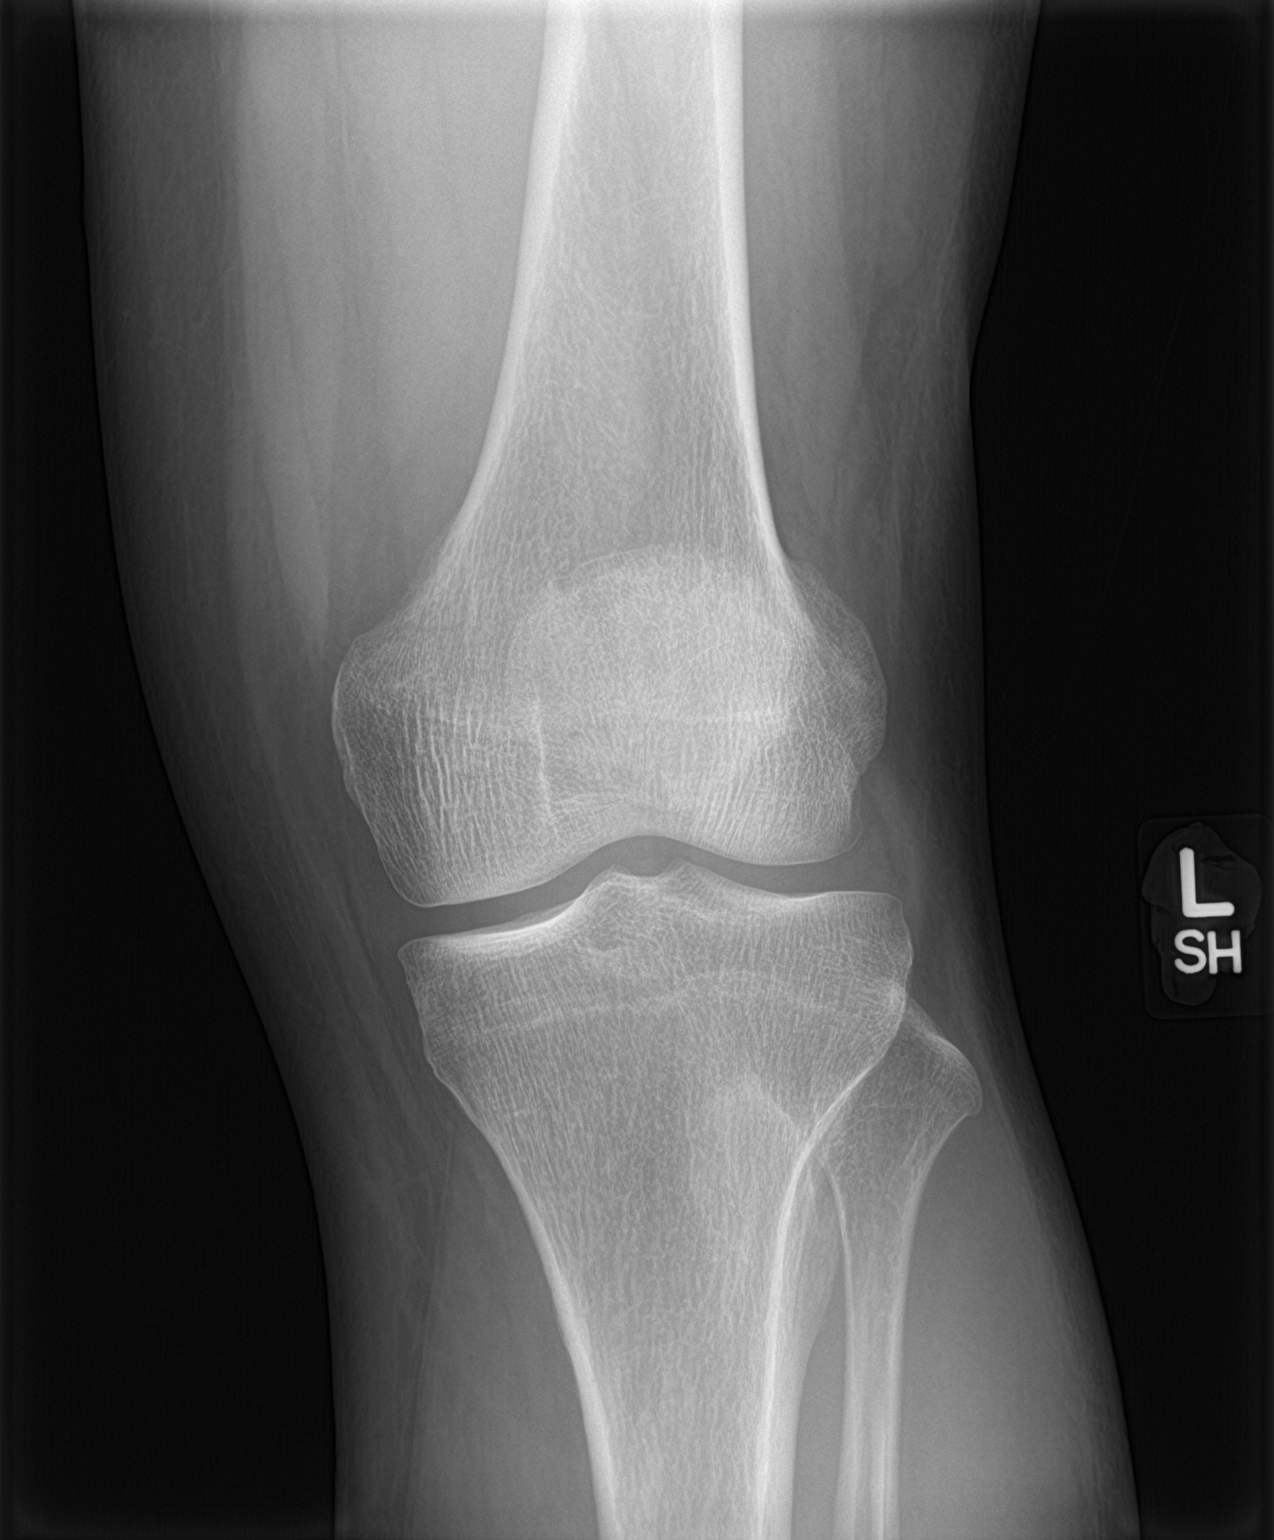

[knee lat]
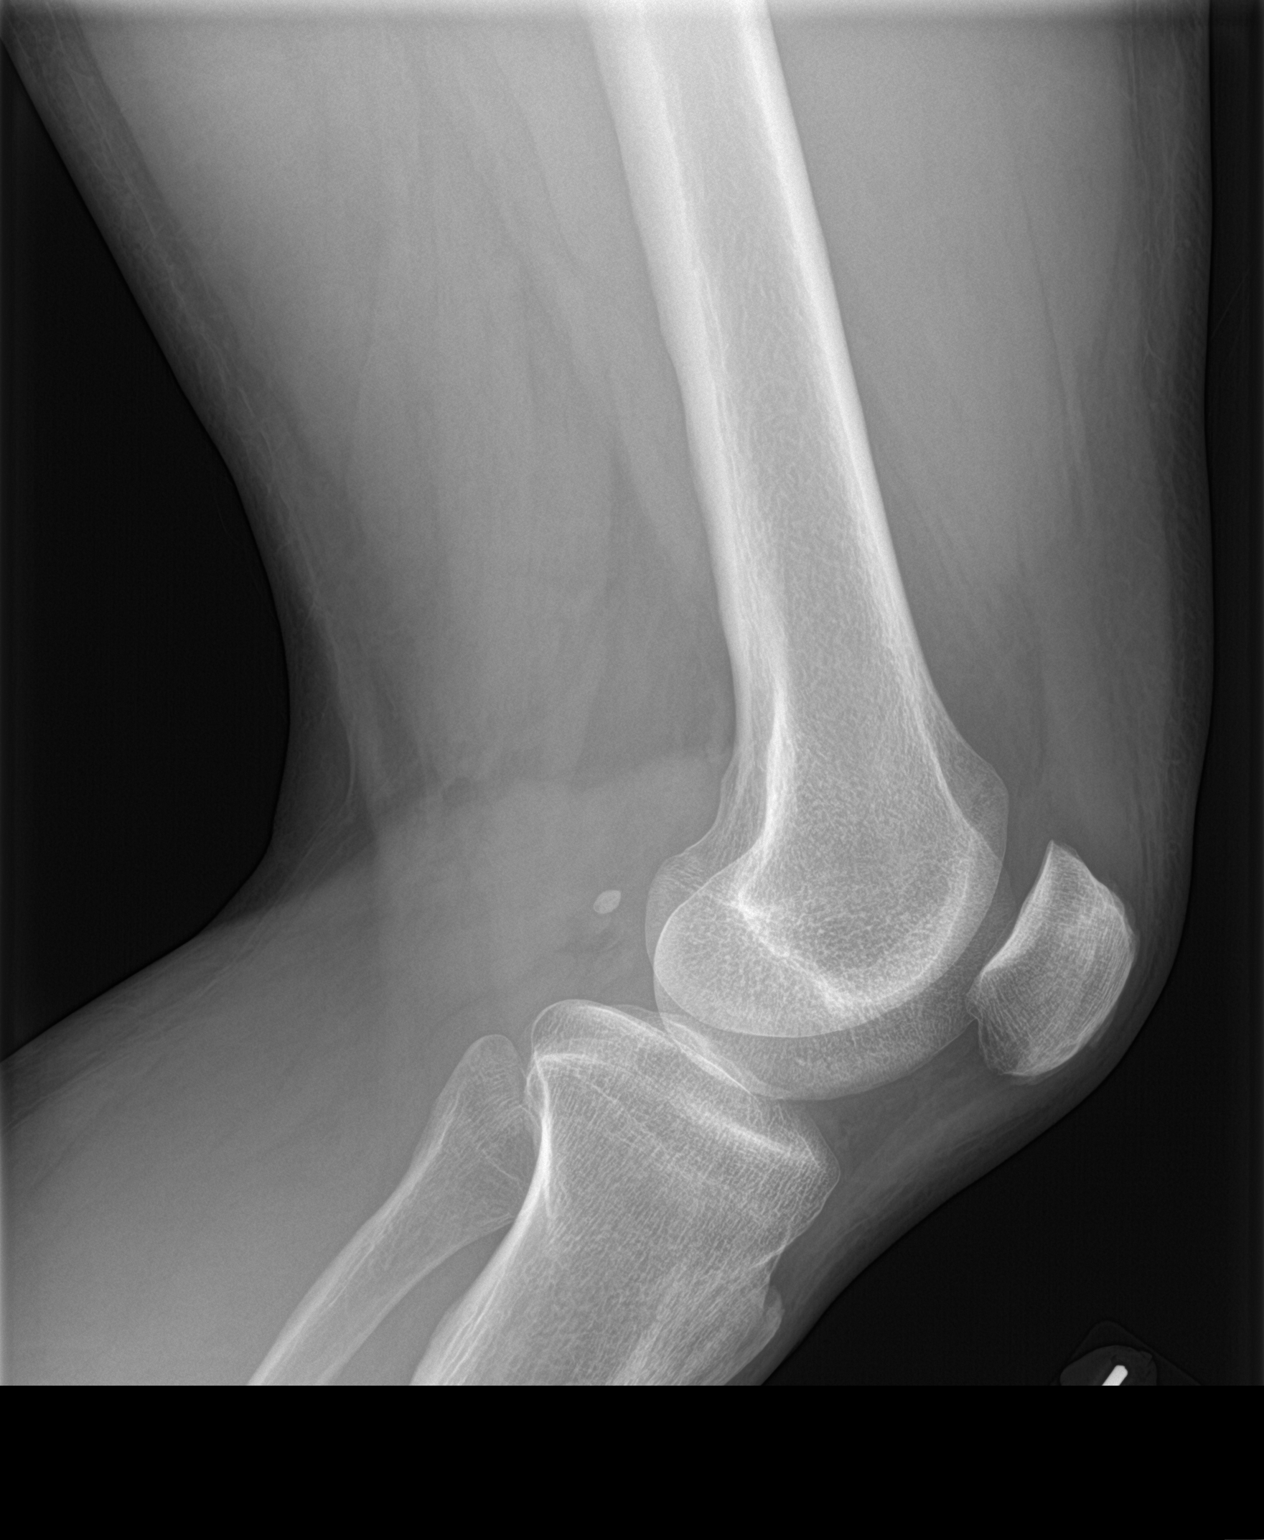

[knee sunrise]
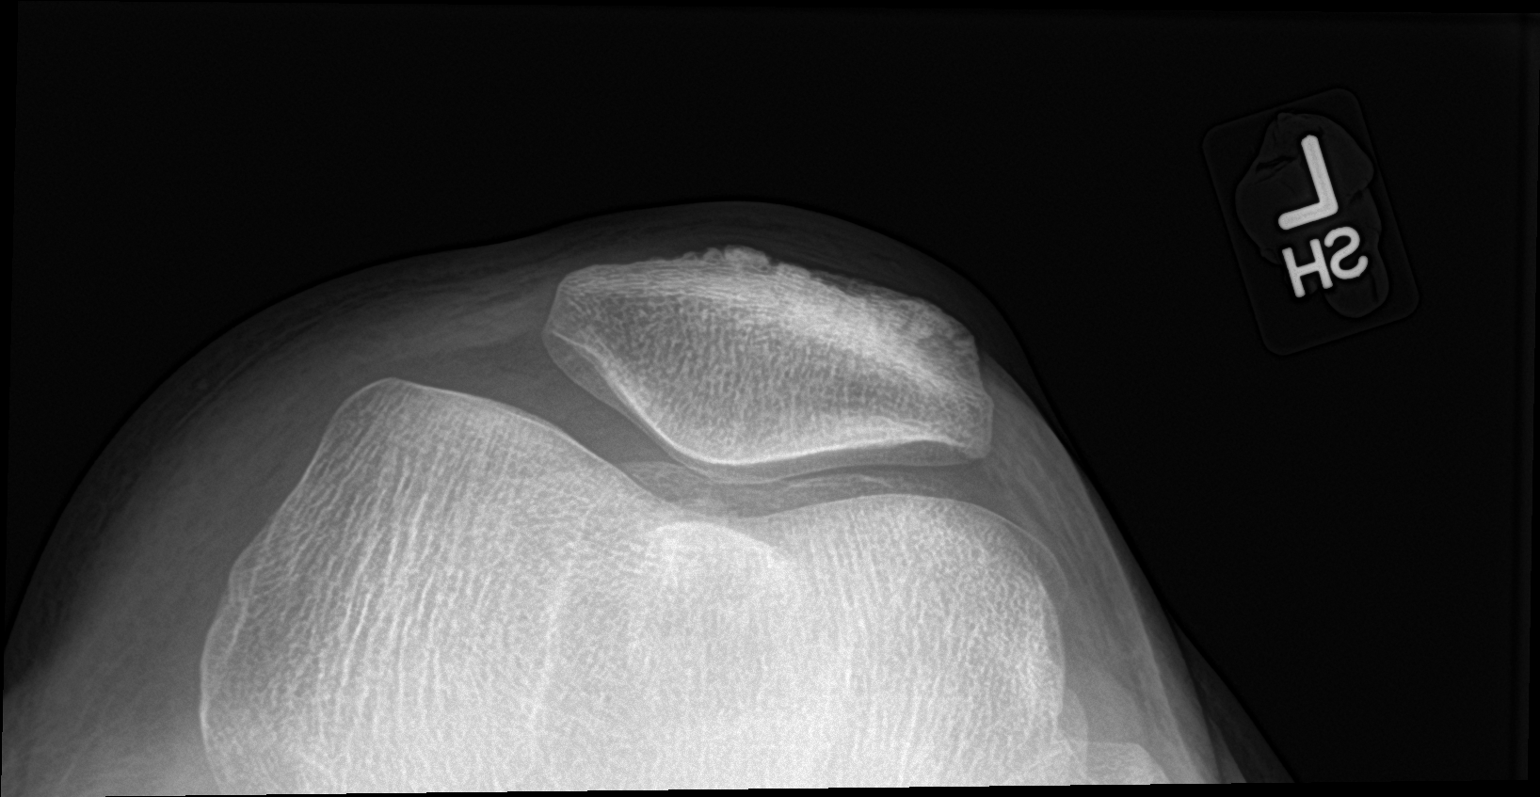

[3 of 3 positions shown; findings below may reference images not displayed]

FINDINGS: No acute bony or joint abnormality identified. No evidence of
fracture or dislocation.
IMPRESSION: No acute abnormality.

## 2021-01-26 ENCOUNTER — Encounter: Payer: Self-pay | Admitting: Internal Medicine

## 2021-01-26 ENCOUNTER — Telehealth (INDEPENDENT_AMBULATORY_CARE_PROVIDER_SITE_OTHER): Payer: 59 | Admitting: Internal Medicine

## 2021-01-26 VITALS — HR 90 | Ht 73.0 in | Wt 231.0 lb

## 2021-01-26 DIAGNOSIS — J302 Other seasonal allergic rhinitis: Secondary | ICD-10-CM | POA: Diagnosis not present

## 2021-01-26 MED ORDER — PREDNISONE 10 MG PO TABS: ORAL_TABLET | ORAL | 0 refills | Status: DC

## 2021-01-26 NOTE — Progress Notes (Signed)
Subjective:    Patient ID: Bobby Hobbs, male    DOB: 1981-04-13, 40 y.o.   MRN: [id number]  DOS:  01/26/2021 Type of visit - description: Virtual Visit via Video Note  I connected with the above patient  by a video enabled telemedicine application and verified that I am speaking with the correct person using two identifiers.   THIS ENCOUNTER IS A VIRTUAL VISIT DUE TO COVID-19 - PATIENT WAS NOT SEEN IN THE OFFICE. PATIENT HAS CONSENTED TO VIRTUAL VISIT / TELEMEDICINE VISIT   Location of patient: home  Location of provider: office  Persons participating in the virtual visit: patient, provider   I discussed the limitations of evaluation and management by telemedicine and the availability of in person appointments. The patient expressed understanding and agreed to proceed.  Acute Symptoms a started several days ago, he thinks related to pollen: + Maxillary and frontal sinus congestion, watery eyes, sneezing, postnasal dripping. He has asthma, mild increase in symptoms with occasional wheezing.  He denies fever, chills 1 day? Cough is mild No chest pain.   Review of Systems See above   Past Medical History:  Diagnosis Date  . Allergy   . Asthma   . Diarrhea 09/06/2019  . GERD (gastroesophageal reflux disease)   . Irregular heart beat    Per pt very now and then pt said he has an extra heart beat  . Vomiting 09/06/2019    Past Surgical History:  Procedure Laterality Date  . LAPAROSCOPIC APPENDECTOMY N/A 12/28/2014   Procedure: APPENDECTOMY LAPAROSCOPIC;  Surgeon: Armandina Gemma, MD;  Location: [facility];  Service: General;  Laterality: N/A;    Allergies as of 01/26/2021      Reactions   Montelukast Other (See Comments)   headache   Dairy Aid [lactase]    Hycodan [hydrocodone-homatropine] Nausea And Vomiting   Tolerates hydrocodone with acetaminophen      Medication List       Accurate as of January 26, 2021  3:57 PM. If you have any questions, ask your nurse  or doctor.        albuterol 108 (90 Base) MCG/ACT inhaler Commonly known as: VENTOLIN HFA Use 2 inhalations every 4-6 hours as needed for cough and wheezing.   azithromycin 250 MG tablet Commonly known as: ZITHROMAX Take 2 tablets by mouth on day 1, followed by 1 tablet by mouth daily for 4 days.   benzonatate 100 MG capsule Commonly known as: TESSALON TAKE 1 TO 2 CAPSULES BY MOUTH EVERY 8 hours prn cough   budesonide-formoterol 80-4.5 MCG/ACT inhaler Commonly known as: SYMBICORT Inhale 2 puffs into the lungs 2 (two) times daily.   doxycycline 100 MG tablet Commonly known as: VIBRA-TABS Take 1 tablet (100 mg total) by mouth 2 (two) times daily.   fluticasone 50 MCG/ACT nasal spray Commonly known as: FLONASE Place 2 sprays into both nostrils daily.   pantoprazole 40 MG tablet Commonly known as: Protonix Take 1 tablet (40 mg total) by mouth daily.   predniSONE 10 MG (21) Tbpk tablet Commonly known as: STERAPRED UNI-PAK 21 TAB Taper over 6 days.   promethazine-dextromethorphan 6.25-15 MG/5ML syrup Commonly known as: PROMETHAZINE-DM Take 5 mLs by mouth 4 (four) times daily as needed for cough.          Objective:   Physical Exam Pulse 90   Ht 6\' 1"  (1.854 m)   Wt 231 lb (104.8 kg)   SpO2 95%   BMI 30.48 kg/m  This is a virtual  video visit, alert oriented x3, in no respiratory distress, no cough or wheezing noted, he is scratching site few times during the visit and his eyes were noted to be watery.     Assessment      40 year old male, history of asthma, history of seasonal allergies, COVID infection 10-2020, not vaccinated.  Presents with:   Upper respiratory symptoms: As described above, suspect etiology is allergies ("this happened this time of the year on the clock"". Recommend to continue Claritin and Flonase.  Add Patanol, round of prednisone.  Call if not gradually better. At some point PCP may like to consider an allergist referral. Work note  provided Preventive care/patient location: Benefits of COVID vaccination discussed, he did not verbalize any response to my comments.       I discussed the assessment and treatment plan with the patient. The patient was provided an opportunity to ask questions and all were answered. The patient agreed with the plan and demonstrated an understanding of the instructions.   The patient was advised to call back or seek an in-person evaluation if the symptoms worsen or if the condition fails to improve as anticipated.

## 2021-01-27 ENCOUNTER — Other Ambulatory Visit: Payer: Self-pay | Admitting: Pulmonary Disease

## 2021-01-27 DIAGNOSIS — J4540 Moderate persistent asthma, uncomplicated: Secondary | ICD-10-CM

## 2021-02-15 ENCOUNTER — Ambulatory Visit: Payer: 59 | Admitting: Pulmonary Disease

## 2021-02-15 ENCOUNTER — Encounter: Payer: Self-pay | Admitting: Pulmonary Disease

## 2021-02-15 ENCOUNTER — Other Ambulatory Visit: Payer: Self-pay

## 2021-02-15 VITALS — BP 112/78 | HR 66 | Temp 97.7°F | Ht 72.0 in | Wt 230.0 lb

## 2021-02-15 DIAGNOSIS — J4540 Moderate persistent asthma, uncomplicated: Secondary | ICD-10-CM

## 2021-02-15 NOTE — Progress Notes (Signed)
[place]    [id number]    [birth year]  Primary Care Physician:Saguier, Iris Pert  Referring Physician: Mackie Pai, PA-C [address],  [place] [address]  Chief complaint:   Patient with a history of asthma  HPI:  No significant exacerbations recently  Occasional exacerbations usually resolve with increased use of his albuterol  Did have COVID in January of this year, did not lead to hospitalization or significant exacerbation of his asthma Asthma is stable with use of Symbicort   Asthma is lifelong Only recently started having more problems with it Does not recollect recent exposure or changes in his immediate environment No pets  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 albuterol (VENTOLIN HFA) 108 (90 Base) MCG/ACT inhaler Use 2 inhalations every 4-6 hours as needed for cough and wheezing.  . budesonide-formoterol (SYMBICORT) 80-4.5 MCG/ACT inhaler Inhale 2 puffs into the lungs 2 (two) times daily.  . fluticasone (FLONASE) 50 MCG/ACT nasal spray Place 2 sprays into both nostrils daily.  . pantoprazole (PROTONIX) 40 MG tablet Take 1 tablet (40 mg total) by mouth daily.  . promethazine-dextromethorphan (PROMETHAZINE-DM) 6.25-15 MG/5ML syrup Take 5 mLs by mouth 4 (four) times daily as needed for cough.  . predniSONE (DELTASONE) 10 MG tablet 4 tablets x 2 days, 3 tabs x 2 days, 2 tabs x 2 days, 1 tab x 2 days (Patient not taking: Reported on 02/15/2021)   No facility-administered encounter medications on file as of 02/15/2021.    Allergies as of 02/15/2021 - Review Complete 02/15/2021  Allergen Reaction Noted  . Montelukast Other (See Comments) 12/17/2019  . Dairy aid [lactase]   12/26/2014  . Hycodan [hydrocodone bit-homatrop mbr] Nausea And Vomiting 12/19/2011    Past Medical History:  Diagnosis Date  . Allergy   . Asthma   . Diarrhea 09/06/2019  . GERD (gastroesophageal reflux disease)   . Irregular heart beat    Per pt very now and then pt said he has an extra heart beat  . Vomiting 09/06/2019    Past Surgical History:  Procedure Laterality Date  . LAPAROSCOPIC APPENDECTOMY N/A 12/28/2014   Procedure: APPENDECTOMY LAPAROSCOPIC;  Surgeon: Armandina Gemma, MD;  Location: [facility];  Service: General;  Laterality: N/A;    Family History  Problem Relation Age of Onset  . Hypertension Mother   . Achalasia Mother   . Diabetes Father   . COPD Father   . Diabetes Maternal Grandmother   . Colon cancer Neg Hx   . Esophageal cancer Neg Hx   . Rectal cancer Neg Hx   . Stomach cancer Neg Hx     Social History   Socioeconomic History  . Marital status: Married    Spouse name: Janett Billow  . Number of children: 0  . Years of education: Not on file  . Highest education level: Not on file  Occupational History  . Occupation: Production assistant, radio  Tobacco Use  . Smoking status: Never Smoker  . Smokeless tobacco: Never Used  Vaping Use  . Vaping Use: Never used  Substance and Sexual Activity  . Alcohol use: Yes    Alcohol/week: 0.0 standard drinks    Comment: once a year  .  Drug use: No  . Sexual activity: Yes  Other Topics Concern  . Not on file  Social History Narrative  . Not on file   Social Determinants of Health   Financial Resource Strain: Not on file  Food Insecurity: Not on file  Transportation Needs: Not on file  Physical Activity: Not on file  Stress: Not on file  Social Connections: Not on file  Intimate Partner Violence: Not on file    Review of Systems  Constitutional: Negative.  Negative for fatigue.  HENT: Negative.   Eyes: Negative.   Respiratory: Positive for shortness of breath.   Cardiovascular: Negative.   Gastrointestinal:  Negative.   Genitourinary: Negative.   Musculoskeletal: Negative.     Vitals:   02/15/21 1421  BP: 112/78  Pulse: 66  Temp: 97.7 F (36.5 C)  SpO2: 98%     Physical Exam Constitutional:      Appearance: He is well-developed.  HENT:     Head: Normocephalic and atraumatic.  Eyes:     General:        Right eye: No discharge.        Left eye: No discharge.  Neck:     Thyroid: No thyromegaly.     Trachea: No tracheal deviation.  Cardiovascular:     Rate and Rhythm: Normal rate and regular rhythm.  Pulmonary:     Effort: Pulmonary effort is normal. No respiratory distress.     Breath sounds: Normal breath sounds. No wheezing or rales.  Chest:     Chest wall: No tenderness.  Musculoskeletal:     Cervical back: No rigidity or tenderness.  Neurological:     Mental Status: He is alert and oriented to person, place, and time.     Cranial Nerves: No cranial nerve deficit.    Data Reviewed: Recent chest x-ray performed 01/01/2020 reviewed by myself showing no acute infiltrate  Assessment:  .Moderate persistent asthma -Symptoms controlled with Symbicort -Infrequent use of albuterol  No other significant active medical condition  Plan/Recommendations: Continue Symbicort-Symbicort 80, 1 puff twice a day  Continue albuterol use as needed  I will see you back in about a year  Avoid any identifiable triggers   Sherrilyn Rist MD [facility] 02/15/2021, 2:32 PM  CC: Mackie Pai, PA-C

## 2021-02-15 NOTE — Patient Instructions (Signed)
  Asthma with well-controlled symptoms at present -Continue using Symbicort -Albuterol as needed  Call us with any significant concerns  Avoid identifiable triggers  I will see you back in a year

## 2021-02-16 ENCOUNTER — Other Ambulatory Visit: Payer: Self-pay | Admitting: Medical

## 2021-03-17 ENCOUNTER — Other Ambulatory Visit: Payer: Self-pay | Admitting: Medical

## 2021-03-24 ENCOUNTER — Other Ambulatory Visit: Payer: Self-pay

## 2021-03-24 ENCOUNTER — Ambulatory Visit: Payer: 59 | Admitting: Medical

## 2021-03-24 VITALS — BP 115/80 | HR 82 | Resp 16 | Ht 73.0 in | Wt 238.0 lb

## 2021-03-24 DIAGNOSIS — M25562 Pain in left knee: Secondary | ICD-10-CM

## 2021-03-24 MED ORDER — DICLOFENAC SODIUM 75 MG PO TBEC
75.0000 mg | DELAYED_RELEASE_TABLET | Freq: Two times a day (BID) | ORAL | 0 refills | Status: DC
Start: 2021-03-24 — End: 2021-12-09

## 2021-03-24 NOTE — Progress Notes (Signed)
Subjective:    Patient ID: Bobby Hobbs, male    DOB: Mar 27, 1981, 40 y.o.   MRN: [id number]  HPI  Pt in with left knee pain. Pain was present last year and referred to sports med. He has PT and pain did eventually subiside.   Pt states recently on Monday night. He was very active preceding week walked on vacation.   Pain last 2 days. Pt is using advil and pain mild decreased.   Today pain level 3-4/10. Monday 7-8 level pain.  Pain feels very similar to when he saw Dr. Kallie Locks.   Limited ultrasound: Left knee: 05/18/20   No effusion in the suprapatellar pouch. Chronic changes at the insertion of the patellar tendon related to [facility]. Normal medial joint space with outpouching of the medial meniscus. Normal-appearing lateral compartment.     Summary: Findings suggest degenerative change of the medial meniscus.     Review of Systems  Constitutional:  Negative for chills, fatigue and fever.  Respiratory:  Negative for cough, chest tightness, shortness of breath and wheezing.   Cardiovascular:  Negative for chest pain and palpitations.  Gastrointestinal:  Negative for abdominal pain.  Genitourinary:  Negative for dysuria.  Musculoskeletal:  Negative for back pain.       Left knee pain  Neurological:  Negative for dizziness and headaches.  Hematological:  Negative for adenopathy. Does not bruise/bleed easily.  Psychiatric/Behavioral:  Negative for behavioral problems and confusion.     Past Medical History:  Diagnosis Date   Allergy    Asthma    Diarrhea 09/06/2019   GERD (gastroesophageal reflux disease)    Irregular heart beat    Per pt very now and then pt said he has an extra heart beat   Vomiting 09/06/2019     Social History   Socioeconomic History   Marital status: Married    Spouse name: Janett Billow   Number of children: 0   Years of education: Not on file   Highest education level: Not on file  Occupational History   Occupation:  Production assistant, radio  Tobacco Use   Smoking status: Never   Smokeless tobacco: Never  Vaping Use   Vaping Use: Never used  Substance and Sexual Activity   Alcohol use: Yes    Alcohol/week: 0.0 standard drinks    Comment: once a year   Drug use: No   Sexual activity: Yes  Other Topics Concern   Not on file  Social History Narrative   Not on file   Social Determinants of Health   Financial Resource Strain: Not on file  Food Insecurity: Not on file  Transportation Needs: Not on file  Physical Activity: Not on file  Stress: Not on file  Social Connections: Not on file  Intimate Partner Violence: Not on file    Past Surgical History:  Procedure Laterality Date   LAPAROSCOPIC APPENDECTOMY N/A 12/28/2014   Procedure: APPENDECTOMY LAPAROSCOPIC;  Surgeon: Armandina Gemma, MD;  Location: [facility];  Service: General;  Laterality: N/A;    Family History  Problem Relation Age of Onset   Hypertension Mother    Achalasia Mother    Diabetes Father    COPD Father    Diabetes Maternal Grandmother    Colon cancer Neg Hx    Esophageal cancer Neg Hx    Rectal cancer Neg Hx    Stomach cancer Neg Hx     Allergies  Allergen Reactions   Montelukast Other (See Comments)    headache  Dairy Aid [Lactase]    Hycodan [Hydrocodone Bit-Homatrop Mbr] Nausea And Vomiting    Tolerates hydrocodone with acetaminophen    Current Outpatient Medications on File Prior to Visit  Medication Sig Dispense Refill   albuterol (VENTOLIN HFA) 108 (90 Base) MCG/ACT inhaler Use 2 inhalations every 4-6 hours as needed for cough and wheezing. 8 g 5   budesonide-formoterol (SYMBICORT) 80-4.5 MCG/ACT inhaler Inhale 2 puffs into the lungs 2 (two) times daily. 10.2 g 5   fluticasone (FLONASE) 50 MCG/ACT nasal spray Place 2 sprays into both nostrils daily. 16 mL 5   pantoprazole (PROTONIX) 40 MG tablet Take 1 tablet (40 mg total) by mouth daily. 30 tablet 11   predniSONE (DELTASONE) 10 MG tablet 4 tablets x 2 days, 3  tabs x 2 days, 2 tabs x 2 days, 1 tab x 2 days 20 tablet 0   No current facility-administered medications on file prior to visit.    BP 115/80   Pulse 82   Resp 16   Ht 6\' 1"  (1.854 m)   Wt 238 lb (108 kg)   SpO2 97%   BMI 31.40 kg/m       Objective:   Physical Exam  General- No acute distress. Pleasant patient. Neck- Full range of motion, no jvd Lungs- Clear, even and unlabored. Heart- regular rate and rhythm. Neurologic- CNII- XII grossly intact.   Left knee- no swelling or redness. Mild pain on palpation of joint line. No obvious crepitus.   Left calf- not swollen. Negative homans signs.         Assessment & Plan:   For recurrent knee pain will rx diclofenac nsaid to use twice daily. Not to use otc nsaids while on. Can continue knee brace.  If you knee pain persists past 7 days let me know and would refer back to Dr. Laiza Veenstra Jolly.  If you have any calf swelling or pain behind knee then would get [place] as discussed. [place] not indicated today by exam.  Follow up in 7-10 days or as needed

## 2021-03-24 NOTE — Patient Instructions (Signed)
For recurrent knee pain will rx diclofenac nsaid to use twice daily. Not to use otc nsaids while on. Can continue knee brace.  If you knee pain persists past 7 days let me know and would refer back to Dr. Gilmore Bobby Hobbs.  If you have any calf swelling or pain behind knee then would get [place] as discussed. [place] not indicated today by exam.  Follow up in 7-10 days or as needed

## 2021-04-26 ENCOUNTER — Other Ambulatory Visit: Payer: Self-pay | Admitting: Gastroenterology

## 2021-05-18 ENCOUNTER — Other Ambulatory Visit: Payer: Self-pay

## 2021-05-18 ENCOUNTER — Telehealth (INDEPENDENT_AMBULATORY_CARE_PROVIDER_SITE_OTHER): Payer: 59 | Admitting: Medical

## 2021-05-18 VITALS — BP 135/90 | Temp 98.3°F

## 2021-05-18 DIAGNOSIS — J0100 Acute maxillary sinusitis, unspecified: Secondary | ICD-10-CM

## 2021-05-18 DIAGNOSIS — J309 Allergic rhinitis, unspecified: Secondary | ICD-10-CM

## 2021-05-18 MED ORDER — AZITHROMYCIN 250 MG PO TABS
ORAL_TABLET | ORAL | 0 refills | Status: AC
Start: 2021-05-18 — End: 2021-05-23

## 2021-05-18 MED ORDER — LEVOCETIRIZINE DIHYDROCHLORIDE 5 MG PO TABS
5.0000 mg | ORAL_TABLET | Freq: Every evening | ORAL | 3 refills | Status: DC
Start: 2021-05-18 — End: 2021-07-19

## 2021-05-18 NOTE — Patient Instructions (Signed)
Allergic rhinitis and sinus infection following heavy exposure to dust.  Rx xyzal, take your flonase nasal spray and prescribing azithromycin antibiotic.  For recent wheezing use symbicort and albuterol if wheezing worsens would add taper prednisone.  If symptoms worsen or change advise otc rapid covid test.  Follow up 7 days or as needed

## 2021-05-18 NOTE — Progress Notes (Signed)
   Subjective:    Patient ID: [place], male    DOB: [birth year], 40 y.o.   MRN: [id number]  HPI  Virtual Visit via Video Note  I connected with [place] on 05/18/21 at 11:20 AM EDT by a video enabled telemedicine application and verified that I am speaking with the correct person using two identifiers.  Location: Patient: home [place]. Provider: office [place]   I discussed the limitations of evaluation and management by telemedicine and the availability of in person appointments. The patient expressed understanding and agreed to proceed.  History of Present Illness: Pt last wed cleaned out a shed. He states a lot of exposure to dust. He was coughing mildly when he was cleaning. Pt has hx of allergies summer and spring. Around nose and sinus has pressure. A lot pnd and some yellow mucus when blows nose.  No fever, no chills, no sweat or body aches.   He was cleaning out shed for 2 days. By 3rd day symptoms.  Pt never got vaccinate against covid. Hx of covid in January.   Cleaned out shed last wed.   Mild wheezing. He has symbicort and albuterol   Pt has claritin and benadryl.   Observations/Objective:  General-no acute distress, pleasant, oriented. Lungs- on inspection lungs appear unlabored. Neck- no tracheal deviation or jvd on inspection. Neuro- gross motor function appears intact.    Assessment and Plan: Allergic rhinitis and sinus infection following heavy exposure to dust.  Rx xyzal, take your flonase nasal spray and prescribing azithromycin antibiotic.  For recent wheezing use symbicort and albuterol if wheezing worsens would add taper prednisone.  If symptoms worsen or change advise otc rapid covid test.  Follow up 7 days or as needed  Follow Up Instructions:    I discussed the assessment and treatment plan with the patient. The patient was provided an opportunity to ask questions and all were answered. The patient agreed with the plan  and demonstrated an understanding of the instructions.   The patient was advised to call back or seek an in-person evaluation if the symptoms worsen or if the condition fails to improve as anticipated.  Time spent was 25 minutes which consisted of chart review, discussing diagnosis, work up treatment and documentation.    Mackie Pai, PA-C   Review of Systems     Objective:   Physical Exam        Assessment & Plan:

## 2021-07-19 ENCOUNTER — Telehealth: Payer: Self-pay | Admitting: Medical

## 2021-07-19 ENCOUNTER — Ambulatory Visit: Payer: 59 | Admitting: Internal Medicine

## 2021-07-19 ENCOUNTER — Other Ambulatory Visit: Payer: Self-pay

## 2021-07-19 ENCOUNTER — Encounter: Payer: Self-pay | Admitting: Internal Medicine

## 2021-07-19 VITALS — BP 122/68 | HR 81 | Temp 97.7°F | Resp 18 | Ht 73.0 in | Wt 233.1 lb

## 2021-07-19 DIAGNOSIS — J019 Acute sinusitis, unspecified: Secondary | ICD-10-CM

## 2021-07-19 MED ORDER — PREDNISONE 10 MG PO TABS: ORAL_TABLET | ORAL | 0 refills | Status: DC

## 2021-07-19 MED ORDER — AMOXICILLIN 875 MG PO TABS
875.0000 mg | ORAL_TABLET | Freq: Two times a day (BID) | ORAL | 0 refills | Status: DC
Start: 2021-07-19 — End: 2022-03-04

## 2021-07-19 NOTE — Patient Instructions (Signed)
Continue Symbicort twice a day  Use albuterol as needed for cough and wheezing as you are doing  Take amoxicillin for 1 week  Take prednisone for few days  Call if not gradually better

## 2021-07-19 NOTE — Telephone Encounter (Signed)
Pt has an appt with Dr.Paz today

## 2021-07-19 NOTE — Telephone Encounter (Signed)
Pt. Called in stating he was having issues with sinus pressure and issues with his breathing. Transferred pt. To triage to receive further instructions.

## 2021-07-19 NOTE — Progress Notes (Signed)
Subjective:    Patient ID: [place], male    DOB: [birth year], 40 y.o.   MRN: [id number]  DOS:  07/19/2021 Type of visit - description: Acute  Symptoms a started approximately a week ago: Bilateral sinus pressure, postnasal dripping. The  PND seems to be abundant and  causing some nausea. Has developed some chest congestion but no pain.  Some shortness of breath. Also some wheezing the last few days, mostly in the morning. Had fever, low-grade yesterday for the first time. Had 2 COVID tests last week and yesterday, both negative. Thinks he has sinusitis  Review of Systems See above   Past Medical History:  Diagnosis Date   Allergy    Asthma    Diarrhea 09/06/2019   GERD (gastroesophageal reflux disease)    Irregular heart beat    Per pt very now and then pt said he has an extra heart beat   Vomiting 09/06/2019    Past Surgical History:  Procedure Laterality Date   LAPAROSCOPIC APPENDECTOMY N/A 12/28/2014   Procedure: APPENDECTOMY LAPAROSCOPIC;  Surgeon: Armandina Gemma, MD;  Location: [facility];  Service: General;  Laterality: N/A;    Allergies as of 07/19/2021       Reactions   Montelukast Other (See Comments)   headache   Dairy Aid [lactase]    Hycodan [hydrocodone Bit-homatrop Mbr] Nausea And Vomiting   Tolerates hydrocodone with acetaminophen        Medication List        Accurate as of July 19, 2021  1:39 PM. If you have any questions, ask your nurse or doctor.          albuterol 108 (90 Base) MCG/ACT inhaler Commonly known as: VENTOLIN HFA Use 2 inhalations every 4-6 hours as needed for cough and wheezing.   budesonide-formoterol 80-4.5 MCG/ACT inhaler Commonly known as: SYMBICORT Inhale 2 puffs into the lungs 2 (two) times daily.   diclofenac 75 MG EC tablet Commonly known as: VOLTAREN Take 1 tablet (75 mg total) by mouth 2 (two) times daily.   fluticasone 50 MCG/ACT nasal spray Commonly known as: FLONASE Place 2 sprays into  both nostrils daily.   levocetirizine 5 MG tablet Commonly known as: XYZAL Take 1 tablet (5 mg total) by mouth every evening.   pantoprazole 40 MG tablet Commonly known as: PROTONIX Take 1 tablet (40 mg total) by mouth daily.   predniSONE 10 MG tablet Commonly known as: DELTASONE 4 tablets x 2 days, 3 tabs x 2 days, 2 tabs x 2 days, 1 tab x 2 days           Objective:   Physical Exam BP 122/68 (BP Location: Left Arm, Patient Position: Sitting, Cuff Size: Normal)   Pulse 81   Temp 97.7 F (36.5 C) (Oral)   Resp 18   Ht 6\' 1"  (1.854 m)   Wt 233 lb 2 oz (105.7 kg)   SpO2 98%   BMI 30.76 kg/m  General:   Well developed, NAD, BMI noted. HEENT:  Normocephalic . Face symmetric, atraumatic. Nose: Slightly congested Sinuses: TTP bilaterally. Throat: Symmetric, no red Lungs:  CTA B Normal respiratory effort, no intercostal retractions, no accessory muscle use. Heart: RRR,  no murmur.  Lower extremities: no pretibial edema bilaterally  Skin: Not pale. Not jaundice Neurologic:  alert & oriented X3.  Speech normal, gait appropriate for age and unassisted Psych--  Cognition and judgment appear intact.  Cooperative with normal attention span and concentration.  Behavior appropriate. No  anxious or depressed appearing.      Assessment     40 year old gentleman, PMH includes chronic sinusitis, asthma, COVID infection 10-2020, not vaccinated.  Presents with: Mild sinusitis: Symptoms are described above, will treat with amoxicillin, prednisone. Asthma: Slightly exacerbated lately, rec consistent use of Symbicort and albuterol as needed. Vaccines: Has consistently declined any vaccines.  Has not changed his mind .  This visit occurred during the SARS-CoV-2 public health emergency.  Safety protocols were in place, including screening questions prior to the visit, additional usage of staff PPE, and extensive cleaning of exam room while observing appropriate contact time as  indicated for disinfecting solutions.

## 2021-08-24 ENCOUNTER — Other Ambulatory Visit: Payer: Self-pay | Admitting: Gastroenterology

## 2021-09-16 ENCOUNTER — Other Ambulatory Visit: Payer: Self-pay

## 2021-09-16 ENCOUNTER — Telehealth: Payer: 59 | Admitting: Medical

## 2021-09-16 ENCOUNTER — Encounter: Payer: Self-pay | Admitting: Medical

## 2021-09-16 ENCOUNTER — Ambulatory Visit (HOSPITAL_BASED_OUTPATIENT_CLINIC_OR_DEPARTMENT_OTHER)
Admission: RE | Admit: 2021-09-16 | Discharge: 2021-09-16 | Disposition: A | Discharge status: Home / Self Care | Payer: 59 | Source: Ambulatory Visit | Attending: Medical | Admitting: Medical

## 2021-09-16 VITALS — HR 67

## 2021-09-16 DIAGNOSIS — R197 Diarrhea, unspecified: Secondary | ICD-10-CM | POA: Diagnosis not present

## 2021-09-16 DIAGNOSIS — M79645 Pain in left finger(s): Secondary | ICD-10-CM

## 2021-09-16 MED ORDER — ONDANSETRON HCL 4 MG PO TABS: 4.0000 mg | ORAL_TABLET | Freq: Three times a day (TID) | ORAL | 0 refills | Status: DC | PRN

## 2021-09-16 NOTE — Progress Notes (Signed)
Subjective:    Patient ID: [place], male    DOB: 1981-06-30, 40 y.o.   MRN: [id number]  HPI Virtual Visit via Video Note  I connected with [place] on 09/16/21 at  9:00 AM EST by a video enabled telemedicine application and verified that I am speaking with the correct person using two identifiers.  Location: Patient: home Provider: home   I discussed the limitations of evaluation and management by telemedicine and the availability of in person appointments. The patient expressed understanding and agreed to proceed.  History of Present Illness: Pt states 3 day of loose stools/diarrhea. Pt had 5 loose stools today. Today 2 loose stools since 3 am. On 1st day 3-4 loose stools. Mild nausea. Stats watery stools. Pt had no recent antibiotic use. Pt states 2 of his coworkers have GI symptoms as well.  Pt has tried pepto but no immodium.   Pt suffered left 5th digit injury last wednesday. Pt accidentally slammed him finger into bolt release area in rifle. Since then tip of finger tingling since and number. At the time had severe pain.  Pt has slight mallet deformity left pinkey finger for years. So no acute injury.    Observations/Objective:  General-no acute distress, pleasant, oriented. Lungs- on inspection lungs appear unlabored. Neck- no tracheal deviation or jvd on inspection. Neuro- gross motor function appears intact.  Left 5th digit- small bruising at tip of nail, bruising at tip of finger/pad. Can flex extend finger but has slight old mallet deformity.  Assessment and Plan:  Patient Instructions  Diarrhea/loose stools for 3 days.  To coworkers with similar signs and symptoms.  Considering probable viral gastroenteritis versus possible bacterial cause.  Counseled to rest, hydrate adequately and eat bland foods.  Explained benefit of getting a stool culture, O&P and C. difficile test.  Order placed today and asked to collect today or tomorrow  morning.  I turned in the studies as soon as possible.   If diarrhea worsening pending stool test results please notify us.  Also recent left pinky finger injury.  Some tingling and slight numbness since injury.  Small subungual hematoma present with bruising at the tip.  Will get x-ray today to evaluate if suffered fracture.  Notes he had a slight mallet finger deformity but he states that has been present for years.  Follow-up in 1 week or sooner if needed.   Mackie Pai, PA-C   Time spent with patient today was 30 minutes which consisted of chart revdiew, discussing diagnosis, work up treatment and documentation.  Follow Up Instructions:    I discussed the assessment and treatment plan with the patient. The patient was provided an opportunity to ask questions and all were answered. The patient agreed with the plan and demonstrated an understanding of the instructions.   The patient was advised to call back or seek an in-person evaluation if the symptoms worsen or if the condition fails to improve as anticipated.     Mackie Pai, PA-C    Review of Systems  Constitutional:  Negative for chills, fatigue and fever.  HENT:  Negative for congestion.   Respiratory:  Negative for cough, chest tightness, shortness of breath and wheezing.   Cardiovascular:  Negative for chest pain.  Gastrointestinal:  Positive for diarrhea and nausea. Negative for abdominal distention, abdominal pain, blood in stool, constipation and vomiting.  Musculoskeletal:  Negative for back pain and myalgias.       FINGER INJURY.  Hematological:  Negative  for adenopathy. Does not bruise/bleed easily.  Psychiatric/Behavioral:  Negative for behavioral problems and confusion.       Objective:   Physical Exam        Assessment & Plan:

## 2021-09-16 NOTE — Patient Instructions (Addendum)
Diarrhea/loose stools for 3 days.  To coworkers with similar signs and symptoms.  Considering probable viral gastroenteritis versus possible bacterial cause.  Counseled to rest, hydrate adequately and eat bland foods.  Explained benefit of getting a stool culture, O&P and C. difficile test.  Order placed today and asked to collect today or tomorrow morning.  I turned in the studies as soon as possible.   Explain can start Imodium after he collect for stool studies.  Also will prescribe Zofran for nausea.  If diarrhea worsening pending stool test results please notify us.  Also recent left pinky finger injury.  Some tingling and slight numbness since injury.  Small subungual hematoma present with bruising at the tip.  Will get x-ray today to evaluate if suffered fracture.  Notes he had a slight mallet finger deformity but he states that has been present for years.  Follow-up in 1 week or sooner if needed.

## 2021-09-17 ENCOUNTER — Other Ambulatory Visit (INDEPENDENT_AMBULATORY_CARE_PROVIDER_SITE_OTHER): Payer: 59

## 2021-09-17 DIAGNOSIS — R197 Diarrhea, unspecified: Secondary | ICD-10-CM | POA: Diagnosis not present

## 2021-09-21 LAB — STOOL CULTURE: E coli, Shiga toxin Assay: NEGATIVE

## 2021-09-23 LAB — OVA AND PARASITE EXAMINATION
CONCENTRATE RESULT:: NONE SEEN
MICRO NUMBER:: 12737647
SPECIMEN QUALITY:: ADEQUATE
TRICHROME RESULT:: NONE SEEN

## 2021-09-29 LAB — CLOSTRIDIUM DIFFICILE BY PCR

## 2021-09-29 LAB — SPECIMEN STATUS REPORT

## 2021-10-28 ENCOUNTER — Ambulatory Visit: Payer: 59 | Admitting: Medical

## 2021-10-28 VITALS — BP 108/80 | HR 83 | Temp 97.9°F | Resp 18 | Ht 73.0 in | Wt 230.4 lb

## 2021-10-28 DIAGNOSIS — J029 Acute pharyngitis, unspecified: Secondary | ICD-10-CM | POA: Diagnosis not present

## 2021-10-28 DIAGNOSIS — R0981 Nasal congestion: Secondary | ICD-10-CM | POA: Diagnosis not present

## 2021-10-28 DIAGNOSIS — R051 Acute cough: Secondary | ICD-10-CM

## 2021-10-28 LAB — POCT RAPID STREP A (OFFICE): Rapid Strep A Screen: NEGATIVE

## 2021-10-28 LAB — POCT INFLUENZA A/B
Influenza A, POC: NEGATIVE
Influenza B, POC: NEGATIVE

## 2021-10-28 MED ORDER — BENZONATATE 100 MG PO CAPS: 100.0000 mg | ORAL_CAPSULE | Freq: Three times a day (TID) | ORAL | 0 refills | Status: DC | PRN

## 2021-10-28 MED ORDER — FLUTICASONE PROPIONATE 50 MCG/ACT NA SUSP: 2.0000 | Freq: Every day | NASAL | 1 refills | Status: DC

## 2021-10-28 MED ORDER — AZITHROMYCIN 250 MG PO TABS: ORAL_TABLET | ORAL | 0 refills | Status: AC

## 2021-10-28 NOTE — Progress Notes (Signed)
Subjective:    Patient ID: [place], male    DOB: 10/24/1980, 41 y.o.   MRN: [id number]  HPI Pt states last night woke up felt bad. He states had extreme nasal congestion, pnd and some nausea. He states nausea went away quickly. Pt does have sore throat. Mild low grade fever. Mild cough.  Pt took covid test last night.   Pt had covid before last year. Not vaccinated   Pt states prior 2 nights only 6 hours total since his dog was sick. Dog ate whole bottle of advil.   Review of Systems  Constitutional:  Negative for chills, diaphoresis, fatigue and fever.  HENT:  Positive for congestion and sore throat. Negative for ear pain, hearing loss and postnasal drip.   Respiratory:  Positive for cough. Negative for chest tightness, shortness of breath and wheezing.   Cardiovascular:  Negative for chest pain and palpitations.  Gastrointestinal:  Negative for abdominal pain, blood in stool and constipation.  Genitourinary:  Negative for dysuria, flank pain and frequency.  Musculoskeletal:  Negative for back pain and gait problem.  Neurological:  Negative for dizziness, syncope, numbness and headaches.  Hematological:  Negative for adenopathy. Does not bruise/bleed easily.  Psychiatric/Behavioral:  Negative for behavioral problems and confusion.     Past Medical History:  Diagnosis Date   Allergy    Asthma    Diarrhea 09/06/2019   GERD (gastroesophageal reflux disease)    Irregular heart beat    Per pt very now and then pt said he has an extra heart beat   Vomiting 09/06/2019     Social History   Socioeconomic History   Marital status: Married    Spouse name: Janett Billow   Number of children: 0   Years of education: Not on file   Highest education level: Not on file  Occupational History   Occupation: Production assistant, radio  Tobacco Use   Smoking status: Never   Smokeless tobacco: Never  Vaping Use   Vaping Use: Never used  Substance and Sexual Activity   Alcohol  use: Yes    Alcohol/week: 0.0 standard drinks    Comment: once a year   Drug use: No   Sexual activity: Yes  Other Topics Concern   Not on file  Social History Narrative   Not on file   Social Determinants of Health   Financial Resource Strain: Not on file  Food Insecurity: Not on file  Transportation Needs: Not on file  Physical Activity: Not on file  Stress: Not on file  Social Connections: Not on file  Intimate Partner Violence: Not on file    Past Surgical History:  Procedure Laterality Date   LAPAROSCOPIC APPENDECTOMY N/A 12/28/2014   Procedure: APPENDECTOMY LAPAROSCOPIC;  Surgeon: Armandina Gemma, MD;  Location: [facility];  Service: General;  Laterality: N/A;    Family History  Problem Relation Age of Onset   Hypertension Mother    Achalasia Mother    Diabetes Father    COPD Father    Diabetes Maternal Grandmother    Colon cancer Neg Hx    Esophageal cancer Neg Hx    Rectal cancer Neg Hx    Stomach cancer Neg Hx     Allergies  Allergen Reactions   Montelukast Other (See Comments)    headache   Dairy Aid [Tilactase]    Hycodan [Hydrocodone Bit-Homatrop Mbr] Nausea And Vomiting    Tolerates hydrocodone with acetaminophen    Current Outpatient Medications on File Prior to Visit  Medication Sig Dispense Refill   albuterol (VENTOLIN HFA) 108 (90 Base) MCG/ACT inhaler Use 2 inhalations every 4-6 hours as needed for cough and wheezing. 8 g 5   budesonide-formoterol (SYMBICORT) 80-4.5 MCG/ACT inhaler Inhale 2 puffs into the lungs 2 (two) times daily. 10.2 g 5   diclofenac (VOLTAREN) 75 MG EC tablet Take 1 tablet (75 mg total) by mouth 2 (two) times daily. 20 tablet 0   fluticasone (FLONASE) 50 MCG/ACT nasal spray Place 2 sprays into both nostrils daily. 16 mL 5   loratadine (CLARITIN) 10 MG tablet Take 10 mg by mouth daily.     ondansetron (ZOFRAN) 4 MG tablet Take 1 tablet (4 mg total) by mouth every 8 (eight) hours as needed for nausea or vomiting. 20 tablet 0    pantoprazole (PROTONIX) 40 MG tablet TAKE 1 TABLET BY MOUTH EVERY DAY 30 tablet 2   amoxicillin (AMOXIL) 875 MG tablet Take 1 tablet (875 mg total) by mouth 2 (two) times daily. 14 tablet 0   predniSONE (DELTASONE) 10 MG tablet 3 tabs x 3 days, 2 tabs x 3 days, 1 tab x 3 days 18 tablet 0   No current facility-administered medications on file prior to visit.    BP 108/80    Pulse 83    Temp 97.9 F (36.6 C)    Resp 18    Ht 6\' 1"  (1.854 m)    Wt 230 lb 6.4 oz (104.5 kg)    SpO2 98%    BMI 30.40 kg/m       Objective:   Physical Exam  General Mental Status- Alert. General Appearance- Not in acute distress.   Skin General: Color- Normal Color. Moisture- Normal Moisture.  Neck Carotid Arteries- Normal color. Moisture- Normal Moisture. No carotid bruits. No JVD.  Chest and Lung Exam Auscultation: Breath Sounds:-Normal.  Cardiovascular Auscultation:Rythm- Regular. Murmurs & Other Heart Sounds:Auscultation of the heart reveals- No Murmurs.  Abdomen Inspection:-Inspeection Normal. Palpation/Percussion:Note:No mass. Palpation and Percussion of the abdomen reveal- Non Tender, Non Distended + BS, no rebound or guarding.  Neurologic Cranial Nerve exam:- CN III-XII intact(No nystagmus), symmetric smile. Strength:- 5/5 equal and symmetric strength both upper and lower extremities.   Heent- moderate tonsil hypetrophy and redneess. Mild pnd.    Assessment & Plan:   Patient Instructions  Early onset of illness with nasal congestion, mild cough sore throat and minimal nausea.  Could be early viral syndrome.  COVID test was negative yesterday and flu test negative today.  Symptoms persisting recommend recheck COVID test tomorrow or Saturday morning.  Presently recommend rest, hydrate and Tylenol for fever.  Will prescribe Flonase for nasal congestion and benzonatate for cough.  Your rapid strep test was negative.  Your throat exam does show some mild tonsillar hypertrophy with mild  redness.  Will make azithromycin antibiotic available if your throat were to worsen pending send out throat culture result which probably will be back over the weekend.  Follow-up in 7 days or sooner if needed.   Mackie Pai, PA-C   Mackie Pai, PA-C   Time spent with patient today was  14 minutes which consisted of chart revdiew, discussing diagnosis, work up treatment and documentation.

## 2021-10-28 NOTE — Patient Instructions (Signed)
Early onset of illness with nasal congestion, mild cough sore throat and minimal nausea.  Could be early viral syndrome.  COVID test was negative yesterday and flu test negative today.  Symptoms persisting recommend recheck COVID test tomorrow or Saturday morning.  Presently recommend rest, hydrate and Tylenol for fever.  Will prescribe Flonase for nasal congestion and benzonatate for cough.  Your rapid strep test was negative.  Your throat exam does show some mild tonsillar hypertrophy with mild redness.  Will make azithromycin antibiotic available if your throat were to worsen pending send out throat culture result which probably will be back over the weekend.  Follow-up in 7 days or sooner if needed.

## 2021-10-30 LAB — CULTURE, GROUP A STREP
MICRO NUMBER:: 12892393
SPECIMEN QUALITY:: ADEQUATE

## 2021-11-23 ENCOUNTER — Other Ambulatory Visit: Payer: Self-pay | Admitting: Gastroenterology

## 2021-12-09 ENCOUNTER — Ambulatory Visit: Payer: 59 | Admitting: Medical

## 2021-12-09 VITALS — BP 107/64 | HR 70 | Resp 18 | Ht 73.0 in | Wt 232.0 lb

## 2021-12-09 DIAGNOSIS — M25562 Pain in left knee: Secondary | ICD-10-CM

## 2021-12-09 DIAGNOSIS — K219 Gastro-esophageal reflux disease without esophagitis: Secondary | ICD-10-CM

## 2021-12-09 DIAGNOSIS — J309 Allergic rhinitis, unspecified: Secondary | ICD-10-CM

## 2021-12-09 MED ORDER — KETOROLAC TROMETHAMINE 60 MG/2ML IM SOLN
60.0000 mg | Freq: Once | INTRAMUSCULAR | Status: AC
  Administered 2021-12-09: 60 mg via INTRAMUSCULAR

## 2021-12-09 MED ORDER — MELOXICAM 7.5 MG PO TABS: 7.5000 mg | ORAL_TABLET | Freq: Every day | ORAL | 0 refills | Status: DC

## 2021-12-09 NOTE — Progress Notes (Signed)
? ? ? ? ? ? ? ? ? ? ? ? ? ? ? ? ? ? ? ? ? ? ? ? ? ? ? ? ?Subjective:  ? ? Patient ID: Bobby Hobbs, male    DOB: [birth year], 41 y.o.   MRN: [id number] ? ?HPI ? ?Pt states left knee pain that started over this past weekend. Sunday pain became severe. Pt works in jail and Haematologist. He walks 10,000-13000 steps regularly. He has to walk steps. Weekend pain started he thinks one day got 30,000 steps. ? ?Pt had knee pain last summer. Xray was normal. US showed.  ? ?Summary: Findings suggest degenerative change of the medial meniscus. ? ?When saw sport med MD he went to PT and did exercises.  ? ?Tried advil this weekend and took Hobbs off pain ? ?Allergies- well controlled with claritin and flonase.  ? ?Jerrye Bushy- controlled with protonix ? ? ?Review of Systems  ?Constitutional:  Negative for chills, fatigue and fever.  ?HENT:  Negative for congestion, ear pain and mouth sores.   ?Respiratory:  Negative for cough, chest tightness, shortness of breath and stridor.   ?Cardiovascular:  Negative for chest pain and palpitations.  ?Gastrointestinal:  Negative for abdominal pain.  ?Genitourinary:  Negative for dysuria and flank pain.  ?Musculoskeletal:  Negative for back pain.  ?     Left knee pain  ? ? ?Past Medical History:  ?Diagnosis Date  ? Allergy   ? Asthma   ? Diarrhea 09/06/2019  ? GERD (gastroesophageal reflux disease)   ? Irregular heart beat   ? Per pt very now and then pt said he has an extra heart beat  ? Vomiting 09/06/2019  ? ?  ?Social History  ? ?Socioeconomic History  ? Marital status: Married  ?  Spouse name: Janett Billow  ? Number of children: 0  ? Years of education: Not on file  ? Highest education level: Not on file  ?Occupational History  ? Occupation: Production assistant, radio  ?Tobacco Use  ? Smoking status: Never  ? Smokeless tobacco: Never  ?Vaping Use  ? Vaping Use: Never used  ?Substance and Sexual Activity  ? Alcohol use: Yes  ?  Alcohol/week: 0.0 standard drinks  ?  Comment: once a year  ? Drug  use: No  ? Sexual activity: Yes  ?Other Topics Concern  ? Not on file  ?Social History Narrative  ? Not on file  ? ?Social Determinants of Health  ? ?Financial Resource Strain: Not on file  ?Food Insecurity: Not on file  ?Transportation Needs: Not on file  ?Physical Activity: Not on file  ?Stress: Not on file  ?Social Connections: Not on file  ?Intimate Partner Violence: Not on file  ? ? ?Past Surgical History:  ?Procedure Laterality Date  ? LAPAROSCOPIC APPENDECTOMY N/A 12/28/2014  ? Procedure: APPENDECTOMY LAPAROSCOPIC;  Surgeon: Armandina Gemma, MD;  Location: [facility];  Service: General;  Laterality: N/A;  ? ? ?Family History  ?Problem Relation Age of Onset  ? Hypertension Mother   ? Achalasia Mother   ? Diabetes Father   ? COPD Father   ? Diabetes Maternal Grandmother   ? Colon cancer Neg Hx   ? Esophageal cancer Neg Hx   ? Rectal cancer Neg Hx   ? Stomach cancer Neg Hx   ? ? ?Allergies  ?Allergen Reactions  ? Montelukast Other (See Comments)  ?  headache  ? Dairy Aid [Tilactase]   ? Hycodan [Hydrocodone Bit-Homatrop Mbr] Nausea And Vomiting  ?  Tolerates hydrocodone with acetaminophen  ? ? ?Current Outpatient Medications on File Prior to Visit  ?Medication Sig Dispense Refill  ? albuterol (VENTOLIN HFA) 108 (90 Base) MCG/ACT inhaler Use 2 inhalations every 4-6 hours as needed for cough and wheezing. 8 g 5  ? amoxicillin (AMOXIL) 875 MG tablet Take 1 tablet (875 mg total) by mouth 2 (two) times daily. 14 tablet 0  ? budesonide-formoterol (SYMBICORT) 80-4.5 MCG/ACT inhaler Inhale 2 puffs into the lungs 2 (two) times daily. 10.2 g 5  ? diclofenac (VOLTAREN) 75 MG EC tablet Take 1 tablet (75 mg total) by mouth 2 (two) times daily. 20 tablet 0  ? fluticasone (FLONASE) 50 MCG/ACT nasal spray Place 2 sprays into both nostrils daily. 16 mL 5  ? loratadine (CLARITIN) 10 MG tablet Take 10 mg by mouth daily.    ? pantoprazole (PROTONIX) 40 MG tablet TAKE 1 TABLET BY MOUTH EVERY DAY 30 tablet 2  ? predniSONE (DELTASONE) 10 MG  tablet 3 tabs x 3 days, 2 tabs x 3 days, 1 tab x 3 days 18 tablet 0  ? ?No current facility-administered medications on file prior to visit.  ? ? ?BP 107/64   Pulse 70   Resp 18   Ht 6\' 1"  (1.854 m)   Wt 232 lb (105.2 kg)   SpO2 97%   BMI 30.61 kg/m?  ?  ?   ?Objective:  ? Physical Exam ? ?General- No acute distress. Pleasant patient. ?Neck- Full range of motion, no jvd ?Lungs- Clear, even and unlabored. ?Heart- regular rate and rhythm. ?Neurologic- CNII- XII grossly intact.  ? ?Left knee- mild pain on palpation medial and lateral aspect. Mild- moderate pain on flexion and extension. No obvious crepitus. ? ? ?   ?Assessment & Plan:  ? ?Patient Instructions  ?Left knee pain.  Prior US suggest degenerative change of the medial meniscus per sport med MD. Odette Horns was negative. ? ?Will give toradol 60 mg im today. Not to use ibuprofen or other nsaid rest of today. Can start low dose meloxicam tomorrow am. ? ?2 day work excuse.  ? ?Allergies well controlled with claritin and flonase. ? ?Gerd controlled with protonix. ? ?Refer back to Sport med MD due to recurrent moderate to high level pain.  ? ?  ?Mackie Pai, PA-C  ?

## 2021-12-09 NOTE — Patient Instructions (Addendum)
Left knee pain.  Prior US suggest degenerative change of the medial meniscus per sport med MD. Bobby Hobbs was negative. ? ?Will give toradol 60 mg im today. Not to use ibuprofen or other nsaid rest of today. Can start low dose meloxicam tomorrow am. ? ?2 day work excuse.  ? ?Allergies well controlled with claritin and flonase. ? ?Gerd controlled with protonix. ? ?Refer back to Sport med MD due to recurrent moderate to high level pain.  ? ? ?

## 2022-01-18 ENCOUNTER — Other Ambulatory Visit: Payer: Self-pay | Admitting: Pulmonary Disease

## 2022-01-18 DIAGNOSIS — J4540 Moderate persistent asthma, uncomplicated: Secondary | ICD-10-CM

## 2022-02-15 ENCOUNTER — Other Ambulatory Visit: Payer: Self-pay | Admitting: Pulmonary Disease

## 2022-02-15 DIAGNOSIS — J4540 Moderate persistent asthma, uncomplicated: Secondary | ICD-10-CM

## 2022-03-04 ENCOUNTER — Encounter: Payer: Self-pay | Admitting: Family Medicine

## 2022-03-04 ENCOUNTER — Ambulatory Visit: Payer: 59 | Admitting: Family Medicine

## 2022-03-04 VITALS — BP 110/78 | HR 73 | Temp 97.8°F | Ht 73.0 in | Wt 227.0 lb

## 2022-03-04 DIAGNOSIS — J0101 Acute recurrent maxillary sinusitis: Secondary | ICD-10-CM | POA: Diagnosis not present

## 2022-03-04 MED ORDER — AMOXICILLIN-POT CLAVULANATE 875-125 MG PO TABS: 1.0000 | ORAL_TABLET | Freq: Two times a day (BID) | ORAL | 0 refills | Status: AC

## 2022-03-04 MED ORDER — PREDNISONE 20 MG PO TABS: 40.0000 mg | ORAL_TABLET | Freq: Every day | ORAL | 0 refills | Status: AC

## 2022-03-04 NOTE — Progress Notes (Signed)
Chief Complaint  Patient presents with   Sinusitis    Facial pressure, headache,sore throat and sinus congestion.     [place] here for URI complaints.  Duration: 7 days  Associated symptoms: sinus headache, sinus congestion, sinus pain, rhinorrhea, itchy watery eyes, sore throat, and scant cough Denies: ear fullness, ear pain, ear drainage, wheezing, shortness of breath, myalgia, and fevers, dental pain Treatment to date: INCS Sick contacts: No  Past Medical History:  Diagnosis Date   Allergy    Asthma    Diarrhea 09/06/2019   GERD (gastroesophageal reflux disease)    Irregular heart beat    Per pt very now and then pt said he has an extra heart beat   Vomiting 09/06/2019    Objective BP 110/78   Pulse 73   Temp 97.8 F (36.6 C) (Oral)   Ht '6\' 1"'$  (1.854 m)   Wt 227 lb (103 kg)   SpO2 98%   BMI 29.95 kg/m  General: Awake, alert, appears stated age HEENT: AT, [place], ears patent b/l and TM's neg, nares patent w/o discharge, pharynx pink and without exudates, ttp over max sinuses b/l, MMM Neck: No masses or asymmetry Heart: RRR Lungs: CTAB, no accessory muscle use Psych: Age appropriate judgment and insight, normal mood and affect  Acute recurrent maxillary sinusitis - Plan: amoxicillin-clavulanate (AUGMENTIN) 875-125 MG tablet, predniSONE (DELTASONE) 20 MG tablet  5 d pred burst 40 mg/d. If no improvement in next 3 d, will take Augmentin.  Continue to push fluids, practice good hand hygiene, cover mouth when coughing. F/u prn. If starting to experience fevers, shaking, or shortness of breath, seek immediate care. Pt voiced understanding and agreement to the plan.  [place], DO 03/04/22 3:43 PM

## 2022-03-04 NOTE — Patient Instructions (Signed)
Continue to push fluids, practice good hand hygiene, and cover your mouth if you cough. ? ?If you start having fevers, shaking or shortness of breath, seek immediate care. ? ?OK to take Tylenol 1000 mg (2 extra strength tabs) or 975 mg (3 regular strength tabs) every 6 hours as needed. ? ?Let us know if you need anything. ?

## 2022-03-21 ENCOUNTER — Other Ambulatory Visit: Payer: Self-pay | Admitting: Pulmonary Disease

## 2022-03-21 DIAGNOSIS — J4540 Moderate persistent asthma, uncomplicated: Secondary | ICD-10-CM

## 2022-04-20 ENCOUNTER — Other Ambulatory Visit: Payer: Self-pay | Admitting: Pulmonary Disease

## 2022-04-20 DIAGNOSIS — J4540 Moderate persistent asthma, uncomplicated: Secondary | ICD-10-CM

## 2022-04-28 ENCOUNTER — Ambulatory Visit: Payer: 59 | Admitting: Family

## 2022-04-28 ENCOUNTER — Other Ambulatory Visit: Payer: Self-pay | Admitting: Family

## 2022-04-28 ENCOUNTER — Encounter: Payer: Self-pay | Admitting: Family

## 2022-04-28 VITALS — BP 118/84 | HR 84 | Temp 98.3°F | Resp 18 | Ht 73.0 in | Wt 236.4 lb

## 2022-04-28 DIAGNOSIS — J0101 Acute recurrent maxillary sinusitis: Secondary | ICD-10-CM | POA: Diagnosis not present

## 2022-04-28 MED ORDER — DOXYCYCLINE HYCLATE 100 MG PO TABS: 100.0000 mg | ORAL_TABLET | Freq: Two times a day (BID) | ORAL | 0 refills | Status: DC

## 2022-04-28 MED ORDER — PREDNISONE 20 MG PO TABS
ORAL_TABLET | ORAL | 0 refills | Status: DC
Start: 2022-04-28 — End: 2022-07-11

## 2022-04-28 NOTE — Progress Notes (Signed)
Bobby Hobbs is a 41 y.o. 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 04/17/2020   Dyspnea 01/06/2020   Vomiting 09/06/2019   Diarrhea 09/06/2019   Pain in joint of left shoulder 03/27/2019   Chronic maxillary sinusitis 01/24/2019   Acute pain of left shoulder 01/30/2018   Neck pain on left side 01/30/2018   Muscle spasm 01/30/2018   Eyelid dermatitis, allergic/contact 09/16/2015   Rhinitis, allergic 09/16/2015   Acute gangrenous appendicitis with perforation s/p lap appy 12/28/2014 12/28/2014   Dental caries 06/03/2013    Current Outpatient Medications  Medication Sig Dispense Refill   albuterol (VENTOLIN HFA) 108 (90 Base) MCG/ACT inhaler Use 2 inhalations every 4-6 hours as needed for cough and wheezing. 8 g 5   doxycycline (VIBRA-TABS) 100 MG tablet Take 1 tablet (100 mg total) by mouth 2 (two) times daily. 14 tablet 0   fluticasone (FLONASE) 50 MCG/ACT nasal spray Place 2 sprays into both nostrils daily. 16 mL 5   loratadine (CLARITIN) 10 MG tablet Take 10 mg by mouth daily.     pantoprazole (PROTONIX) 40 MG tablet TAKE 1 TABLET BY MOUTH EVERY DAY 30 tablet 2   predniSONE (DELTASONE) 20 MG tablet Take 2 tablets daily x 2 days then decrease to 1 daily x 5 days 9 tablet 0   SYMBICORT 80-4.5 MCG/ACT inhaler TAKE 2 PUFFS BY MOUTH TWICE A DAY 10.2 each 0   No current facility-administered medications for this visit.    Allergies: Montelukast, Dairy aid [tilactase], and Hycodan [hydrocodone bit-homatrop mbr]  Past Medical History:  Diagnosis Date   Allergy    Asthma    Diarrhea 09/06/2019   GERD (gastroesophageal reflux disease)    Irregular heart beat    Per pt very now and then pt said he has an extra heart beat   Vomiting 09/06/2019    Past Surgical History:  Procedure Laterality Date   LAPAROSCOPIC APPENDECTOMY N/A 12/28/2014   Procedure:  APPENDECTOMY LAPAROSCOPIC;  Surgeon: Armandina Gemma, MD;  Location: [facility];  Service: General;  Laterality: N/A;    Family History  Problem Relation Age of Onset   Hypertension Mother    Achalasia Mother    Diabetes Father    COPD Father    Diabetes Maternal Grandmother    Colon cancer Neg Hx    Esophageal cancer Neg Hx    Rectal cancer Neg Hx    Stomach cancer Neg Hx     Social History   Tobacco Use   Smoking status: Never   Smokeless tobacco: Never  Substance Use Topics   Alcohol use: Yes    Alcohol/week: 0.0 standard drinks of alcohol    Comment: once a year    Subjective:   Presents with concerns for sinus infection; prone to recurrent sinus infections; feels like air quality outside is aggravating underlying asthma/ allergy; + facial pressure; + hard to take deep breath;      Objective:  Vitals:   04/28/22 1107  BP: 118/84  Pulse: 84  Resp: 18  Temp: 98.3 F (36.8 C)  TempSrc: Oral  SpO2: 95%  Weight: 236 lb 6.4 oz (107.2 kg)  Height: '6\' 1"'$  (1.854 m)    General: Well developed, well nourished, in no acute distress  Skin : Warm and dry.  Head: Normocephalic and atraumatic  Eyes: Sclera and conjunctiva clear; pupils round and reactive to  light; extraocular movements intact  Ears: External normal; canals clear; tympanic membranes normal  Oropharynx: Pink, supple. No suspicious lesions  Neck: Supple without thyromegaly, adenopathy  Lungs: Respirations unlabored; clear to auscultation bilaterally without wheeze, rales, rhonchi  CVS exam: normal rate and regular rhythm.  Neurologic: Alert and oriented; speech intact; face symmetrical; moves all extremities well; CNII-XII intact without focal deficit   Assessment:  1. Acute recurrent maxillary sinusitis     Plan:  Rx for Doxycycline and Prednisone; increase fluids, rest and follow up worse, no better; Work note given as requested.   No follow-ups on file.  No orders of the defined types were placed in this  encounter.   Requested Prescriptions   Signed Prescriptions Disp Refills   doxycycline (VIBRA-TABS) 100 MG tablet 14 tablet 0    Sig: Take 1 tablet (100 mg total) by mouth 2 (two) times daily.   predniSONE (DELTASONE) 20 MG tablet 9 tablet 0    Sig: Take 2 tablets daily x 2 days then decrease to 1 daily x 5 days

## 2022-04-29 ENCOUNTER — Other Ambulatory Visit: Payer: Self-pay | Admitting: Gastroenterology

## 2022-07-05 ENCOUNTER — Ambulatory Visit: Payer: 59 | Admitting: Pulmonary Disease

## 2022-07-11 ENCOUNTER — Encounter: Payer: Self-pay | Admitting: Family Medicine

## 2022-07-11 ENCOUNTER — Telehealth (INDEPENDENT_AMBULATORY_CARE_PROVIDER_SITE_OTHER): Payer: 59 | Admitting: Family Medicine

## 2022-07-11 VITALS — Ht 73.0 in

## 2022-07-11 DIAGNOSIS — J069 Acute upper respiratory infection, unspecified: Secondary | ICD-10-CM | POA: Diagnosis not present

## 2022-07-11 DIAGNOSIS — J4540 Moderate persistent asthma, uncomplicated: Secondary | ICD-10-CM

## 2022-07-11 DIAGNOSIS — J309 Allergic rhinitis, unspecified: Secondary | ICD-10-CM

## 2022-07-11 LAB — POC INFLUENZA A&B (BINAX/QUICKVUE)
Influenza A, POC: NEGATIVE
Influenza B, POC: NEGATIVE

## 2022-07-11 LAB — POC COVID19 BINAXNOW: SARS Coronavirus 2 Ag: NEGATIVE

## 2022-07-11 NOTE — Progress Notes (Signed)
Virtual Visit via Video Note I connected with Bobby Hobbs on 07/11/22 by a video enabled telemedicine application and verified that I am speaking with the correct person using two identifiers.  Location patient: home Location provider:work office Persons participating in the virtual visit: patient, provider  I discussed the limitations of evaluation and management by telemedicine and the availability of in person appointments. The patient expressed understanding and agreed to proceed.  Chief Complaint  Patient presents with   Covid Exposure    Tested negative on 10/1, but having sinus symptoms.   HPI: Bobby Hobbs is a 41 yo male with history of allergy rhinitis, asthma, and GERD complaining of 1 to 2 days of respiratory symptoms. Friday he started with mild sore throat but rest of symptoms since yesterday. Fatigue, nasal congestion, rhinorrhea, postnasal drainage,sinus pressure, nonproductive cough, and diarrhea. + Wheezing but no more than his usual, 2 times per week. Negative for fever, chills, body aches,anosmia,[place]ia, CP, abdominal pain, vomiting, urinary symptoms, or skin rash. + Nausea, which she attributes to postnasal drainage. One episode of diarrhea today, no blood in the stool. Denies hx of diarrhea. Home COVID-19 test negative yesterday. Symptoms are stable. His mother was dx'ed with COVID-19 infection a week ago. He has taking Tylenol and Mucinex.  Asthma on Symbicort 80-4.5 mcg twice daily and albuterol inhaler 2 puffs every 4-6 hours as needed.  He follows with pulmonologist, Dr Ander Slade.  Allergy rhinitis on Flonase nasal spray and loratadine. He has been treated f son sinus with antibiotics in May and July this year. Last time he took prednisone was in 04/2022.  ROS: See pertinent positives and negatives per HPI.  Past Medical History:  Diagnosis Date   Allergy    Asthma    Diarrhea 09/06/2019   GERD (gastroesophageal reflux disease)    Irregular  heart beat    Per pt very now and then pt said he has an extra heart beat   Vomiting 09/06/2019    Past Surgical History:  Procedure Laterality Date   LAPAROSCOPIC APPENDECTOMY N/A 12/28/2014   Procedure: APPENDECTOMY LAPAROSCOPIC;  Surgeon: Armandina Gemma, MD;  Location: [facility];  Service: General;  Laterality: N/A;    Family History  Problem Relation Age of Onset   Hypertension Mother    Achalasia Mother    Diabetes Father    COPD Father    Diabetes Maternal Grandmother    Colon cancer Neg Hx    Esophageal cancer Neg Hx    Rectal cancer Neg Hx    Stomach cancer Neg Hx     Social History   Socioeconomic History   Marital status: Married    Spouse name: Janett Billow   Number of children: 0   Years of education: Not on file   Highest education level: Not on file  Occupational History   Occupation: Production assistant, radio  Tobacco Use   Smoking status: Never   Smokeless tobacco: Never  Vaping Use   Vaping Use: Never used  Substance and Sexual Activity   Alcohol use: Yes    Alcohol/week: 0.0 standard drinks of alcohol    Comment: once a year   Drug use: No   Sexual activity: Yes  Other Topics Concern   Not on file  Social History Narrative   Not on file   Social Determinants of Health   Financial Resource Strain: Not on file  Food Insecurity: Not on file  Transportation Needs: Not on file  Physical Activity: Not on file  Stress: Not  on file  Social Connections: Not on file  Intimate Partner Violence: Not on file    Current Outpatient Medications:    albuterol (VENTOLIN HFA) 108 (90 Base) MCG/ACT inhaler, Use 2 inhalations every 4-6 hours as needed for cough and wheezing., Disp: 8 g, Rfl: 5   fluticasone (FLONASE) 50 MCG/ACT nasal spray, Place 2 sprays into both nostrils daily., Disp: 16 mL, Rfl: 5   loratadine (CLARITIN) 10 MG tablet, Take 10 mg by mouth daily., Disp: , Rfl:    pantoprazole (PROTONIX) 40 MG tablet, Take 1 tablet (40 mg total) by mouth daily. Call  [phone] to schedule an office visit for more refills, Disp: 30 tablet, Rfl: 2   SYMBICORT 80-4.5 MCG/ACT inhaler, TAKE 2 PUFFS BY MOUTH TWICE A DAY, Disp: 10.2 each, Rfl: 0  EXAM:  VITALS per patient if applicable:Ht '6\' 1"'$  (1.854 m)   BMI 31.19 kg/m   GENERAL: alert, oriented, appears well and in no acute distress  HEENT: atraumatic, conjunctiva clear, no obvious abnormalities on inspection of external nose and ears  NECK: normal movements of the head and neck  LUNGS: on inspection no signs of respiratory distress, breathing rate appears normal, no obvious gross SOB, gasping or wheezing  CV: no obvious cyanosis  MS: moves all visible extremities without noticeable abnormality  PSYCH/NEURO: pleasant and cooperative, no obvious depression or anxiety, speech and thought processing grossly intact  ASSESSMENT AND PLAN:  Discussed the following assessment and plan:  URI, acute - Plan: POC COVID-19, POC Influenza A&B(BINAX/QUICKVUE) Symptoms suggests a viral etiology, symptomatic treatment recommended. Instructed to monitor for signs of complications, including new onset of fever among some, clearly instructed about warning signs. I also explained that cough and nasal congestion can last a few days and sometimes weeks. He will come to the office today to have rapid flow and COVID-19 test. COVID-19 and flu test here in the office both negative. He can repeat COVID-19 test if fever develops. F/U as needed.  Moderate persistent asthma without complication Asthma does not seem to be well controlled. I do not think systemic steroids are necessary at this time. Allergic to Singulair. Continue follow-up with PCP/pulmonologist.  Allergic rhinitis, unspecified seasonality, unspecified trigger This could be aggravating URI symptoms. History of requiring sinusitis, I do not think antibiotic is needed at this time. Continue Flonase nasal spray and loratadine 10 mg daily. Nasal saline  irrigations to help with nasal congestion will also help.  We discussed possible serious and likely etiologies, options for evaluation and workup, limitations of telemedicine visit vs in person visit, treatment, treatment risks and precautions. The patient was advised to call back or seek an in-person evaluation if the symptoms worsen or if the condition fails to improve as anticipated. I discussed the assessment and treatment plan with the patient. The patient was provided an opportunity to ask questions and all were answered. The patient agreed with the plan and demonstrated an understanding of the instructions.  Return if symptoms worsen or fail to improve.  Arrion Burruel G. [place], MD  [facility]. [place] office.

## 2022-07-14 ENCOUNTER — Ambulatory Visit: Payer: 59 | Admitting: Medical

## 2022-07-14 ENCOUNTER — Other Ambulatory Visit: Payer: Self-pay

## 2022-07-14 VITALS — BP 133/90 | HR 73 | Temp 98.0°F | Resp 18 | Ht 73.0 in | Wt 232.0 lb

## 2022-07-14 DIAGNOSIS — R059 Cough, unspecified: Secondary | ICD-10-CM

## 2022-07-14 DIAGNOSIS — J309 Allergic rhinitis, unspecified: Secondary | ICD-10-CM

## 2022-07-14 DIAGNOSIS — J0100 Acute maxillary sinusitis, unspecified: Secondary | ICD-10-CM | POA: Diagnosis not present

## 2022-07-14 DIAGNOSIS — J029 Acute pharyngitis, unspecified: Secondary | ICD-10-CM

## 2022-07-14 MED ORDER — BENZONATATE 100 MG PO CAPS: 100.0000 mg | ORAL_CAPSULE | Freq: Three times a day (TID) | ORAL | 0 refills | Status: DC | PRN

## 2022-07-14 MED ORDER — AZITHROMYCIN 250 MG PO TABS: ORAL_TABLET | ORAL | 0 refills | Status: DC

## 2022-07-14 NOTE — Progress Notes (Signed)
   Subjective:    Patient ID: [place], male    DOB: October 10, 1981, 41 y.o.   MRN: [id number]  HPI  Pt in for follow up.  Pt had video visit.  "URI, acute - Plan: POC COVID-19, POC Influenza A&B(BINAX/QUICKVUE) Symptoms suggests a viral etiology, symptomatic treatment recommended. Instructed to monitor for signs of complications, including new onset of fever among some, clearly instructed about warning signs. I also explained that cough and nasal congestion can last a few days and sometimes weeks. He will come to the office today to have rapid flow and COVID-19 test. COVID-19 and flu test here in the office both negative. He can repeat COVID-19 test if fever develops. F/U as needed.   Moderate persistent asthma without complication Asthma does not seem to be well controlled. I do not think systemic steroids are necessary at this time. Allergic to Singulair. Continue follow-up with PCP/pulmonologist.   Allergic rhinitis, unspecified seasonality, unspecified trigger This could be aggravating URI symptoms. History of requiring sinusitis, I do not think antibiotic is needed at this time. Continue Flonase nasal spray and loratadine 10 mg daily. Nasal saline irrigations to help with nasal congestion will also help."  Pt states "Fatigue, nasal congestion, rhinorrhea, postnasal drainage,sinus pressure and nonproductive cough" symptoms for 5 days.  No recent wheezing. Some chest congestion. Some maxillary sinus pressure and does state st. Cough is random.  Review of Systems See hpi.    Objective:   Physical Exam  General- No acute distress. Pleasant patient. Neck- Full range of motion, no jvd Lungs- Clear, even and unlabored. Heart- regular rate and rhythm. Neurologic- CNII- XII grossly intact.       Assessment & Plan:   Patient Instructions  Signs/symptoms persist post evaluation/visit since Monday.   Possible allergic rhinitis component so do continue  claritin and flonase. Albuterol inhaler if needed.  With current maxillary sinus pressure will treat for sinus infection. Rx azithromycin antibiotic.  For cough rx benzonatate.  Follow up in 7-10 days or sooner if needed.   Mackie Pai, PA-C

## 2022-07-14 NOTE — Patient Instructions (Addendum)
Signs/symptoms persist post evaluation/visit since Monday.   Possible allergic rhinitis component so do continue claritin and flonase. Albuterol inhaler if needed.  With current maxillary sinus pressure will treat for sinus infection. Rx azithromycin antibiotic.  For cough rx benzonatate.  Follow up in 7-10 days or sooner if needed.

## 2022-07-19 ENCOUNTER — Encounter: Payer: Self-pay | Admitting: Pulmonary Disease

## 2022-07-19 ENCOUNTER — Ambulatory Visit: Payer: 59 | Admitting: Pulmonary Disease

## 2022-07-19 DIAGNOSIS — J4540 Moderate persistent asthma, uncomplicated: Secondary | ICD-10-CM

## 2022-07-19 DIAGNOSIS — J209 Acute bronchitis, unspecified: Secondary | ICD-10-CM

## 2022-07-19 MED ORDER — ALBUTEROL SULFATE HFA 108 (90 BASE) MCG/ACT IN AERS: INHALATION_SPRAY | RESPIRATORY_TRACT | 5 refills | Status: DC

## 2022-07-19 MED ORDER — BUDESONIDE-FORMOTEROL FUMARATE 80-4.5 MCG/ACT IN AERO: INHALATION_SPRAY | RESPIRATORY_TRACT | 5 refills | Status: DC

## 2022-07-19 NOTE — Progress Notes (Signed)
Bobby Hobbs    [id number]    11/06/1980  Primary Care Physician:Saguier, Iris Pert  Referring Physician: Mackie Pai, PA-C [address] [place],  [place] [address]  Chief complaint:   Patient with a history of asthma Has been stable  HPI:  Recently treated for URI with antibiotics  Asthma is usually well controlled Occasionally does need to take albuterol before heavy activity  Did have COVID in January 2022, did not lead to hospitalization or significant exacerbation of his asthma Asthma is stable with use of Symbicort   Asthma is lifelong Only recently started having more problems with it Does not recollect recent exposure or changes in his immediate environment No pets  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   benzonatate (TESSALON) 100 MG capsule Take 1 capsule (100 mg total) by mouth 3 (three) times daily as needed for cough.   fluticasone (FLONASE) 50 MCG/ACT nasal spray Place 2 sprays into both nostrils daily.   loratadine (CLARITIN) 10 MG tablet Take 10 mg by mouth daily.   pantoprazole (PROTONIX) 40 MG tablet Take 1 tablet (40 mg total) by mouth daily. Call [phone] to schedule an office visit for more refills   SYMBICORT 80-4.5 MCG/ACT inhaler TAKE 2 PUFFS BY MOUTH TWICE A DAY   [DISCONTINUED] azithromycin (ZITHROMAX) 250 MG tablet Take 2 tablets on day 1, then 1 tablet daily on days 2 through 5 (Patient not taking: Reported on 07/19/2022)   No facility-administered encounter medications on file as of 07/19/2022.    Allergies as of 07/19/2022 -  Review Complete 07/19/2022  Allergen Reaction Noted   Montelukast Other (See Comments) 12/17/2019   Dairy aid [tilactase]  12/26/2014   Hycodan [hydrocodone bit-homatrop mbr] Nausea And Vomiting 12/19/2011    Past Medical History:  Diagnosis Date   Allergy    Asthma    Diarrhea 09/06/2019   GERD (gastroesophageal reflux disease)    Irregular heart beat    Per pt very now and then pt said he has an extra heart beat   Vomiting 09/06/2019    Past Surgical History:  Procedure Laterality Date   LAPAROSCOPIC APPENDECTOMY N/A 12/28/2014   Procedure: APPENDECTOMY LAPAROSCOPIC;  Surgeon: Armandina Gemma, MD;  Location: [facility];  Service: General;  Laterality: N/A;    Family History  Problem Relation Age of Onset   Hypertension Mother    Achalasia Mother    Diabetes Father    COPD Father    Diabetes Maternal Grandmother    Colon cancer Neg Hx    Esophageal cancer Neg Hx    Rectal cancer Neg Hx    Stomach cancer Neg Hx     Social History   Socioeconomic History   Marital status: Married    Spouse name: Janett Billow   Number of children: 0   Years of education: Not on file   Highest education level: Not on file  Occupational History   Occupation: Production assistant, radio  Tobacco Use   Smoking status: Never   Smokeless tobacco: Never  Vaping Use   Vaping Use: Never used  Substance and Sexual Activity   Alcohol use: Yes    Alcohol/week: 0.0 standard drinks of alcohol    Comment: once a year   Drug use: No   Sexual activity: Yes  Other Topics Concern   Not on file  Social History Narrative   Not on file   Social Determinants of Health   Financial Resource Strain: Not on file  Food Insecurity: Not on file  Transportation Needs: Not on file  Physical Activity: Not on file  Stress: Not on file  Social Connections: Not on file  Intimate Partner Violence: Not on file    Review of Systems  Constitutional: Negative.  Negative for fatigue.  HENT: Negative.    Eyes: Negative.    Respiratory:  Positive for shortness of breath.   Cardiovascular: Negative.   Gastrointestinal: Negative.   Genitourinary: Negative.   Musculoskeletal: Negative.     Vitals:   07/19/22 1306  BP: 114/72  Pulse: 66  Temp: 97.8 F (36.6 C)  SpO2: 98%     Physical Exam Constitutional:      Appearance: He is well-developed.  HENT:     Head: Normocephalic and atraumatic.  Eyes:     General:        Right eye: No discharge.        Left eye: No discharge.  Neck:     Thyroid: No thyromegaly.     Trachea: No tracheal deviation.  Cardiovascular:     Rate and Rhythm: Normal rate and regular rhythm.  Pulmonary:     Effort: Pulmonary effort is normal. No respiratory distress.     Breath sounds: Normal breath sounds. No wheezing or rales.  Chest:     Chest wall: No tenderness.  Musculoskeletal:     Cervical back: No rigidity or tenderness.  Neurological:     Mental Status: He is alert and oriented to person, place, and time.     Cranial Nerves: No cranial nerve deficit.    Data Reviewed: Last chest x-ray on record is 11/24/2020 showing no acute infiltrate  Assessment:   Moderate persistent asthma -Symptoms controlled with Symbicort -Infrequent use of albuterol  Class I obesity   Plan/Recommendations: Continue Symbicort 80, 2 puffs twice a day  Follow-up in a year  Call with significant concerns    Sherrilyn Rist MD [facility] 07/19/2022, 1:08 PM  CC: Mackie Pai, PA-C

## 2022-07-19 NOTE — Patient Instructions (Signed)
I will see you back a year from now  Refills for the medication will be sent to pharmacy  Call us with significant concerns

## 2022-08-01 ENCOUNTER — Other Ambulatory Visit: Payer: Self-pay | Admitting: Gastroenterology

## 2022-08-31 ENCOUNTER — Ambulatory Visit: Payer: 59 | Admitting: Family Medicine

## 2022-08-31 ENCOUNTER — Encounter: Payer: Self-pay | Admitting: Family Medicine

## 2022-08-31 VITALS — BP 117/75 | HR 74 | Ht 72.0 in | Wt 244.4 lb

## 2022-08-31 DIAGNOSIS — J4521 Mild intermittent asthma with (acute) exacerbation: Secondary | ICD-10-CM | POA: Diagnosis not present

## 2022-08-31 MED ORDER — PREDNISONE 20 MG PO TABS: 40.0000 mg | ORAL_TABLET | Freq: Every day | ORAL | 0 refills | Status: AC

## 2022-08-31 NOTE — Patient Instructions (Addendum)
Let's do a 5 day burst of prednisone Consider switching Claritin to Zyrtec Continue with Symbicort as prescribed and as needed albuterol  Please contact office for follow-up if symptoms do not improve or worsen. Seek emergency care if symptoms become severe.

## 2022-08-31 NOTE — Progress Notes (Signed)
   Acute Office Visit  Subjective:     Patient ID: [place], male    DOB: 04/25/1981, 41 y.o.   MRN: [id number]  CC: asthma flare   HPI Patient is in today for asthma flare.  Patient reports he just got back from [place] on 08/18/22 after being away for 2.5 weeks. States that the weather shift started irritating his asthma and then he was around some chopped leaves over the weekend which really seemed to flare up his allergies/asthma. He was having to use his albuterol several times per shift over the weekend and ended up calling out of work on Sunday due to shortness of breath and wheezing. Reports occasional dry cough, but no other URI symptoms, chest pain, fevers, malaise.    ROS All review of systems negative except what is listed in the HPI      Objective:    BP 117/75   Pulse 74   Ht 6' (1.829 m)   Wt 244 lb 6.4 oz (110.9 kg)   SpO2 99%   BMI 33.15 kg/m    Physical Exam Vitals reviewed.  Constitutional:      Appearance: Normal appearance.  Cardiovascular:     Rate and Rhythm: Normal rate and regular rhythm.  Pulmonary:     Effort: Pulmonary effort is normal.     Breath sounds: Normal breath sounds. No wheezing.     Comments: Tight sounding throughout  Neurological:     Mental Status: He is alert and oriented to person, place, and time.  Psychiatric:        Mood and Affect: Mood normal.        Behavior: Behavior normal.        Thought Content: Thought content normal.        Judgment: Judgment normal.     No results found for any visits on 08/31/22.      Assessment & Plan:   Problem List Items Addressed This Visit   None Visit Diagnoses     Mild intermittent asthma with acute exacerbation    -  Primary Let's do a 5 day burst of prednisone Consider switching Claritin to Zyrtec Continue with Symbicort as prescribed and as needed albuterol  Patient aware of signs/symptoms requiring further/urgent evaluation.      Relevant Medications    predniSONE (DELTASONE) 20 MG tablet       Meds ordered this encounter  Medications   predniSONE (DELTASONE) 20 MG tablet    Sig: Take 2 tablets (40 mg total) by mouth daily with breakfast for 5 days.    Dispense:  10 tablet    Refill:  0    Order Specific Question:   Supervising Provider    Answer:   Penni Homans A [[id number]]    Return if symptoms worsen or fail to improve.  Terrilyn Saver, NP

## 2022-09-15 ENCOUNTER — Other Ambulatory Visit: Payer: Self-pay | Admitting: Family

## 2022-09-15 ENCOUNTER — Encounter: Payer: Self-pay | Admitting: Family

## 2022-09-15 ENCOUNTER — Ambulatory Visit: Payer: 59 | Admitting: Family

## 2022-09-15 VITALS — BP 118/84 | HR 64 | Temp 98.1°F | Resp 18 | Ht 72.0 in | Wt 242.6 lb

## 2022-09-15 DIAGNOSIS — J069 Acute upper respiratory infection, unspecified: Secondary | ICD-10-CM

## 2022-09-15 LAB — POC COVID19 BINAXNOW: SARS Coronavirus 2 Ag: NEGATIVE

## 2022-09-15 LAB — POCT INFLUENZA A/B
Influenza A, POC: NEGATIVE
Influenza B, POC: NEGATIVE

## 2022-09-15 MED ORDER — BENZONATATE 100 MG PO CAPS: 100.0000 mg | ORAL_CAPSULE | Freq: Three times a day (TID) | ORAL | 0 refills | Status: DC | PRN

## 2022-09-15 NOTE — Progress Notes (Signed)
Bobby Hobbs is a 41 y.o. 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 04/17/2020   Dyspnea 01/06/2020   Vomiting 09/06/2019   Diarrhea 09/06/2019   Pain in joint of left shoulder 03/27/2019   Chronic maxillary sinusitis 01/24/2019   Acute pain of left shoulder 01/30/2018   Neck pain on left side 01/30/2018   Muscle spasm 01/30/2018   Eyelid dermatitis, allergic/contact 09/16/2015   Rhinitis, allergic 09/16/2015   Acute gangrenous appendicitis with perforation s/p lap appy 12/28/2014 12/28/2014   Dental caries 06/03/2013    Current Outpatient Medications  Medication Sig Dispense Refill   albuterol (VENTOLIN HFA) 108 (90 Base) MCG/ACT inhaler Use 2 inhalations every 4-6 hours as needed for cough and wheezing. 8 g 5   benzonatate (TESSALON) 100 MG capsule Take 1 capsule (100 mg total) by mouth 3 (three) times daily as needed. 20 capsule 0   budesonide-formoterol (SYMBICORT) 80-4.5 MCG/ACT inhaler TAKE 2 PUFFS BY MOUTH TWICE A DAY 10.2 each 5   fluticasone (FLONASE) 50 MCG/ACT nasal spray Place 2 sprays into both nostrils daily. 16 mL 5   loratadine (CLARITIN) 10 MG tablet Take 10 mg by mouth daily.     pantoprazole (PROTONIX) 40 MG tablet Take 1 tablet (40 mg total) by mouth daily. Call [phone] to schedule an office visit for more refills 30 tablet 2   No current facility-administered medications for this visit.    Allergies: Montelukast, Dairy aid [tilactase], and Hycodan [hydrocodone bit-homatrop mbr]  Past Medical History:  Diagnosis Date   Allergy    Asthma    Diarrhea 09/06/2019   GERD (gastroesophageal reflux disease)    Irregular heart beat    Per pt very now and then pt said he has an extra heart beat   Vomiting 09/06/2019    Past Surgical History:  Procedure Laterality Date   LAPAROSCOPIC APPENDECTOMY N/A 12/28/2014   Procedure:  APPENDECTOMY LAPAROSCOPIC;  Surgeon: Armandina Gemma, MD;  Location: [facility];  Service: General;  Laterality: N/A;    Family History  Problem Relation Age of Onset   Hypertension Mother    Achalasia Mother    Diabetes Father    COPD Father    Diabetes Maternal Grandmother    Colon cancer Neg Hx    Esophageal cancer Neg Hx    Rectal cancer Neg Hx    Stomach cancer Neg Hx     Social History   Tobacco Use   Smoking status: Never   Smokeless tobacco: Never  Substance Use Topics   Alcohol use: Yes    Alcohol/week: 0.0 standard drinks of alcohol    Comment: once a year    Subjective:   Sore throat/ cough x 2 days- cough actually started this morning; 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  Head: Normocephalic and atraumatic  Eyes: Sclera and conjunctiva clear; pupils round and reactive to light; extraocular movements intact  Ears: External normal; canals clear; tympanic membranes normal  Oropharynx: Pink, supple. No suspicious lesions  Neck: Supple without thyromegaly, adenopathy  Lungs: Respirations unlabored; clear to auscultation bilaterally without wheeze, rales, rhonchi  CVS exam: normal rate and regular rhythm.  Neurologic: Alert and oriented; speech intact; face symmetrical; moves all extremities well; CNII-XII intact without focal deficit   Assessment:  1. Viral URI with cough     Plan:  Both flu and COVID are negative; physical exam is reassuring; symptomatic treatment discussed; Rx for Tessalon Perles to use prn; increase fluids, rest; work note given as requested.   No follow-ups on file.  Orders Placed This Encounter  Procedures    POC COVID-19    Order Specific Question:   Previously tested for COVID-19    Answer:   No    Order Specific Question:   Resident in a congregate (group) care setting    Answer:   No    Order Specific Question:   Employed in healthcare setting    Answer:   No   POCT Influenza A/B    Requested Prescriptions   Signed Prescriptions Disp Refills   benzonatate (TESSALON) 100 MG capsule 20 capsule 0    Sig: Take 1 capsule (100 mg total) by mouth 3 (three) times daily as needed.

## 2022-09-23 ENCOUNTER — Encounter: Payer: Self-pay | Admitting: Family Medicine

## 2022-09-23 ENCOUNTER — Ambulatory Visit: Payer: 59 | Admitting: Family Medicine

## 2022-09-23 VITALS — BP 114/68 | HR 87 | Temp 97.8°F | Ht 72.0 in | Wt 240.2 lb

## 2022-09-23 DIAGNOSIS — R051 Acute cough: Secondary | ICD-10-CM | POA: Diagnosis not present

## 2022-09-23 DIAGNOSIS — J4520 Mild intermittent asthma, uncomplicated: Secondary | ICD-10-CM | POA: Diagnosis not present

## 2022-09-23 DIAGNOSIS — J45909 Unspecified asthma, uncomplicated: Secondary | ICD-10-CM | POA: Insufficient documentation

## 2022-09-23 DIAGNOSIS — J0100 Acute maxillary sinusitis, unspecified: Secondary | ICD-10-CM | POA: Diagnosis not present

## 2022-09-23 MED ORDER — PROMETHAZINE-DM 6.25-15 MG/5ML PO SYRP: 5.0000 mL | ORAL_SOLUTION | Freq: Four times a day (QID) | ORAL | 0 refills | Status: DC | PRN

## 2022-09-23 MED ORDER — PREDNISONE 20 MG PO TABS: 20.0000 mg | ORAL_TABLET | Freq: Two times a day (BID) | ORAL | 0 refills | Status: AC

## 2022-09-23 MED ORDER — AMOXICILLIN 875 MG PO TABS: 875.0000 mg | ORAL_TABLET | Freq: Two times a day (BID) | ORAL | 0 refills | Status: AC

## 2022-09-23 NOTE — Progress Notes (Signed)
Established Patient Office Visit   Subjective:  Patient ID: Bobby Hobbs, male    DOB: May 29, 1981  Age: 41 y.o. MRN: [id number]  Chief Complaint  Patient presents with  . Cough    Cough, congestion x 10 days seen last week still no improvement.     Cough Associated symptoms include headaches and wheezing. Pertinent negatives include no chills, ear pain, eye redness, fever, myalgias, rash or shortness of breath.   Encounter Diagnoses  Name Primary?  . Acute non-recurrent maxillary sinusitis Yes  . Acute cough   . Mild intermittent reactive airway disease without complication    Presents with a 10-day history of generalized URI signs and symptoms.  Over the last 2 to 3 days he is has been experiencing facial pressure with clear to green rhinorrhea and postnasal drip.  There is a persistent dry cough with wheezing.  He has a history of asthma.  Asthma is generally well-controlled with Symbicort and as needed albuterol.   Review of Systems  Constitutional: Negative.  Negative for chills and fever.  HENT:  Positive for congestion and sinus pain. Negative for ear pain.   Eyes:  Negative for blurred vision, discharge and redness.  Respiratory:  Positive for cough and wheezing. Negative for sputum production and shortness of breath.   Cardiovascular: Negative.   Gastrointestinal:  Negative for abdominal pain.  Genitourinary: Negative.   Musculoskeletal: Negative.  Negative for joint pain and myalgias.  Skin:  Negative for rash.  Neurological:  Positive for headaches. Negative for tingling, loss of consciousness and weakness.  Endo/Heme/Allergies:  Negative for polydipsia.     Current Outpatient Medications:  .  albuterol (VENTOLIN HFA) 108 (90 Base) MCG/ACT inhaler, Use 2 inhalations every 4-6 hours as needed for cough and wheezing., Disp: 8 g, Rfl: 5 .  amoxicillin (AMOXIL) 875 MG tablet, Take 1 tablet (875 mg total) by mouth 2 (two) times daily for 10 days., Disp: 20  tablet, Rfl: 0 .  benzonatate (TESSALON) 100 MG capsule, Take 1 capsule (100 mg total) by mouth 3 (three) times daily as needed., Disp: 20 capsule, Rfl: 0 .  budesonide-formoterol (SYMBICORT) 80-4.5 MCG/ACT inhaler, TAKE 2 PUFFS BY MOUTH TWICE A DAY, Disp: 10.2 each, Rfl: 5 .  fluticasone (FLONASE) 50 MCG/ACT nasal spray, Place 2 sprays into both nostrils daily., Disp: 16 mL, Rfl: 5 .  loratadine (CLARITIN) 10 MG tablet, Take 10 mg by mouth daily., Disp: , Rfl:  .  pantoprazole (PROTONIX) 40 MG tablet, Take 1 tablet (40 mg total) by mouth daily. Call [phone] to schedule an office visit for more refills, Disp: 30 tablet, Rfl: 2 .  predniSONE (DELTASONE) 20 MG tablet, Take 1 tablet (20 mg total) by mouth 2 (two) times daily with a meal for 7 days., Disp: 14 tablet, Rfl: 0 .  promethazine-dextromethorphan (PROMETHAZINE-DM) 6.25-15 MG/5ML syrup, Take 5 mLs by mouth 4 (four) times daily as needed for cough. Drowsy precautions, Disp: 118 mL, Rfl: 0   Objective:     BP 114/68 (BP Location: Right Arm, Patient Position: Sitting, Cuff Size: Large)   Pulse 87   Temp 97.8 F (36.6 C) (Temporal)   Ht 6' (1.829 m)   Wt 240 lb 3.2 oz (109 kg)   SpO2 97%   BMI 32.58 kg/m    Physical Exam Constitutional:      General: He is not in acute distress.    Appearance: Normal appearance. He is not ill-appearing, toxic-appearing or diaphoretic.  HENT:  Head: Normocephalic and atraumatic.      Right Ear: Tympanic membrane, ear canal and external ear normal.     Left Ear: Tympanic membrane, ear canal and external ear normal.     Mouth/Throat:     Mouth: Mucous membranes are moist.     Pharynx: Oropharynx is clear. No oropharyngeal exudate or posterior oropharyngeal erythema.  Eyes:     General: No scleral icterus.       Right eye: No discharge.        Left eye: No discharge.     Extraocular Movements: Extraocular movements intact.     Conjunctiva/sclera: Conjunctivae normal.     Pupils: Pupils  are equal, round, and reactive to light.  Cardiovascular:     Rate and Rhythm: Normal rate and regular rhythm.  Pulmonary:     Effort: Pulmonary effort is normal. No respiratory distress.     Breath sounds: Normal breath sounds. No wheezing or rales.  Musculoskeletal:     Cervical back: No rigidity or tenderness.  Skin:    General: Skin is warm and dry.  Neurological:     Mental Status: He is alert and oriented to person, place, and time.  Psychiatric:        Mood and Affect: Mood normal.        Behavior: Behavior normal.     No results found for any visits on 09/23/22.    The ASCVD Risk score (Arnett DK, et al., 2019) failed to calculate for the following reasons:   Cannot find a previous HDL lab   Cannot find a previous total cholesterol lab    Assessment & Plan:   Acute non-recurrent maxillary sinusitis -     Amoxicillin; Take 1 tablet (875 mg total) by mouth 2 (two) times daily for 10 days.  Dispense: 20 tablet; Refill: 0 -     predniSONE; Take 1 tablet (20 mg total) by mouth 2 (two) times daily with a meal for 7 days.  Dispense: 14 tablet; Refill: 0  Acute cough -     predniSONE; Take 1 tablet (20 mg total) by mouth 2 (two) times daily with a meal for 7 days.  Dispense: 14 tablet; Refill: 0 -     Promethazine-DM; Take 5 mLs by mouth 4 (four) times daily as needed for cough. Drowsy precautions  Dispense: 118 mL; Refill: 0  Mild intermittent reactive airway disease without complication -     predniSONE; Take 1 tablet (20 mg total) by mouth 2 (two) times daily with a meal for 7 days.  Dispense: 14 tablet; Refill: 0    Return in about 1 week (around 09/30/2022), or if symptoms worsen or fail to improve.   Out of work through Monday. Libby Maw, MD

## 2022-09-30 ENCOUNTER — Encounter: Payer: Self-pay | Admitting: Medical

## 2022-09-30 ENCOUNTER — Ambulatory Visit (HOSPITAL_BASED_OUTPATIENT_CLINIC_OR_DEPARTMENT_OTHER)
Admission: RE | Admit: 2022-09-30 | Discharge: 2022-09-30 | Disposition: A | Discharge status: Home / Self Care | Payer: 59 | Source: Ambulatory Visit | Attending: Medical | Admitting: Medical

## 2022-09-30 ENCOUNTER — Ambulatory Visit: Payer: 59 | Admitting: Medical

## 2022-09-30 VITALS — BP 120/76 | HR 98 | Temp 97.8°F | Resp 18 | Ht 72.0 in | Wt 241.6 lb

## 2022-09-30 DIAGNOSIS — J4521 Mild intermittent asthma with (acute) exacerbation: Secondary | ICD-10-CM | POA: Diagnosis not present

## 2022-09-30 DIAGNOSIS — J0100 Acute maxillary sinusitis, unspecified: Secondary | ICD-10-CM

## 2022-09-30 DIAGNOSIS — R059 Cough, unspecified: Secondary | ICD-10-CM | POA: Diagnosis present

## 2022-09-30 MED ORDER — HYDROCOD POLI-CHLORPHE POLI ER 10-8 MG/5ML PO SUER: 5.0000 mL | Freq: Two times a day (BID) | ORAL | 0 refills | Status: DC | PRN

## 2022-09-30 NOTE — Progress Notes (Signed)
   Subjective:    Patient ID: Bobby Hobbs, male    DOB: 1981/06/30, 41 y.o.   MRN: [id number]  HPI Pt in with coughing, congestion and pnd for at least 2 weeks. Pt had 3 visits for this.  Pt had negative covid test, strep and flu test.   Last visit dx sinus infection. Given amoxicilline, prednisone and pherngan dm. Pt states phenergan make him too drowsy. Also tried benzonatate.  Pt not having any gerd symptoms.   Some wheezing.  None of providers that have seen pt recently have done cxr.   Review of Systems  Constitutional:  Negative for chills, fatigue and fever.  Respiratory:  Positive for cough and wheezing. Negative for chest tightness and shortness of breath.        Minimal wheeze.  Cardiovascular:  Negative for chest pain and palpitations.  Gastrointestinal:  Negative for abdominal pain.  Genitourinary:  Negative for dysuria and flank pain.  Musculoskeletal:  Negative for back pain.  Neurological:  Negative for dizziness, speech difficulty, light-headedness and headaches.  Hematological:  Negative for adenopathy. Does not bruise/bleed easily.       Objective:   Physical Exam  General- No acute distress. Pleasant patient. Neck- Full range of motion, no jvd Lungs- Clear, even and unlabored. Heart- regular rate and rhythm. Neurologic- CNII- XII grossly intact.   Heent- maxillary sinus pressure. Sounds congested. Normal tms and canals clear.       Assessment & Plan:   Patient Instructions  Persisting sinus infection symptoms with severe cough and history of asthma.  Recently given amoxicillin.  Will see how you do with this.  If not significantly improved by Tuesday morning let me know.  In that event would give stronger antibiotic.  Continue prednisone continue Symbicort.  Prescribing tussionex cough medication.  Rx advisement given.  Discussed that you used hydrocodone in the past with no issues but you could not tolerate Hycodan.  Some similarity to  meds but not the exact same.  Benefits versus risk discussed.  Chest x-ray today.  Follow-up in 7 to 10 days or sooner if needed.  Please update me on Tuesday morning regarding how you are doing.  Need to know how you are doing before able to fill out FMLA type paperwork.   .mec

## 2022-09-30 NOTE — Patient Instructions (Addendum)
Persisting sinus infection symptoms with severe cough and history of asthma.  Recently given amoxicillin.  Will see how you do with this.  If not significantly improved by Tuesday morning let me know.  In that event would give stronger antibiotic.  Continue prednisone continue Symbicort.  Prescribing tussionex cough medication.  Rx advisement given.  Discussed that you used hydrocodone in the past with no issues but you could not tolerate Hycodan.  Some similarity to meds but not the exact same.  Benefits versus risk discussed.  Chest x-ray today.  Follow-up in 7 to 10 days or sooner if needed.  Please update me on Tuesday morning regarding how you are doing.  Need to know how you are doing before able to fill out FMLA type paperwork.

## 2022-10-04 ENCOUNTER — Encounter: Payer: Self-pay | Admitting: Medical

## 2022-10-05 NOTE — Telephone Encounter (Signed)
Spoke with pt , stated he just needs his FMLA paperwork completed

## 2022-10-05 NOTE — Telephone Encounter (Signed)
When is patient returning back to work?

## 2022-11-08 ENCOUNTER — Ambulatory Visit: Payer: 59 | Admitting: Medical

## 2022-11-08 VITALS — BP 116/64 | HR 88 | Temp 100.2°F | Resp 18 | Ht 72.0 in | Wt 237.0 lb

## 2022-11-08 DIAGNOSIS — J4521 Mild intermittent asthma with (acute) exacerbation: Secondary | ICD-10-CM | POA: Diagnosis not present

## 2022-11-08 DIAGNOSIS — U071 COVID-19: Secondary | ICD-10-CM

## 2022-11-08 MED ORDER — FLUTICASONE PROPIONATE 50 MCG/ACT NA SUSP: 2.0000 | Freq: Every day | NASAL | 1 refills | Status: DC

## 2022-11-08 MED ORDER — HYDROCOD POLI-CHLORPHE POLI ER 10-8 MG/5ML PO SUER: 5.0000 mL | Freq: Two times a day (BID) | ORAL | 0 refills | Status: DC | PRN

## 2022-11-08 MED ORDER — NIRMATRELVIR/RITONAVIR (PAXLOVID)TABLET: 3.0000 | ORAL_TABLET | Freq: Two times a day (BID) | ORAL | 0 refills | Status: AC

## 2022-11-08 NOTE — Patient Instructions (Signed)
COVID infection moderate symptoms and history of reactive airways.  Prescribing Tussionex for your moderate to severe cough.  Rx advisement given.  Use Flonase for nasal congestion.  Prescribed paxlovid antiviral.  Benefits versus risk discussed.  Use Symbicort inhaler daily and add on albuterol if needed.  Can use vitamin D daily over-the-counter.  Some faint sinus pressure on exam.  If this worsens or if you are getting chest congestion with productive cough then would make antibiotic available later in the week.  Keep me updated/notify me by Friday morning if this were to happen.  Follow-up 7 to 10 days or sooner if needed.

## 2022-11-08 NOTE — Progress Notes (Signed)
Subjective:    Patient ID: [place], male    DOB: [birth year], 42 y.o.   MRN: [id number]  HPI  Pt states on Thursday night he thnks may have gotten covid as this was last day he was at work. Then Saturday got cough, congestion, runny nose and bodyaches. No sob or wheezing. Does have dry cough. He tested 3 times and finally on Monday came up + for covid. No hx of covid vaccine. This is his second covid infection.  Pt has history of asthma.   He has symbicort and albuterol at home.    Review of Systems  Constitutional:  Negative for chills, fatigue and fever.  HENT:  Negative for congestion and drooling.   Respiratory:  Positive for cough. Negative for chest tightness, shortness of breath and wheezing.   Cardiovascular:  Negative for chest pain and palpitations.  Gastrointestinal:  Negative for abdominal pain.  Musculoskeletal:  Negative for back pain and myalgias.  Neurological:  Negative for dizziness, light-headedness and headaches.  Hematological:  Negative for adenopathy. Does not bruise/bleed easily.  Psychiatric/Behavioral:  Negative for behavioral problems and confusion.     Past Medical History:  Diagnosis Date   Allergy    Asthma    Diarrhea 09/06/2019   GERD (gastroesophageal reflux disease)    Irregular heart beat    Per pt very now and then pt said he has an extra heart beat   Vomiting 09/06/2019     Social History   Socioeconomic History   Marital status: Married    Spouse name: Janett Billow   Number of children: 0   Years of education: Not on file   Highest education level: Not on file  Occupational History   Occupation: Production assistant, radio  Tobacco Use   Smoking status: Never   Smokeless tobacco: Never  Vaping Use   Vaping Use: Never used  Substance and Sexual Activity   Alcohol use: Yes    Alcohol/week: 0.0 standard drinks of alcohol    Comment: once a year   Drug use: No   Sexual activity: Yes  Other Topics Concern   Not on file   Social History Narrative   Not on file   Social Determinants of Health   Financial Resource Strain: Not on file  Food Insecurity: Not on file  Transportation Needs: Not on file  Physical Activity: Not on file  Stress: Not on file  Social Connections: Not on file  Intimate Partner Violence: Not on file    Past Surgical History:  Procedure Laterality Date   LAPAROSCOPIC APPENDECTOMY N/A 12/28/2014   Procedure: APPENDECTOMY LAPAROSCOPIC;  Surgeon: Armandina Gemma, MD;  Location: [facility];  Service: General;  Laterality: N/A;    Family History  Problem Relation Age of Onset   Hypertension Mother    Achalasia Mother    Diabetes Father    COPD Father    Diabetes Maternal Grandmother    Colon cancer Neg Hx    Esophageal cancer Neg Hx    Rectal cancer Neg Hx    Stomach cancer Neg Hx     Allergies  Allergen Reactions   Montelukast Other (See Comments)    headache   Dairy Aid [Tilactase]    Hycodan [Hydrocodone Bit-Homatrop Mbr] Nausea And Vomiting    Tolerates hydrocodone with acetaminophen    Current Outpatient Medications on File Prior to Visit  Medication Sig Dispense Refill   albuterol (VENTOLIN HFA) 108 (90 Base) MCG/ACT inhaler Use 2 inhalations every 4-6 hours as  needed for cough and wheezing. 8 g 5   benzonatate (TESSALON) 100 MG capsule Take 1 capsule (100 mg total) by mouth 3 (three) times daily as needed. 20 capsule 0   budesonide-formoterol (SYMBICORT) 80-4.5 MCG/ACT inhaler TAKE 2 PUFFS BY MOUTH TWICE A DAY 10.2 each 5   chlorpheniramine-HYDROcodone (TUSSIONEX) 10-8 MG/5ML Take 5 mLs by mouth every 12 (twelve) hours as needed for cough. 115 mL 0   fluticasone (FLONASE) 50 MCG/ACT nasal spray Place 2 sprays into both nostrils daily. 16 mL 5   loratadine (CLARITIN) 10 MG tablet Take 10 mg by mouth daily.     pantoprazole (PROTONIX) 40 MG tablet Take 1 tablet (40 mg total) by mouth daily. Call [phone] to schedule an office visit for more refills 30 tablet 2   No  current facility-administered medications on file prior to visit.    BP 116/64   Pulse 88   Temp 100.2 F (37.9 C)   Resp 18   Ht 6' (1.829 m)   Wt 237 lb (107.5 kg)   SpO2 95%   BMI 32.14 kg/m        Objective:   Physical Exam  General- No acute distress. Pleasant patient. Neck- Full range of motion, no jvd Lungs- Clear, even and unlabored. Heart- regular rate and rhythm. Neurologic- CNII- XII grossly intact.  HEENT-mild frontal and maxillary sinus pressure palpation.  Canals clear normal TMs bilaterally.       Assessment & Plan:   Patient Instructions  COVID infection moderate symptoms and history of reactive airways.  Prescribing Tussionex for your moderate to severe cough.  Rx advisement given.  Use Flonase for nasal congestion.  Prescribed paxlovid antiviral.  Benefits versus risk discussed.  Use Symbicort inhaler daily and add on albuterol if needed.  Can use vitamin D daily over-the-counter.  Some faint sinus pressure on exam.  If this worsens or if you are getting chest congestion with productive cough then would make antibiotic available later in the week.  Keep me updated/notify me by Friday morning if this were to happen.  Follow-up 7 to 10 days or sooner if needed.   Mackie Pai, PA-C

## 2022-11-15 ENCOUNTER — Encounter: Payer: Self-pay | Admitting: Family Medicine

## 2022-11-15 ENCOUNTER — Ambulatory Visit: Payer: 59 | Admitting: Family Medicine

## 2022-11-15 VITALS — BP 126/78 | HR 78 | Temp 97.8°F | Resp 16 | Ht 73.0 in | Wt 238.0 lb

## 2022-11-15 DIAGNOSIS — J0140 Acute pansinusitis, unspecified: Secondary | ICD-10-CM

## 2022-11-15 MED ORDER — AMOXICILLIN-POT CLAVULANATE 875-125 MG PO TABS: 1.0000 | ORAL_TABLET | Freq: Two times a day (BID) | ORAL | 0 refills | Status: DC

## 2022-11-15 NOTE — Progress Notes (Signed)
Acute Office Visit  Subjective:     Patient ID: Bobby Hobbs, male    DOB: 1981-01-17, 42 y.o.   MRN: [id number]  CC: post-COVID sinus pain   HPI Patient is in today for post-COVID sinus pain.  Patient reports he had a COVID exposure at work about 2 weeks ago.  He started showing symptoms on 11/05/2022 and tested positive on 11/07/2022.  He saw PCP on 11/08/2022 and was given Paxlovid, Flonase, cough syrup.  His symptoms have persisted, but sinus pressure has worsened.  His fever broke yesterday morning.  He continues to have congestion, productive cough, sinus pressure, worse to left maxillary, headaches, intermittent mild dyspnea.  He has not needed to use his albuterol inhaler.  He has not had any sore throat, chest pain, wheezing, GI/GU symptoms.  He also needs a note for missed days at work.  Next scheduled shift is this Friday.      ROS All review of systems negative except what is listed in the HPI      Objective:    BP 126/78   Pulse 78   Temp 97.8 F (36.6 C)   Resp 16   Ht '6\' 1"'$  (1.854 m)   Wt 238 lb (108 kg)   SpO2 98%   BMI 31.40 kg/m    Physical Exam Vitals reviewed.  Constitutional:      Appearance: Normal appearance.  HENT:     Head: Normocephalic and atraumatic.     Comments: Tenderness to maxillary sinus percussion bilaterally (L>R)    Nose: Nose normal.     Mouth/Throat:     Mouth: Mucous membranes are moist.     Pharynx: Oropharynx is clear.  Cardiovascular:     Rate and Rhythm: Normal rate and regular rhythm.     Pulses: Normal pulses.     Heart sounds: Normal heart sounds.  Pulmonary:     Effort: Pulmonary effort is normal.     Breath sounds: Normal breath sounds.  Musculoskeletal:     Cervical back: Normal range of motion and neck supple.  Skin:    General: Skin is warm and dry.  Neurological:     Mental Status: He is alert and oriented to person, place, and time.  Psychiatric:        Mood and Affect: Mood normal.         Behavior: Behavior normal.        Thought Content: Thought content normal.        Judgment: Judgment normal.         No results found for any visits on 11/15/22.      Assessment & Plan:   Problem List Items Addressed This Visit   None Visit Diagnoses     Acute non-recurrent pansinusitis    -  Primary   Relevant Medications   amoxicillin-clavulanate (AUGMENTIN) 875-125 MG tablet     Given duration, starting Augmentin. Continue Mucinex and Flonase.  Continue supportive measures including rest, hydration, humidifier use, steam showers, warm compresses to sinuses, warm liquids with lemon and honey, and over-the-counter cough, cold, and analgesics as needed.   Meds ordered this encounter  Medications   amoxicillin-clavulanate (AUGMENTIN) 875-125 MG tablet    Sig: Take 1 tablet by mouth 2 (two) times daily.    Dispense:  20 tablet    Refill:  0    Order Specific Question:   Supervising Provider    Answer:   Penni Homans A [[id number]]    Return if  symptoms worsen or fail to improve.  Terrilyn Saver, NP

## 2022-11-15 NOTE — Patient Instructions (Signed)
Given duration, starting Augmentin. Continue Mucinex and Flonase.  Continue supportive measures including rest, hydration, humidifier use, steam showers, warm compresses to sinuses, warm liquids with lemon and honey, and over-the-counter cough, cold, and analgesics as needed.   Please contact office for follow-up if symptoms do not improve or worsen. Seek emergency care if symptoms become severe.

## 2022-11-30 ENCOUNTER — Other Ambulatory Visit: Payer: Self-pay | Admitting: Medical

## 2022-12-03 ENCOUNTER — Emergency Department (HOSPITAL_BASED_OUTPATIENT_CLINIC_OR_DEPARTMENT_OTHER): Payer: No Typology Code available for payment source

## 2022-12-03 ENCOUNTER — Emergency Department (HOSPITAL_BASED_OUTPATIENT_CLINIC_OR_DEPARTMENT_OTHER)
Admission: EM | Admit: 2022-12-03 | Discharge: 2022-12-03 | Disposition: A | Discharge status: Home / Self Care | Payer: No Typology Code available for payment source | Attending: Emergency Medicine | Admitting: Emergency Medicine

## 2022-12-03 ENCOUNTER — Encounter (HOSPITAL_BASED_OUTPATIENT_CLINIC_OR_DEPARTMENT_OTHER): Payer: Self-pay | Admitting: Emergency Medicine

## 2022-12-03 ENCOUNTER — Other Ambulatory Visit: Payer: Self-pay

## 2022-12-03 DIAGNOSIS — M25511 Pain in right shoulder: Secondary | ICD-10-CM | POA: Diagnosis not present

## 2022-12-03 DIAGNOSIS — Y92149 Unspecified place in prison as the place of occurrence of the external cause: Secondary | ICD-10-CM | POA: Diagnosis not present

## 2022-12-03 DIAGNOSIS — R0781 Pleurodynia: Secondary | ICD-10-CM | POA: Diagnosis not present

## 2022-12-03 DIAGNOSIS — S50311A Abrasion of right elbow, initial encounter: Secondary | ICD-10-CM | POA: Insufficient documentation

## 2022-12-03 DIAGNOSIS — Y990 Civilian activity done for income or pay: Secondary | ICD-10-CM | POA: Diagnosis not present

## 2022-12-03 DIAGNOSIS — M25521 Pain in right elbow: Secondary | ICD-10-CM
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
## 2022-12-03 NOTE — Discharge Instructions (Signed)
Recommend Tylenol and ibuprofen for pain.  Recommend ice.  Follow-up with primary care doctor if still having discomfort.
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
## 2023-01-10 ENCOUNTER — Ambulatory Visit: Payer: 59 | Admitting: Medical

## 2023-01-10 VITALS — BP 127/70 | HR 70 | Temp 97.5°F | Ht 73.0 in | Wt 230.0 lb

## 2023-01-10 DIAGNOSIS — J0100 Acute maxillary sinusitis, unspecified: Secondary | ICD-10-CM | POA: Diagnosis not present

## 2023-01-10 DIAGNOSIS — J309 Allergic rhinitis, unspecified: Secondary | ICD-10-CM | POA: Diagnosis not present

## 2023-01-10 MED ORDER — METHYLPREDNISOLONE 4 MG PO TABS: ORAL_TABLET | ORAL | 0 refills | Status: DC

## 2023-01-10 MED ORDER — LEVOCETIRIZINE DIHYDROCHLORIDE 5 MG PO TABS: 5.0000 mg | ORAL_TABLET | Freq: Every evening | ORAL | 3 refills | Status: DC

## 2023-01-10 MED ORDER — DOXYCYCLINE HYCLATE 100 MG PO TABS: 100.0000 mg | ORAL_TABLET | Freq: Two times a day (BID) | ORAL | 0 refills | Status: DC

## 2023-01-10 NOTE — Patient Instructions (Signed)
Allergic rhinitis and . Subacute maxillary sinusitis  Continue flonase and try xyzal antistamine. Adding on low dose taper medrol for 4 days. Also rx doxycycline for sinus infection.  Follow up in 7-10 days or sooner if needed.

## 2023-01-10 NOTE — Progress Notes (Signed)
Subjective:    Patient ID: [place], male    DOB: Feb 04, 1981, 42 y.o.   MRN: [id number]  HPI  Pt in for recent sinus pressure. He states for past 2 weeks he has struggled with allergies to pollen. He states in past will get sinus infection during allergy season.   Pt has been using claritin and flonase. He states in past tried singulair and caused migraines.  Pt on review has not tried xyzal. He states presently sinus pressure, pnd, sneezing and blowing colored mucus.(Sinus symptoms for one week)    Review of Systems  Constitutional:  Negative for chills, fatigue and fever.  HENT:  Positive for congestion, sinus pressure and sneezing. Negative for facial swelling, hearing loss and sore throat.   Respiratory:  Negative for cough, chest tightness, shortness of breath and wheezing.   Cardiovascular:  Negative for chest pain and palpitations.  Gastrointestinal:  Negative for abdominal pain.  Musculoskeletal:  Negative for back pain, joint swelling, neck pain and neck stiffness.  Skin:  Negative for rash.  Neurological:  Negative for dizziness, seizures, speech difficulty, weakness, numbness and headaches.  Hematological:  Negative for adenopathy.  Psychiatric/Behavioral:  Negative for behavioral problems and decreased concentration.     Past Medical History:  Diagnosis Date   Allergy    Asthma    Diarrhea 09/06/2019   GERD (gastroesophageal reflux disease)    Irregular heart beat    Per pt very now and then pt said he has an extra heart beat   Vomiting 09/06/2019     Social History   Socioeconomic History   Marital status: Married    Spouse name: Janett Billow   Number of children: 0   Years of education: Not on file   Highest education level: Not on file  Occupational History   Occupation: Production assistant, radio  Tobacco Use   Smoking status: Never   Smokeless tobacco: Never  Vaping Use   Vaping Use: Never used  Substance and Sexual Activity   Alcohol use:  Yes    Alcohol/week: 0.0 standard drinks of alcohol    Comment: once a year   Drug use: No   Sexual activity: Yes  Other Topics Concern   Not on file  Social History Narrative   Not on file   Social Determinants of Health   Financial Resource Strain: Not on file  Food Insecurity: Not on file  Transportation Needs: Not on file  Physical Activity: Not on file  Stress: Not on file  Social Connections: Not on file  Intimate Partner Violence: Not on file    Past Surgical History:  Procedure Laterality Date   LAPAROSCOPIC APPENDECTOMY N/A 12/28/2014   Procedure: APPENDECTOMY LAPAROSCOPIC;  Surgeon: Armandina Gemma, MD;  Location: [facility];  Service: General;  Laterality: N/A;    Family History  Problem Relation Age of Onset   Hypertension Mother    Achalasia Mother    Diabetes Father    COPD Father    Diabetes Maternal Grandmother    Colon cancer Neg Hx    Esophageal cancer Neg Hx    Rectal cancer Neg Hx    Stomach cancer Neg Hx     Allergies  Allergen Reactions   Montelukast Other (See Comments)    headache   Dairy Aid [Tilactase]    Hycodan [Hydrocodone Bit-Homatrop Mbr] Nausea And Vomiting    Tolerates hydrocodone with acetaminophen    Current Outpatient Medications on File Prior to Visit  Medication Sig Dispense Refill  albuterol (VENTOLIN HFA) 108 (90 Base) MCG/ACT inhaler Use 2 inhalations every 4-6 hours as needed for cough and wheezing. 8 g 5   amoxicillin-clavulanate (AUGMENTIN) 875-125 MG tablet Take 1 tablet by mouth 2 (two) times daily. 20 tablet 0   benzonatate (TESSALON) 100 MG capsule Take 1 capsule (100 mg total) by mouth 3 (three) times daily as needed. 20 capsule 0   budesonide-formoterol (SYMBICORT) 80-4.5 MCG/ACT inhaler TAKE 2 PUFFS BY MOUTH TWICE A DAY 10.2 each 5   chlorpheniramine-HYDROcodone (TUSSIONEX) 10-8 MG/5ML Take 5 mLs by mouth every 12 (twelve) hours as needed for cough. 115 mL 0   chlorpheniramine-HYDROcodone (TUSSIONEX) 10-8 MG/5ML Take 5  mLs by mouth every 12 (twelve) hours as needed for cough. 70 mL 0   fluticasone (FLONASE) 50 MCG/ACT nasal spray SPRAY 2 SPRAYS INTO EACH NOSTRIL EVERY DAY 48 mL 0   pantoprazole (PROTONIX) 40 MG tablet Take 1 tablet (40 mg total) by mouth daily. Call [phone] to schedule an office visit for more refills 30 tablet 2   No current facility-administered medications on file prior to visit.    BP 127/70   Pulse 70   Temp (!) 97.5 F (36.4 C)   Ht 6\' 1"  (1.854 m)   Wt 230 lb (104.3 kg)   SpO2 98%   BMI 30.34 kg/m        Objective:   Physical Exam  General Mental Status- Alert. General Appearance- Not in acute distress.   Skin General: Color- Normal Color. Moisture- Normal Moisture.  Neck Carotid Arteries- Normal color. Moisture- Normal Moisture. No carotid bruits. No JVD.  Chest and Lung Exam Auscultation: Breath Sounds:-Normal.  Cardiovascular Auscultation:Rythm- Regular. Murmurs & Other Heart Sounds:Auscultation of the heart reveals- No Murmurs.  Abdomen Inspection:-Inspeection Normal. Palpation/Percussion:Note:No mass. Palpation and Percussion of the abdomen reveal- Non Tender, Non Distended + BS, no rebound or guarding.   Neurologic Cranial Nerve exam:- CN III-XII intact(No nystagmus), symmetric smile. Strength:- 5/5 equal and symmetric strength both upper and lower extremities.   Heent- boggy turbinates. Deviated septum. +pnd. Maxillary sinus pressure.       Assessment & Plan:  Allergic rhinitis and . Subacute maxillary sinusitis  Continue flonase and try xyzal antistamine. Adding on low dose taper medrol for 4 days. Also rx doxycycline for sinus infection.  Follow up in 7-10 days or sooner if needed.   Mackie Pai, PA-C

## 2023-01-23 ENCOUNTER — Encounter: Payer: Self-pay | Admitting: *Deleted

## 2023-02-02 ENCOUNTER — Encounter: Payer: Self-pay | Admitting: Family Medicine

## 2023-02-02 ENCOUNTER — Ambulatory Visit: Payer: 59 | Admitting: Family Medicine

## 2023-02-02 VITALS — BP 120/80 | HR 74 | Temp 97.5°F | Ht 73.0 in | Wt 234.0 lb

## 2023-02-02 DIAGNOSIS — K529 Noninfective gastroenteritis and colitis, unspecified: Secondary | ICD-10-CM

## 2023-02-02 MED ORDER — ONDANSETRON 4 MG PO TBDP: 4.0000 mg | ORAL_TABLET | Freq: Three times a day (TID) | ORAL | 0 refills | Status: DC | PRN

## 2023-02-02 NOTE — Patient Instructions (Signed)
Zofran as needed for nausea Can continue trying occasional Pepto or Imodium Stay well hydrated and started with gentle/bland (BRAT) diet like we discussed. Slowly advance diet as tolerated.  If symptoms persist all weekend, let me know and we can test stool samples.

## 2023-02-02 NOTE — Progress Notes (Signed)
Acute Office Visit  Subjective:     Patient ID: Darcy Cordner Breed, male    DOB: 02-Aug-1981, 42 y.o.   MRN: [id number]  Chief Complaint  Patient presents with   GI Problem     Patient is in today for stomach upset.   Diarrhea: Patient complains of diarrhea. Onset of diarrhea was yesterday. Diarrhea is occurring approximately 5 times per day. Patient describes diarrhea as watery. Diarrhea has been associated with  fever briefly, nausea, abdominal discomfort/bloating .  Patient denies blood in stool, recent travel, significant abdominal pain, unintentional weight loss, body aches, chills. Finished doxycycline about a week ago.  Previous visits for diarrhea: none. Evaluation to date: none. Treatment to date: hydration, Pepto (didn't help). No fevers since 2:00 am today, no tylenol or NSAIDs.  Keeping food down okay, but diarrhea within an hour of eating.  He works in the [employer]. Needs a note to miss today's shift. Next scheduled shift is Sunday.      ROS All review of systems negative except what is listed in the HPI      Objective:    BP 120/80   Pulse 74   Temp (!) 97.5 F (36.4 C) (Oral)   Ht  (1.854 m)   Wt 234 lb (106.1 kg)   SpO2 100%   BMI 30.87 kg/m    Physical Exam Vitals reviewed.  Constitutional:      Appearance: Normal appearance.  Cardiovascular:     Rate and Rhythm: Normal rate and regular rhythm.     Pulses: Normal pulses.     Heart sounds: Normal heart sounds.  Pulmonary:     Effort: Pulmonary effort is normal.     Breath sounds: Normal breath sounds.  Abdominal:     General: Bowel sounds are normal. There is no distension.     Palpations: Abdomen is soft. There is no mass.     Tenderness: There is no guarding or rebound.     Comments: Mild generalized tenderness to palpation   Skin:    General: Skin is warm and dry.  Neurological:     Mental Status: He is alert and oriented to person, place, and time.  Psychiatric:        Mood and  Affect: Mood normal.        Behavior: Behavior normal.        Thought Content: Thought content normal.        Judgment: Judgment normal.     No results found for any visits on 02/02/23.      Assessment & Plan:   Problem List Items Addressed This Visit   None Visit Diagnoses     Gastroenteritis    -  Primary Zofran as needed for nausea Can continue trying occasional Pepto or Imodium Stay well hydrated and started with gentle/bland (BRAT) diet like we discussed. Slowly advance diet as tolerated.  If symptoms persist all weekend, let me know and we can test stool samples.      Relevant Medications   ondansetron (ZOFRAN-ODT) 4 MG disintegrating tablet       Meds ordered this encounter  Medications   ondansetron (ZOFRAN-ODT) 4 MG disintegrating tablet    Sig: Take 1 tablet (4 mg total) by mouth every 8 (eight) hours as needed for nausea or vomiting.    Dispense:  20 tablet    Refill:  0    Order Specific Question:   Supervising Provider    Answer:   Danise Edge A [  [id number]]    Return if symptoms worsen or fail to improve.  Terrilyn Saver, NP

## 2023-03-02 ENCOUNTER — Ambulatory Visit: Payer: 59 | Admitting: Family Medicine

## 2023-03-02 ENCOUNTER — Encounter: Payer: Self-pay | Admitting: Family Medicine

## 2023-03-02 VITALS — BP 113/81 | HR 79 | Ht 73.0 in | Wt 235.0 lb

## 2023-03-02 DIAGNOSIS — J4521 Mild intermittent asthma with (acute) exacerbation: Secondary | ICD-10-CM

## 2023-03-02 MED ORDER — PREDNISONE 20 MG PO TABS: 40.0000 mg | ORAL_TABLET | Freq: Every day | ORAL | 0 refills | Status: AC

## 2023-03-02 NOTE — Progress Notes (Signed)
   Acute Office Visit  Subjective:     Patient ID: Bobby Hobbs, male    DOB: [birth year], 42 y.o.   MRN: [id number]  CC: asthma  HPI Patient is in today for asthma flare.   Patient reports the past several days his asthma has been acting up. Reports he always has some spring allergies, but usually well managed. He did have one sinus infection about 6 weeks ago. States that recently he has noticed some tight breathing and occasional wheezing. He is needing his albuterol inhaler 2-3 times per day. Otherwise he has been taking his regular daily meds - Symbicort, Flonase, Xyzal. States he cannot tolerate montelukast due to migraines. He denies productive cough, sinus pain, headache, fevers, chills, body aches, GI/GU symptoms, chest pain.       ROS All review of systems negative except what is listed in the HPI      Objective:    BP 113/81   Pulse 79   Ht 6\' 1"  (1.854 m)   Wt 235 lb (106.6 kg)   SpO2 100%   BMI 31.00 kg/m    Physical Exam Vitals reviewed.  Constitutional:      Appearance: Normal appearance.  HENT:     Head: Normocephalic and atraumatic.     Nose: Nose normal.  Eyes:     Extraocular Movements: Extraocular movements intact.     Conjunctiva/sclera: Conjunctivae normal.  Cardiovascular:     Rate and Rhythm: Normal rate and regular rhythm.     Pulses: Normal pulses.     Heart sounds: Normal heart sounds.  Pulmonary:     Effort: Pulmonary effort is normal.     Breath sounds: Normal breath sounds. No wheezing, rhonchi or rales.     Comments: Mildly tight upper breath sounds Musculoskeletal:     Cervical back: Normal range of motion and neck supple.  Skin:    General: Skin is warm and dry.  Neurological:     Mental Status: He is alert and oriented to person, place, and time.  Psychiatric:        Mood and Affect: Mood normal.        Behavior: Behavior normal.        Thought Content: Thought content normal.        Judgment: Judgment normal.      No results found for any visits on 03/02/23.      Assessment & Plan:   Problem List Items Addressed This Visit   None Visit Diagnoses     Mild intermittent asthma with exacerbation    -  Primary Continue routine medications Adding short prednisone burst Avoid triggers. Patient aware of signs/symptoms requiring further/urgent evaluation.    Relevant Medications   predniSONE (DELTASONE) 20 MG tablet       Meds ordered this encounter  Medications   predniSONE (DELTASONE) 20 MG tablet    Sig: Take 2 tablets (40 mg total) by mouth daily with breakfast for 5 days.    Dispense:  10 tablet    Refill:  0    Order Specific Question:   Supervising Provider    Answer:   Danise Edge A [[id number]]    Return if symptoms worsen or fail to improve.  Clayborne Dana, NP

## 2023-03-30 ENCOUNTER — Other Ambulatory Visit: Payer: Self-pay | Admitting: Family Medicine

## 2023-03-30 ENCOUNTER — Ambulatory Visit: Payer: 59 | Admitting: Family Medicine

## 2023-03-30 ENCOUNTER — Encounter: Payer: Self-pay | Admitting: Family Medicine

## 2023-03-30 VITALS — BP 119/72 | HR 82 | Ht 73.0 in | Wt 227.0 lb

## 2023-03-30 DIAGNOSIS — G8929 Other chronic pain: Secondary | ICD-10-CM | POA: Diagnosis not present

## 2023-03-30 DIAGNOSIS — J309 Allergic rhinitis, unspecified: Secondary | ICD-10-CM | POA: Diagnosis not present

## 2023-03-30 DIAGNOSIS — M25562 Pain in left knee: Secondary | ICD-10-CM

## 2023-03-30 MED ORDER — AZELASTINE HCL 0.1 % NA SOLN: 1.0000 | Freq: Two times a day (BID) | NASAL | 12 refills | Status: AC

## 2023-03-30 MED ORDER — FLUTICASONE PROPIONATE 50 MCG/ACT NA SUSP: 2.0000 | Freq: Every day | NASAL | 6 refills | Status: DC

## 2023-03-30 MED ORDER — AZELASTINE-FLUTICASONE 137-50 MCG/ACT NA SUSP: 1.0000 | Freq: Two times a day (BID) | NASAL | 5 refills | Status: DC

## 2023-03-30 NOTE — Patient Instructions (Signed)
Knee pain: - Continue avoiding triggering activities, but stay active (home PT exercises) - Ice, heat, knee brace for stability,  ibuprofen/aleve as needed - Referral to Ortho  Allergies: - Switching Flonase to Dymista - Continue daily antihistamine - if not adequate response, try switching to a different one (Zyrtec, Allegra, Claritin, etc) - Referral to Allergist

## 2023-03-30 NOTE — Progress Notes (Signed)
Acute Office Visit  Subjective:     Patient ID: Bobby Hobbs, male    DOB: Aug 20, 1981, 42 y.o.   MRN: [id number]  Chief Complaint  Patient presents with   Sinus Problem   Knee Pain    Patient is in today for allergy/sinus issues and chronic left knee pain  Discussed the use of AI scribe software for clinical note transcription with the patient, who gave verbal consent to proceed.  History of Present Illness   The patient, with a history of recurrent sinus infections and allergies, presents with a week of worsening sinus symptoms. He describes 'random sinus issue problems,' 'congestion and pressure,' and a 'pretty bad sinus migraine.' He is currently on Xyzal and Flonase, but reports 'constant up and down of symptoms.' He has a history of allergies to grass pollen and pine pollen, but has not been tested since childhood. He has previously tried Singulair (caused migraines), Allegra (made him feel 'weird'), and Claritin (worked well for a while, but he may have become accustomed to it).  In addition to his sinus issues, he also reports a recurring issue with his left knee. He describes it as popping frequently, feeling unsteady, and causing pain when walking on it a lot. He has previously had physical therapy for this issue, which helped, but the problem has returned. He has been wearing a knee brace for stability and taking over-the-counter Advil for pain. Xray from a few years ago did not show any abnormal findings.            ROS All review of systems negative except what is listed in the HPI      Objective:    BP 119/72   Pulse 82   Ht 6\' 1"  (1.854 m)   Wt 227 lb (103 kg)   SpO2 98%   BMI 29.95 kg/m    Physical Exam Vitals reviewed.  Constitutional:      Appearance: Normal appearance.  HENT:     Head: Normocephalic and atraumatic.     Right Ear: Tympanic membrane normal.     Left Ear: Tympanic membrane normal.     Mouth/Throat:     Mouth: Mucous  membranes are moist.     Pharynx: Oropharynx is clear. No posterior oropharyngeal erythema.  Cardiovascular:     Rate and Rhythm: Normal rate and regular rhythm.     Pulses: Normal pulses.     Heart sounds: Normal heart sounds.  Pulmonary:     Effort: Pulmonary effort is normal.     Breath sounds: Normal breath sounds.  Musculoskeletal:        General: Normal range of motion.     Cervical back: Normal range of motion and neck supple.     Comments: Left knee pain with full flexion  Skin:    General: Skin is warm and dry.  Neurological:     Mental Status: He is alert and oriented to person, place, and time.  Psychiatric:        Mood and Affect: Mood normal.        Behavior: Behavior normal.        Thought Content: Thought content normal.        Judgment: Judgment normal.     No results found for any visits on 03/30/23.      Assessment & Plan:   Problem List Items Addressed This Visit     Rhinitis, allergic - Switching Flonase to Dymista - Continue daily antihistamine - if not  adequate response, try switching to a different one (Zyrtec, Allegra, Claritin, etc) - Referral to Allergist     Relevant Medications   Azelastine-Fluticasone 137-50 MCG/ACT SUSP   Other Relevant Orders   Ambulatory referral to Allergy   Other Visit Diagnoses     Chronic pain of left knee    -  Primary - Continue avoiding triggering activities, but stay active (home PT exercises) - Ice, heat, knee brace for stability,  ibuprofen/aleve as needed - Referral to Ortho    Relevant Orders   Ambulatory referral to Orthopedic Surgery       Meds ordered this encounter  Medications   Azelastine-Fluticasone 137-50 MCG/ACT SUSP    Sig: Place 1 spray into the nose every 12 (twelve) hours.    Dispense:  23 g    Refill:  5    Order Specific Question:   Supervising Provider    Answer:   Danise Edge A [[id number]]    Return if symptoms worsen or fail to improve.  Clayborne Dana, NP

## 2023-04-05 ENCOUNTER — Encounter: Payer: Self-pay | Admitting: Family Medicine

## 2023-04-14 ENCOUNTER — Encounter: Payer: Self-pay | Admitting: Gastroenterology

## 2023-04-24 ENCOUNTER — Ambulatory Visit: Payer: 59 | Admitting: Family Medicine

## 2023-04-24 ENCOUNTER — Telehealth: Payer: Self-pay | Admitting: Medical

## 2023-04-24 ENCOUNTER — Encounter: Payer: Self-pay | Admitting: Family Medicine

## 2023-04-24 ENCOUNTER — Other Ambulatory Visit: Payer: Self-pay | Admitting: Family Medicine

## 2023-04-24 VITALS — BP 108/72 | HR 67 | Temp 98.4°F | Ht 72.0 in | Wt 232.5 lb

## 2023-04-24 DIAGNOSIS — J4540 Moderate persistent asthma, uncomplicated: Secondary | ICD-10-CM | POA: Diagnosis not present

## 2023-04-24 MED ORDER — FLUTICASONE PROPIONATE HFA 110 MCG/ACT IN AERO: INHALATION_SPRAY | RESPIRATORY_TRACT | 1 refills | Status: DC

## 2023-04-24 NOTE — Telephone Encounter (Signed)
PA initiated via Cover[employer]eds; KEY [id number].  Preferred alternatives: Arnuity Ellipta, Qvar Redihaler.   Please advise?

## 2023-04-24 NOTE — Progress Notes (Signed)
Chief Complaint  Patient presents with   Asthma    Bobby Hobbs is a 42 y.o. male here for asthma follow-up.  Currently treated with Symbicort 80-4.5 mcg 2 puffs bid. Compliance is excellent. Uses rescue inhaler 4 times per day over past 1-1.5 mo. He attributes this to air quality decline and allergies.  Reports breathing is much poorer since then. Has also been tight in chest, wheezing and coughing. Had a few nighttime awakenings.  Past Medical History:  Diagnosis Date   Allergy    Asthma    Diarrhea 09/06/2019   GERD (gastroesophageal reflux disease)    Irregular heart beat    Per pt very now and then pt said he has an extra heart beat   Vomiting 09/06/2019    BP 108/72 (BP Location: Left Arm, Patient Position: Sitting, Cuff Size: Normal)   Pulse 67   Temp 98.4 F (36.9 C) (Oral)   Ht 6' (1.829 m)   Wt 232 lb 8 oz (105.5 kg)   SpO2 98%   BMI 31.53 kg/m  Gen: Awake, alert HEENT: MMM, nares patent, no polyps Heart: RRR, no LE edema Lungs: CTAB, no accessory muscle use Msk: No clubbing or cyanosis Psych: Age appropriate judgment and insight  Moderate persistent asthma, unspecified whether complicated - Plan: fluticasone (FLOVENT HFA) 110 MCG/ACT inhaler  Chronic, not controlled.  Continue Symbicort 80-4.5 mcg, 2 puffs twice daily.  For yellow zone flares/mild exacerbations, he will add Flovent 110 mcg 2 puffs twice daily along with the Symbicort.  Continue albuterol as needed.  He was reminded to rinse his mouth out after using the Flovent and Symbicort.  F/u 1 mo to recheck. The patient voiced understanding and agreement to the plan.  Jilda Roche Iliff, DO 04/24/23 11:50 AM

## 2023-04-24 NOTE — Patient Instructions (Signed)
Asthma Action Plan There are 3 zones to consider: 1. Green Zone- This is the zone we hope to keep you in. Avoiding allergens and compliance with inhalers will keep you in this safe zone. No need to take anything extra while in this zone. 2. Yellow Zone- This zone is where we can save time, money and visits. You are not doing well enough to be considered in the green zone, but not bad enough to be in the red zone. This is where we need to remove any allergen or factor that is contributing to your breathing issues. You have a separate inhaler (inhaled steroid) to take twice daily in addition to your other inhalers. This should give you the push you need to get back to the green zone. Contact our office if you have any questions. 3. Red Zone- This is the zone where you should consider calling 911 vs going to the ER. Despite compliance with inhalers/meds (or maybe because we haven't been using them), our breathing isn't where we want it to be. Albuterol isn't helping as much either and you need medical care. This is the zone we try to avoid as much as possible!  Let me know if there are any issues.

## 2023-04-25 MED ORDER — QVAR REDIHALER 80 MCG/ACT IN AERB: 2.0000 | INHALATION_SPRAY | Freq: Two times a day (BID) | RESPIRATORY_TRACT | 3 refills | Status: DC

## 2023-04-25 NOTE — Addendum Note (Signed)
Addended by: Gwenevere Abbot on: 04/25/2023 08:11 AM   Modules accepted: Orders

## 2023-04-25 NOTE — Telephone Encounter (Signed)
Rx changed to Qvar

## 2023-05-03 ENCOUNTER — Ambulatory Visit: Payer: 59 | Admitting: Family Medicine

## 2023-05-03 ENCOUNTER — Encounter: Payer: Self-pay | Admitting: Family Medicine

## 2023-05-03 VITALS — BP 107/81 | HR 88 | Temp 97.4°F | Ht 72.0 in | Wt 232.0 lb

## 2023-05-03 DIAGNOSIS — K529 Noninfective gastroenteritis and colitis, unspecified: Secondary | ICD-10-CM

## 2023-05-03 NOTE — Progress Notes (Signed)
Acute Office Visit  Subjective:     Patient ID: Bobby Hobbs, male    DOB: Jan 06, 1981, 42 y.o.   MRN: [id number]  Chief Complaint  Patient presents with   Diarrhea     Patient is in today for GI upset.   Discussed the use of AI scribe software for clinical note transcription with the patient, who gave verbal consent to proceed.  History of Present Illness   The patient, who works in [employer], presents with ongoing symptoms of a gastrointestinal disturbance. They report experiencing diarrhea and nausea, with near-vomiting episodes. The symptoms have been ongoing, but the patient notes some improvement, with the condition 'slowing down.'  Despite the gastrointestinal upset, the patient has been able to maintain adequate hydration and nutrition, consuming water and electrolyte-replenishing beverages. They deny any significant fatigue or changes in urine output. The patient has been managing the symptoms with over-the-counter medications, including Pepto-Bismol and Imodium, which have provided some relief.  The patient's symptoms began concurrently with their spouse's similar illness, which started a few days prior. The patient had to miss work on Monday and Tuesday due to the severity of the symptoms, particularly the frequent need to rush to the bathroom. They deny any presence of blood in the stool.  The patient's vitals are reported as stable. They have been adhering to a BRAT diet (bananas, rice, applesauce, toast) to manage their symptoms. The patient anticipates a recovery by their next scheduled work shift on Friday, based on their past experiences with similar illnesses lasting about three days.         All review of systems negative except what is listed in the HPI      Objective:    BP 107/81   Pulse 88   Temp (!) 97.4 F (36.3 C) (Oral)   Ht 6' (1.829 m)   Wt 232 lb (105.2 kg)   SpO2 96%   BMI 31.46 kg/m    Physical Exam Vitals reviewed.   Constitutional:      General: He is not in acute distress.    Appearance: Normal appearance. He is not ill-appearing.  Pulmonary:     Effort: Pulmonary effort is normal.  Abdominal:     General: Abdomen is flat. Bowel sounds are normal. There is no distension.     Palpations: Abdomen is soft. There is no mass.     Tenderness: There is no abdominal tenderness. There is no guarding or rebound.  Neurological:     Mental Status: He is alert.  Psychiatric:        Mood and Affect: Mood normal.        Behavior: Behavior normal.        Thought Content: Thought content normal.        Judgment: Judgment normal.     No results found for any visits on 05/03/23.      Assessment & Plan:   Problem List Items Addressed This Visit   None Visit Diagnoses     Gastroenteritis    -  Primary         Acute Gastroenteritis: Ongoing diarrhea and nausea without vomiting or blood in stool. Stable vitals. Maintaining hydration and nutrition. No signs of severe dehydration or complications. -Continue supportive care with hydration and BRAT diet. -Continue over-the-counter medications (Pepto-Bismol and Imodium) as needed. -Return to work on Friday, 05/05/2023, if symptoms resolve. -Provide work note for missed days (Monday, 05/01/2023 and Tuesday, 05/02/2023).  No orders of the defined types were placed in this encounter.   Return if symptoms worsen or fail to improve.  Clayborne Dana, NP

## 2023-05-11 ENCOUNTER — Other Ambulatory Visit: Payer: Self-pay | Admitting: Medical

## 2023-05-12 ENCOUNTER — Other Ambulatory Visit: Payer: Self-pay

## 2023-05-12 ENCOUNTER — Ambulatory Visit (INDEPENDENT_AMBULATORY_CARE_PROVIDER_SITE_OTHER): Payer: 59 | Admitting: Allergy

## 2023-05-12 ENCOUNTER — Encounter: Payer: Self-pay | Admitting: Allergy

## 2023-05-12 VITALS — BP 106/80 | HR 71 | Temp 98.2°F | Resp 16 | Ht 72.0 in | Wt 232.2 lb

## 2023-05-12 DIAGNOSIS — H1013 Acute atopic conjunctivitis, bilateral: Secondary | ICD-10-CM

## 2023-05-12 DIAGNOSIS — T7800XD Anaphylactic reaction due to unspecified food, subsequent encounter: Secondary | ICD-10-CM | POA: Diagnosis not present

## 2023-05-12 DIAGNOSIS — J4540 Moderate persistent asthma, uncomplicated: Secondary | ICD-10-CM

## 2023-05-12 DIAGNOSIS — J310 Chronic rhinitis: Secondary | ICD-10-CM | POA: Diagnosis not present

## 2023-05-12 MED ORDER — EPINEPHRINE 0.3 MG/0.3ML IJ SOAJ: 0.3000 mg | INTRAMUSCULAR | 1 refills | Status: AC | PRN

## 2023-05-12 NOTE — Patient Instructions (Addendum)
-   Daily controller medication(s): Symbicort 80/4.73mcg two puffs twice daily - Prior to physical activity: albuterol 2 puffs 10-15 minutes before physical activity. - Rescue medications: albuterol 2 puffs every 4-6 hours as needed - Changes during respiratory infections or worsening symptoms: Add on Qvar to 2 puffs twice daily for TWO WEEKS. - If above regimen is not controlling symptoms enough then would step-up to Symbicort inhaler.  Discussed above Symbicort would be triple therapy inhalers and/or asthma biologic injectable medications for asthma control  - Asthma control goals:  * Full participation in all desired activities (may need albuterol before activity) * Albuterol use two time or less a week on average (not counting use with activity) * Cough interfering with sleep two time or less a month * Oral steroids no more than once a year * No hospitalizations  - Schedule for skin testing visit for environmental allergy testing and milk testing.  Do not take any antihistamines for 3 days prior to this appointment.  - Continue Xyzal 5mg  daily.   Can take additional dose if needed for additional allergy symptoms control - Use Dymista nasal spray (combination spray of Flonase and Astelin) twice a day as needed for runny or stuffy nose.  - If having itchy/watery eyes can use Pataday 1 drop each eye daily as needed - If allergy testing is positive and medication management is not effective enough then consider allergen shots.  Allergy shots "re-train" and "reset" the immune system to ignore environmental allergens and decrease the resulting immune response to those allergens (sneezing, itchy watery eyes, runny nose, nasal congestion, etc).   Allergy shots improve symptoms in 75-85% of patients.   - Continue avoidance of milk/dairy products.  He can baked milk products in the diet. - Recommend access to an epinephrine device -Will provide with emergency action plan and his skin testing  visit  Follow-up for skin testing.     Routine follow-up in 4-6 months or sooner if needed

## 2023-05-12 NOTE — Progress Notes (Signed)
New Patient Note  RE: Bobby Hobbs MRN: [id number] DOB: February 05, 1981 Date of Office Visit: 05/12/2023  Primary care provider: Esperanza Richters, PA-C  Chief Complaint: asthma, allergies  History of present illness: Bobby Hobbs is a 42 y.o. male presenting today for evaluation of asthma and allergic rhinitis.  He has history of asthma.  He states he had it as a child and it came back in adulthood.   He states about a month or go he was having more shortness of breath, chest tightness and "attacks" more than usual.   He states usually his asthma is controlled with Symbicort 80 mcg 2 puffs twice a day and as needed albuterol.   He feels like the heat and pollen is likely big triggers for his symptoms currently. He states he has needed to go to his PCP multiple times for his symptoms over the past 6 weeks or so.   He was prescribed Qvar to add onto his Symbicort currently which she has been doing 1-2 twice a day as needed on top of his Symbicort.  He states some days he does not need to add the extra Jalin Erpelding on.  He states he was prescribed 2- 5 day prednisone bursts over past 6 weeks.  Does not recall any other prednisone burst in the past year. He has not required hospitalization for asthma flares. He does see a pulmonologist, Dr Wynona Neat with last visit in October 2023.   He reports symptoms of sneezing, runny/stuffy nose primarily with pollen season.  He usually doesn't have itchy/watery eyes much.  He uses Xyzal daily at this time.   He has dymista as needed as does not like sticking anything in the nose.   Sinus infections twice a year on average.  He states he was told the 'drains in sinuses are too small' and could have sinus surgery.  He states he knows for people who has had sinus surgery and they are worse after the surgery because he does not want to have any sinus surgery.  He avoids dairy as has had throat closure, swelling, rash, sneezing.  He states as a child he  had anaphylaxis (symptoms above) to ice cream.   He does not have an epipen.  He states he just avoids.  He tolerates baked milk products..    No history of eczema.     Review of systems: 10pt ROS negative unless noted above in HPI  Past medical history: Past Medical History:  Diagnosis Date   Allergy    Asthma    Diarrhea 09/06/2019   GERD (gastroesophageal reflux disease)    Irregular heart beat    Per pt very now and then pt said he has an extra heart beat   Vomiting 09/06/2019    Past surgical history: Past Surgical History:  Procedure Laterality Date   LAPAROSCOPIC APPENDECTOMY N/A 12/28/2014   Procedure: APPENDECTOMY LAPAROSCOPIC;  Surgeon: Darnell Level, MD;  Location: [facility];  Service: General;  Laterality: N/A;    Family history:  Family History  Problem Relation Age of Onset   Hypertension Mother    Achalasia Mother    Diabetes Father    COPD Father    Diabetes Maternal Grandmother    Colon cancer Neg Hx    Esophageal cancer Neg Hx    Rectal cancer Neg Hx    Stomach cancer Neg Hx     Social history: Lives in a home with carpeting in the family room with gas heating and  central cooling.  Dogs in the home.  There is concern for water damage/mildew in the home.  No concern for roaches in the home.  He is a Biochemist, clinical.  Denies smoking history.   Medication List: Current Outpatient Medications  Medication Sig Dispense Refill   albuterol (VENTOLIN HFA) 108 (90 Base) MCG/ACT inhaler Use 2 inhalations every 4-6 hours as needed for cough and wheezing. 8 g 5   azelastine (ASTELIN) 0.1 % nasal spray Place 1 spray into both nostrils 2 (two) times daily. Use in each nostril as directed 30 mL 12   beclomethasone (QVAR REDIHALER) 80 MCG/ACT inhaler Inhale 2 puffs into the lungs 2 (two) times daily. 1 each 3   budesonide-formoterol (SYMBICORT) 80-4.5 MCG/ACT inhaler Inhale 2 puffs into the lungs 2 (two) times daily.     fluticasone (FLONASE) 50 MCG/ACT nasal spray  Place 2 sprays into both nostrils daily. 16 g 6   levocetirizine (XYZAL) 5 MG tablet Take 1 tablet (5 mg total) by mouth every evening. 90 tablet 0   pantoprazole (PROTONIX) 40 MG tablet Take 1 tablet (40 mg total) by mouth daily. Call [phone] to schedule an office visit for more refills 30 tablet 2   No current facility-administered medications for this visit.    Known medication allergies: Allergies  Allergen Reactions   Montelukast Other (See Comments)    headache   Dairy Aid [Tilactase]    Hycodan [Hydrocodone Bit-Homatrop Mbr] Nausea And Vomiting    Tolerates hydrocodone with acetaminophen     Physical examination: Blood pressure 106/80, pulse 71, temperature 98.2 F (36.8 C), resp. rate 16, height 6' (1.829 m), weight 232 lb 3.2 oz (105.3 kg), SpO2 97%.  General: Alert, interactive, in no acute distress. HEENT: PERRLA, TMs pearly gray, turbinates minimally edematous without discharge, post-pharynx mildly erythematous. Neck: Supple without lymphadenopathy. Lungs: Clear to auscultation without wheezing, rhonchi or rales. {no increased work of breathing. CV: Normal S1, S2 without murmurs. Abdomen: Nondistended, nontender. Skin: Warm and dry, without lesions or rashes. Extremities:  No clubbing, cyanosis or edema. Neuro:   Grossly intact.  Diagnositics/Labs:  Spirometry: FEV1: 3.36L 76%, FVC: 4.96L 89% predicted.  S/p albuterol he had a 27% increase in FEV1 to 4.27L 96% which normalized and is significant.   Allergy testing: unable to perform at today's visit due to insurance coverage  Assessment and plan: Moderate persistent asthma - Daily controller medication(s): Symbicort 80/4.56mcg two puffs twice daily - Prior to physical activity: albuterol 2 puffs 10-15 minutes before physical activity. - Rescue medications: albuterol 2 puffs every 4-6 hours as needed - Changes during respiratory infections or worsening symptoms: Add on Qvar to 2 puffs twice daily for TWO  WEEKS. - If above regimen is not controlling symptoms enough then would step-up to Symbicort inhaler.  Discussed above Symbicort would be triple therapy inhalers and/or asthma biologic injectable medications for asthma control  - Asthma control goals:  * Full participation in all desired activities (may need albuterol before activity) * Albuterol use two time or less a week on average (not counting use with activity) * Cough interfering with sleep two time or less a month * Oral steroids no more than once a year * No hospitalizations  Rhinoconjunctivitis - Schedule for skin testing visit for environmental allergy testing and milk testing.  Do not take any antihistamines for 3 days prior to this appointment.  - Continue Xyzal 5mg  daily.   Can take additional dose if needed for additional allergy symptoms control -  Use Dymista nasal spray (combination spray of Flonase and Astelin) twice a day as needed for runny or stuffy nose.  - If having itchy/watery eyes can use Pataday 1 drop each eye daily as needed - If allergy testing is positive and medication management is not effective enough then consider allergen shots.  Allergy shots "re-train" and "reset" the immune system to ignore environmental allergens and decrease the resulting immune response to those allergens (sneezing, itchy watery eyes, runny nose, nasal congestion, etc).   Allergy shots improve symptoms in 75-85% of patients.   Food allergy - Continue avoidance of milk/dairy products.  He can baked milk products in the diet. - Recommend access to an epinephrine device -Will provide with emergency action plan and his skin testing visit  Follow-up for skin testing.     Routine follow-up in 4-6 months or sooner if needed I appreciate the opportunity to take part in Ryson's care. Please do not hesitate to contact me with questions.  Sincerely,   Margo Aye, MD Allergy/Immunology [facility]

## 2023-05-13 ENCOUNTER — Other Ambulatory Visit: Payer: Self-pay | Admitting: Pulmonary Disease

## 2023-05-13 DIAGNOSIS — J4540 Moderate persistent asthma, uncomplicated: Secondary | ICD-10-CM

## 2023-06-06 NOTE — Patient Instructions (Incomplete)
Moderate persistent asthma-controlled - Daily controller medication(s): Symbicort 80/4.28mcg two puffs twice daily - Prior to physical activity: albuterol 2 puffs 10-15 minutes before physical activity. - Rescue medications: albuterol 2 puffs every 4-6 hours as needed - Changes during respiratory infections or worsening symptoms: Add on Qvar to 2 puffs twice daily for TWO WEEKS. - If above regimen is not controlling symptoms enough then would step-up to Symbicort inhaler.  Discussed above Symbicort would be triple therapy inhalers and/or asthma biologic injectable medications for asthma control  - Asthma control goals:  * Full participation in all desired activities (may need albuterol before activity) * Albuterol use two time or less a week on average (not counting use with activity) * Cough interfering with sleep two time or less a month * Oral steroids no more than once a year * No hospitalizations  Allergic rhinitis - Skin prick testing today is positive to mold with adequate controls.  Intradermal skin testing today is stiffed to ragweed, major mold mix 1 major mold mix 2, dust mite, cat, and dog -Start avoidance measures as below - Continue Xyzal 5mg  daily.   Can take additional dose if needed for additional allergy symptoms control - Use Dymista nasal spray (combination spray of Flonase and Astelin) twice a day as needed for runny or stuffy nose.  - If having itchy/watery eyes can use Pataday 1 drop each eye daily as needed - Consider allergen shots.  Allergy shots "re-train" and "reset" the immune system to ignore environmental allergens and decrease the resulting immune response to those allergens (sneezing, itchy watery eyes, runny nose, nasal congestion, etc).   Allergy shots improve symptoms in 75-85% of patients.  -CPT codes given to call insurance to find out cost if interested in starting allergy injections.  After finding out that cost you can call our office to set up an  appointment to start allergy injections  Food allergy - Continue avoidance of milk/dairy products.  Also continue to avoid butter milk in baked goods.  He is able to eat baked milk products without any problems, - Recommend access to an epinephrine device at all times -Follow emergency action plan. Reviewed today .     Routine follow-up in 4-6 months or sooner if needed  Control of Mold Allergen Mold and fungi can grow on a variety of surfaces provided certain temperature and moisture conditions exist.  Outdoor molds grow on plants, decaying vegetation and soil.  The major outdoor mold, Alternaria and Cladosporium, are found in very high numbers during hot and dry conditions.  Generally, a late Summer - Fall peak is seen for common outdoor fungal spores.  Rain will temporarily lower outdoor mold spore count, but counts rise rapidly when the rainy period ends.  The most important indoor molds are Aspergillus and Penicillium.  Dark, humid and poorly ventilated basements are ideal sites for mold growth.  The next most common sites of mold growth are the bathroom and the kitchen.  Outdoor [employer] Use air conditioning and keep windows closed Avoid exposure to decaying vegetation. Avoid leaf raking. Avoid grain handling. Consider wearing a face mask if working in moldy areas.  Indoor Mold Control Maintain humidity below 50%. Clean washable surfaces with 5% bleach solution. Remove sources e.g. Contaminated carpets.  Control of Dog or Cat Allergen Avoidance is the best way to manage a dog or cat allergy. If you have a dog or cat and are allergic to dog or cats, consider removing the dog or cat  from the home. If you have a dog or cat but don't want to find it a new home, or if your family wants a pet even though someone in the household is allergic, here are some strategies that may help keep symptoms at bay:  Keep the pet out of your bedroom and restrict it to only a few rooms. Be advised  that keeping the dog or cat in only one room will not limit the allergens to that room. Don't pet, hug or kiss the dog or cat; if you do, wash your hands with soap and water. High-efficiency particulate air (HEPA) cleaners run continuously in a bedroom or living room can reduce allergen levels over time. Regular use of a high-efficiency vacuum cleaner or a central vacuum can reduce allergen levels. Giving your dog or cat a bath at least once a week can reduce airborne allergen.  Control of Dust Mite Allergen Dust mites play a major role in allergic asthma and rhinitis. They occur in environments with high humidity wherever human skin is found. Dust mites absorb humidity from the atmosphere (ie, they do not drink) and feed on organic matter (including shed human and animal skin). Dust mites are a microscopic type of insect that you cannot see with the naked eye. High levels of dust mites have been detected from mattresses, pillows, carpets, upholstered furniture, bed covers, clothes, soft toys and any woven material. The principal allergen of the dust mite is found in its feces. A gram of dust may contain 1,000 mites and 250,000 fecal particles. Mite antigen is easily measured in the air during house cleaning activities. Dust mites do not bite and do not cause harm to humans, other than by triggering allergies/asthma.  Ways to decrease your exposure to dust mites in your home:  1. Encase mattresses, box springs and pillows with a mite-impermeable barrier or cover  2. Wash sheets, blankets and drapes weekly in hot water (130 F) with detergent and dry them in a dryer on the hot setting.  3. Have the room cleaned frequently with a vacuum cleaner and a damp dust-mop. For carpeting or rugs, vacuuming with a vacuum cleaner equipped with a high-efficiency particulate air (HEPA) filter. The dust mite allergic individual should not be in a room which is being cleaned and should wait 1 hour after cleaning  before going into the room.  4. Do not sleep on upholstered furniture (eg, couches).  5. If possible removing carpeting, upholstered furniture and drapery from the home is ideal. Horizontal blinds should be eliminated in the rooms where the person spends the most time (bedroom, study, television room). Washable vinyl, roller-type shades are optimal.  6. Remove all non-washable stuffed toys from the bedroom. Wash stuffed toys weekly like sheets and blankets above.  7. Reduce indoor humidity to less than 50%. Inexpensive humidity monitors can be purchased at most hardware stores. Do not use a humidifier as can make the problem worse and are not recommended.  Reducing Pollen Exposure The American Academy of Allergy, Asthma and Immunology suggests the following steps to reduce your exposure to pollen during allergy seasons. Do not hang sheets or clothing out to dry; pollen may collect on these items. Do not mow lawns or spend time around freshly cut grass; mowing stirs up pollen. Keep windows closed at night.  Keep car windows closed while driving. Minimize morning activities outdoors, a time when pollen counts are usually at their highest. Stay indoors as much as possible when pollen counts or  humidity is high and on windy days when pollen tends to remain in the air longer. Use air conditioning when possible.  Many air conditioners have filters that trap the pollen spores. Use a HEPA room air filter to remove pollen form the indoor air you breathe.

## 2023-06-07 ENCOUNTER — Encounter: Payer: Self-pay | Admitting: Family

## 2023-06-07 ENCOUNTER — Ambulatory Visit: Payer: 59 | Admitting: Family

## 2023-06-07 ENCOUNTER — Other Ambulatory Visit: Payer: Self-pay

## 2023-06-07 VITALS — BP 104/70 | HR 77 | Temp 97.8°F | Resp 16 | Wt 236.4 lb

## 2023-06-07 DIAGNOSIS — J3089 Other allergic rhinitis: Secondary | ICD-10-CM | POA: Diagnosis not present

## 2023-06-07 DIAGNOSIS — H1013 Acute atopic conjunctivitis, bilateral: Secondary | ICD-10-CM

## 2023-06-07 DIAGNOSIS — J302 Other seasonal allergic rhinitis: Secondary | ICD-10-CM | POA: Diagnosis not present

## 2023-06-07 DIAGNOSIS — T7800XD Anaphylactic reaction due to unspecified food, subsequent encounter: Secondary | ICD-10-CM | POA: Diagnosis not present

## 2023-06-07 DIAGNOSIS — J4540 Moderate persistent asthma, uncomplicated: Secondary | ICD-10-CM

## 2023-06-07 NOTE — Progress Notes (Signed)
Date of Service/Encounter:  06/07/23  Allergy testing appointment   Initial visit on May 12, 2023 by Dr. Delorse Lek, seen for moderate persistent asthma, rhinoconjunctivitis, and anaphylaxis due to food.  Please see that note for additional details.  He reports that his breathing has been doing well  since his last office visit with Dr. Delorse Lek.  He denies any coughing, wheezing, tightness in chest, shortness of breath, or nocturnal awakenings today.  He did pick up his epinephrine autoinjector device that was prescribed at his last office visit, but mentions that he was not sure why his prescribed this.  He reports that he has not had an EpiPen his entire life and just avoids milk/dairy products.  Discussed the importance of always carrying an EpiPen should there be accidental ingestion.  He does report that he can eat milk in baked goods, but cannot do butter milk in baked goods  Today reports for allergy diagnostic testing:    DIAGNOSTICS:  Skin Testing: Environmental allergy panel and select foods. Adequate positive and negative controls Results discussed with patient/family.  Skin prick testing was positive to a few molds and cows milk with adequate controls.  Intradermal skin testing is positive to ragweed, mold mix 1, major mold mix 2, dust mite, cat, and dog   Airborne Adult Perc - 06/07/23 0944     Time Antigen Placed 0914    Allergen Manufacturer Waynette Buttery    Location Back    Number of Test 55    1. Control-Buffer 50% Glycerol Negative    2. Control-Histamine 3+    3. Bahia Negative    4. French Southern Territories Negative    5. Johnson Negative    6. Kentucky Blue Negative    7. Meadow Fescue Negative    8. Perennial Rye Negative    9. Timothy Negative    10. Ragweed Mix Negative    11. Cocklebur Negative    12. Plantain,  English Negative    13. Baccharis Negative    14. Dog Fennel Negative    15. Russian Thistle Negative    16. Lamb's Quarters Negative    17. Sheep Sorrell Negative     18. Rough Pigweed Negative    19. Marsh Elder, Rough Negative    20. Mugwort, Common Negative    21. Box, Elder Negative    22. Cedar, red Negative    23. Sweet Gum Negative    24. Pecan Pollen Negative    25. Pine Mix Negative    26. Walnut, Black Pollen Negative    27. Red Mulberry Negative    28. Ash Mix Negative    29. Birch Mix Negative    30. Beech American Negative    31. Cottonwood, Guinea-Bissau Negative    32. Hickory, White Negative    33. Maple Mix Negative    34. Oak, Guinea-Bissau Mix Negative    35. Sycamore Eastern Negative    36. Alternaria Alternata Negative    37. Cladosporium Herbarum Negative    38. Aspergillus Mix Negative    39. Penicillium Mix Negative    40. Bipolaris Sorokiniana (Helminthosporium) 2+    41. Drechslera Spicifera (Curvularia) Negative    42. Mucor Plumbeus Negative    43. Fusarium Moniliforme Negative    44. Aureobasidium Pullulans (pullulara) 2+    45. Rhizopus Oryzae Negative    46. Botrytis Cinera Negative    47. Epicoccum Nigrum Negative    48. Phoma Betae Negative    49. Dust Mite Mix Negative  50. Cat Hair 10,000 BAU/ml Negative    51.  Dog Epithelia Negative    52. Mixed Feathers Negative    53. Horse Epithelia Negative    54. Cockroach, German Negative    55. Tobacco Leaf Negative             Intradermal - 06/07/23 0911     Time Antigen Placed 5784    Allergen Manufacturer Waynette Buttery    Location Arm    Number of Test 14    Control Negative    Bahia Negative    French Southern Territories Negative    Johnson Negative    7 Grass Negative    Ragweed Mix 2+    Weed Mix Negative    Tree Mix Negative    Mold 1 3+    Mold 2 3+    Mite Mix 3+    Cat 3+    Dog 3+    Cockroach Negative             Food Adult Perc - 06/07/23 0900     Time Antigen Placed 6962    Allergen Manufacturer Waynette Buttery    Location Back    Number of allergen test 2    5. Milk, Cow --   9x4   6. Casein Negative             Allergy testing results were read  and interpreted by myself, documented by clinical staff.  Patient provided with copy of allergy testing along with avoidance measures when indicated.   Moderate persistent asthma-controlled - Daily controller medication(s): Symbicort 80/4.7mcg two puffs twice daily - Prior to physical activity: albuterol 2 puffs 10-15 minutes before physical activity. - Rescue medications: albuterol 2 puffs every 4-6 hours as needed - Changes during respiratory infections or worsening symptoms: Add on Qvar to 2 puffs twice daily for TWO WEEKS. - If above regimen is not controlling symptoms enough then would step-up to Symbicort inhaler.  Discussed above Symbicort would be triple therapy inhalers and/or asthma biologic injectable medications for asthma control  - Asthma control goals:  * Full participation in all desired activities (may need albuterol before activity) * Albuterol use two time or less a week on average (not counting use with activity) * Cough interfering with sleep two time or less a month * Oral steroids no more than once a year * No hospitalizations  Allergic rhinitis - Skin prick testing today is positive to mold with adequate controls.  Intradermal skin testing today is stiffed to ragweed, major mold mix 1 major mold mix 2, dust mite, cat, and dog -Start avoidance measures as below - Continue Xyzal 5mg  daily.   Can take additional dose if needed for additional allergy symptoms control - Use Dymista nasal spray (combination spray of Flonase and Astelin) twice a day as needed for runny or stuffy nose.  - If having itchy/watery eyes can use Pataday 1 drop each eye daily as needed - Consider allergen shots.  Allergy shots "re-train" and "reset" the immune system to ignore environmental allergens and decrease the resulting immune response to those allergens (sneezing, itchy watery eyes, runny nose, nasal congestion, etc).   Allergy shots improve symptoms in 75-85% of patients.  -CPT codes  given to call insurance to find out cost if interested in starting allergy injections.  After finding out that cost you can call our office to set up an appointment to start allergy injections  Food allergy - Continue avoidance of milk/dairy products.  Also  continue to avoid butter milk in baked goods.  He is able to eat baked milk products without any problems, - Recommend access to an epinephrine device at all times -Follow emergency action plan. Reviewed today .     Routine follow-up in 4-6 months or sooner if needed  Control of Mold Allergen Mold and fungi can grow on a variety of surfaces provided certain temperature and moisture conditions exist.  Outdoor molds grow on plants, decaying vegetation and soil.  The major outdoor mold, Alternaria and Cladosporium, are found in very high numbers during hot and dry conditions.  Generally, a late Summer - Fall peak is seen for common outdoor fungal spores.  Rain will temporarily lower outdoor mold spore count, but counts rise rapidly when the rainy period ends.  The most important indoor molds are Aspergillus and Penicillium.  Dark, humid and poorly ventilated basements are ideal sites for mold growth.  The next most common sites of mold growth are the bathroom and the kitchen.  Outdoor [employer] Use air conditioning and keep windows closed Avoid exposure to decaying vegetation. Avoid leaf raking. Avoid grain handling. Consider wearing a face mask if working in moldy areas.  Indoor Mold Control Maintain humidity below 50%. Clean washable surfaces with 5% bleach solution. Remove sources e.g. Contaminated carpets.  Control of Dog or Cat Allergen Avoidance is the best way to manage a dog or cat allergy. If you have a dog or cat and are allergic to dog or cats, consider removing the dog or cat from the home. If you have a dog or cat but don't want to find it a new home, or if your family wants a pet even though someone in the household is  allergic, here are some strategies that may help keep symptoms at bay:  Keep the pet out of your bedroom and restrict it to only a few rooms. Be advised that keeping the dog or cat in only one room will not limit the allergens to that room. Don't pet, hug or kiss the dog or cat; if you do, wash your hands with soap and water. High-efficiency particulate air (HEPA) cleaners run continuously in a bedroom or living room can reduce allergen levels over time. Regular use of a high-efficiency vacuum cleaner or a central vacuum can reduce allergen levels. Giving your dog or cat a bath at least once a week can reduce airborne allergen.  Control of Dust Mite Allergen Dust mites play a major role in allergic asthma and rhinitis. They occur in environments with high humidity wherever human skin is found. Dust mites absorb humidity from the atmosphere (ie, they do not drink) and feed on organic matter (including shed human and animal skin). Dust mites are a microscopic type of insect that you cannot see with the naked eye. High levels of dust mites have been detected from mattresses, pillows, carpets, upholstered furniture, bed covers, clothes, soft toys and any woven material. The principal allergen of the dust mite is found in its feces. A gram of dust may contain 1,000 mites and 250,000 fecal particles. Mite antigen is easily measured in the air during house cleaning activities. Dust mites do not bite and do not cause harm to humans, other than by triggering allergies/asthma.  Ways to decrease your exposure to dust mites in your home:  1. Encase mattresses, box springs and pillows with a mite-impermeable barrier or cover  2. Wash sheets, blankets and drapes weekly in hot water (130 F) with detergent and  dry them in a dryer on the hot setting.  3. Have the room cleaned frequently with a vacuum cleaner and a damp dust-mop. For carpeting or rugs, vacuuming with a vacuum cleaner equipped with a high-efficiency  particulate air (HEPA) filter. The dust mite allergic individual should not be in a room which is being cleaned and should wait 1 hour after cleaning before going into the room.  4. Do not sleep on upholstered furniture (eg, couches).  5. If possible removing carpeting, upholstered furniture and drapery from the home is ideal. Horizontal blinds should be eliminated in the rooms where the person spends the most time (bedroom, study, television room). Washable vinyl, roller-type shades are optimal.  6. Remove all non-washable stuffed toys from the bedroom. Wash stuffed toys weekly like sheets and blankets above.  7. Reduce indoor humidity to less than 50%. Inexpensive humidity monitors can be purchased at most hardware stores. Do not use a humidifier as can make the problem worse and are not recommended.  Reducing Pollen Exposure The American Academy of Allergy, Asthma and Immunology suggests the following steps to reduce your exposure to pollen during allergy seasons. Do not hang sheets or clothing out to dry; pollen may collect on these items. Do not mow lawns or spend time around freshly cut grass; mowing stirs up pollen. Keep windows closed at night.  Keep car windows closed while driving. Minimize morning activities outdoors, a time when pollen counts are usually at their highest. Stay indoors as much as possible when pollen counts or humidity is high and on windy days when pollen tends to remain in the air longer. Use air conditioning when possible.  Many air conditioners have filters that trap the pollen spores. Use a HEPA room air filter to remove pollen form the indoor air you breathe. Nehemiah Settle, FNP [facility]

## 2023-06-09 ENCOUNTER — Encounter: Payer: Self-pay | Admitting: Family Medicine

## 2023-06-09 ENCOUNTER — Ambulatory Visit: Payer: 59 | Admitting: Family Medicine

## 2023-06-09 VITALS — BP 117/81 | HR 76 | Temp 97.6°F | Ht 72.0 in | Wt 236.0 lb

## 2023-06-09 DIAGNOSIS — J0110 Acute frontal sinusitis, unspecified: Secondary | ICD-10-CM

## 2023-06-09 MED ORDER — AMOXICILLIN 875 MG PO TABS: 875.0000 mg | ORAL_TABLET | Freq: Two times a day (BID) | ORAL | 0 refills | Status: AC

## 2023-06-09 NOTE — Progress Notes (Signed)
Acute Office Visit  Subjective:     Patient ID: Bobby Hobbs, male    DOB: Apr 07, 1981, 42 y.o.   MRN: [id number]  Chief Complaint  Patient presents with   Sinus Problem     Patient is in today for URI/sinus symptoms.   Discussed the use of AI scribe software for clinical note transcription with the patient, who gave verbal consent to proceed.  History of Present Illness   The patient, who works in a jail setting, presents with upper respiratory symptoms, primarily sinus pressure, that have been ongoing for approximately five to six days. He reports experiencing a low-grade fever of 100.80F the previous night, which subsequently broke, resulting in night sweats. The patient has not been in his usual work environment for the past week due to caring for his spouse who recently sustained a back injury.  In addition to sinus pressure, the patient also reports headaches and a single episode of coughing up green sputum. He denies any gastrointestinal symptoms such as nausea, vomiting, or diarrhea. He has taken a COVID-19 test as a precaution, which returned negative results.  The patient describes a slightly scratchy throat but denies any ear pain. He has not taken any over-the-counter medications for his symptoms, preferring to avoid medication when possible. He has a history of asthma but reports no recent breathing issues or flare-ups since the addition of an extra inhaler to his regimen.  The patient has been on Astelin nasal spray and Flonase, which he uses irregularly. He has not had an antibiotic since the previous December, which was amoxicillin and was well-tolerated. The patient is due to return to work in a few days.           ROS All review of systems negative except what is listed in the HPI      Objective:    BP 117/81   Pulse 76   Temp 97.6 F (36.4 C) (Oral)   Ht 6' (1.829 m)   Wt 236 lb (107 kg)   SpO2 98%   BMI 32.01 kg/m    Physical  Exam Vitals reviewed.  Constitutional:      Appearance: Normal appearance.  HENT:     Head: Normocephalic and atraumatic.     Right Ear: Tympanic membrane normal.     Left Ear: Tympanic membrane normal.     Nose: Nose normal.     Mouth/Throat:     Mouth: Mucous membranes are moist.     Pharynx: No oropharyngeal exudate or posterior oropharyngeal erythema.     Comments: Mild postnasal drainage Cardiovascular:     Rate and Rhythm: Normal rate and regular rhythm.     Heart sounds: Normal heart sounds.  Pulmonary:     Effort: Pulmonary effort is normal.     Breath sounds: Normal breath sounds. No wheezing, rhonchi or rales.  Skin:    General: Skin is warm and dry.  Neurological:     Mental Status: He is alert and oriented to person, place, and time.  Psychiatric:        Mood and Affect: Mood normal.        Behavior: Behavior normal.        Thought Content: Thought content normal.        Judgment: Judgment normal.     No results found for any visits on 06/09/23.      Assessment & Plan:   Problem List Items Addressed This Visit   None Visit Diagnoses  Acute non-recurrent frontal sinusitis    -  Primary   Relevant Medications   amoxicillin (AMOXIL) 875 MG tablet      Upper Respiratory Infection Sinus pressure, low-grade fever, and cough with green sputum for 5-6 days. No nausea, vomiting, or diarrhea. Negative COVID test. -Continue Astelin and Flonase for sinus pressure and drainage. -Send a "wait and see" prescription for Amoxicillin to [facility]. If no improvement in 24-48 hours, start the antibiotic. -Check-in if symptoms worsen or persist.      Meds ordered this encounter  Medications   amoxicillin (AMOXIL) 875 MG tablet    Sig: Take 1 tablet (875 mg total) by mouth 2 (two) times daily for 7 days.    Dispense:  14 tablet    Refill:  0    Order Specific Question:   Supervising Provider    Answer:   Danise Edge A [[id number]]    Return if symptoms  worsen or fail to improve.  Clayborne Dana, NP

## 2023-06-09 NOTE — Patient Instructions (Signed)
The following information is provided as a general resource for ADULT patients only and does NOT take into account PREGNANCY, ALLERGIES, LIVER CONDITIONS, KIDNEY CONDITIONS, GASTROINTESTINAL CONDITIONS, OR PRESCRIPTION MEDICATION INTERACTIONS. Please be sure to ask your provider if the following are safe to take with your specific medical history, conditions, or current medication regimen if you are unsure.   Adult Basic Symptom Management for Sinusitis  Congestion: Guaifenesin (Mucinex)- follow directions on packaging with a maximum dose of 2400mg in a 24 hour period.  Pain/Fever: Ibuprofen 200mg - 400mg every 4-6 hours as needed (MAX 1200mg in a 24 hour period) Pain/Fever: Tylenol 500mg -1000mg every 6-8 hours as needed (MAX 3000mg in a 24 hour period)  Cough: Dextromethorphan (Delsym)- follow directions on packing with a maximum dose of 120mg in a 24 hour period.  Nasal Stuffiness: Saline nasal spray and/or Nettie Pot with sterile saline solution  Runny Nose: Fluticasone nasal spray (Flonase) OR Mometasone nasal spray (Nasonex) OR Triamcinolone Acetonide nasal spray (Nasacort)- follow directions on the packaging  Pain/Pressure: Warm washcloth to the face  Sore Throat: Warm salt water gargles  If you have allergies, you may also consider taking an oral antihistamine (like Zyrtec or Claritin) as these may also help with your symptoms.  **Many medications will have more than one ingredient, be sure you are reading the packaging carefully and not taking more than one dose of the same kind of medication at the same time or too close together. It is OK to use formulas that have all of the ingredients you want, but do not take them in a combined medication and as separate dose too close together. If you have any questions, the pharmacist will be happy to help you decide what is safe.    

## 2023-06-21 ENCOUNTER — Encounter: Payer: Self-pay | Admitting: Medical

## 2023-07-11 ENCOUNTER — Ambulatory Visit: Payer: 59 | Admitting: Family Medicine

## 2023-07-11 ENCOUNTER — Encounter: Payer: Self-pay | Admitting: Medical

## 2023-07-11 ENCOUNTER — Encounter: Payer: Self-pay | Admitting: Family Medicine

## 2023-07-11 VITALS — BP 118/78 | HR 70 | Temp 98.8°F | Ht 73.0 in | Wt 231.2 lb

## 2023-07-11 DIAGNOSIS — J0100 Acute maxillary sinusitis, unspecified: Secondary | ICD-10-CM | POA: Diagnosis not present

## 2023-07-11 LAB — POC COVID19 BINAXNOW: SARS Coronavirus 2 Ag: NEGATIVE

## 2023-07-11 MED ORDER — PREDNISONE 20 MG PO TABS: 40.0000 mg | ORAL_TABLET | Freq: Every day | ORAL | 0 refills | Status: AC

## 2023-07-11 NOTE — Patient Instructions (Signed)
Continue to push fluids, practice good hand hygiene, and cover your mouth if you cough. ? ?If you start having fevers, shaking or shortness of breath, seek immediate care. ? ?OK to take Tylenol 1000 mg (2 extra strength tabs) or 975 mg (3 regular strength tabs) every 6 hours as needed. ? ?Let us know if you need anything. ?

## 2023-07-11 NOTE — Addendum Note (Signed)
Addended by: Scharlene Gloss B on: 07/11/2023 03:55 PM   Modules accepted: Orders

## 2023-07-11 NOTE — Progress Notes (Signed)
Chief Complaint  Patient presents with   Cough   Sore Throat    Congestion Started on sunday    Bobby Hobbs here for URI complaints.  Duration: 2 days  Associated symptoms: subjective fever, sinus congestion, sinus pain, rhinorrhea, sore throat, chest tightness, and coughing Denies: itchy watery eyes, ear pain, ear drainage, wheezing, shortness of breath, myalgia, and N/V/D, loss of taste/smell Treatment to date: none Sick contacts: No Tested neg for covid.   Past Medical History:  Diagnosis Date   Allergy    Asthma    Diarrhea 09/06/2019   GERD (gastroesophageal reflux disease)    Irregular heart beat    Per pt very now and then pt said he has an extra heart beat   Vomiting 09/06/2019    Objective BP 118/78 (BP Location: Left Arm, Patient Position: Sitting, Cuff Size: Large)   Pulse 70   Temp 98.8 F (37.1 C) (Oral)   Ht 6\' 1"  (1.854 m)   Wt 231 lb 4 oz (104.9 kg)   SpO2 98%   BMI 30.51 kg/m  General: Awake, alert, appears stated age HEENT: AT, [place], ears patent b/l and TM's neg, nares patent w/o discharge, pharynx pink and without exudates, MMM, +TTP over max sinuses Neck: No masses or asymmetry Heart: RRR Lungs: CTAB, no accessory muscle use Psych: Age appropriate judgment and insight, normal mood and affect  Acute maxillary sinusitis, recurrence not specified - Plan: predniSONE (DELTASONE) 20 MG tablet  Continue to push fluids, practice good hand hygiene, cover mouth when coughing. F/u prn. If starting to experience fevers, shaking, or shortness of breath, seek immediate care. Pt voiced understanding and agreement to the plan.  Jilda Roche Garber, DO 07/11/23 3:42 PM

## 2023-08-03 ENCOUNTER — Ambulatory Visit: Payer: 59 | Admitting: Medical

## 2023-08-03 VITALS — BP 115/74 | HR 64 | Temp 98.8°F | Resp 16 | Ht 73.0 in | Wt 230.0 lb

## 2023-08-03 DIAGNOSIS — J0100 Acute maxillary sinusitis, unspecified: Secondary | ICD-10-CM | POA: Diagnosis not present

## 2023-08-03 DIAGNOSIS — R0981 Nasal congestion: Secondary | ICD-10-CM

## 2023-08-03 DIAGNOSIS — R739 Hyperglycemia, unspecified: Secondary | ICD-10-CM

## 2023-08-03 DIAGNOSIS — J3489 Other specified disorders of nose and nasal sinuses: Secondary | ICD-10-CM | POA: Diagnosis not present

## 2023-08-03 DIAGNOSIS — R059 Cough, unspecified: Secondary | ICD-10-CM

## 2023-08-03 LAB — HEMOGLOBIN A1C: Hgb A1c MFr Bld: 5.7 % (ref 4.6–6.5)

## 2023-08-03 MED ORDER — DOXYCYCLINE HYCLATE 100 MG PO TABS: 100.0000 mg | ORAL_TABLET | Freq: Two times a day (BID) | ORAL | 0 refills | Status: DC

## 2023-08-03 MED ORDER — BENZONATATE 100 MG PO CAPS: 100.0000 mg | ORAL_CAPSULE | Freq: Three times a day (TID) | ORAL | 0 refills | Status: DC | PRN

## 2023-08-03 NOTE — Patient Instructions (Addendum)
  Sinusitis Symptoms started on Monday with nasal congestion, sinus pressure, and sore throat, progressing to chest congestion. Left maxillary and frontal sinus tenderness on exam. History of sinus infections. -Prescribe Doxycycline 100mg  twice daily for 10 days. -Continue Flonase nasal spray for congestion.  Chest Congestion Productive cough or wheezing. Asthma stable. -Prescribe Benzonatate for cough. -Continue Symbicort 2 inhalations twice daily. -Add Albuterol as needed for wheezing.  General Health Maintenance -Negative COVID test on 08/02/2023. -Flu testing not indicated due to symptom duration and lack of full symptom picture.  Elevated sugar -A1c today   Follow up when back from vacation if needed. But also schedule wellness exam in one month.

## 2023-08-03 NOTE — Progress Notes (Signed)
Subjective:    Patient ID: Bobby Hobbs, male    DOB: [birth year], 42 y.o.   MRN: [id number]  HPI  Discussed the use of AI scribe software for clinical note transcription with the patient, who gave verbal consent to proceed.  History of Present Illness   The patient, with a history of asthma and recurrent sinus infections, presented with symptoms that began on Monday. The initial symptoms were extreme nasal congestion, predominantly on the left side, and a sore throat. The patient also reported experiencing sinus pressure in both the frontal and maxillary areas. As the week progressed, the patient developed a cough and upper chest congestion but denied any wheezing or exacerbation of his asthma. The patient has not been producing significant amounts of sputum.  The patient also reported waking up sweaty on a couple of occasions but did not notice any fevers, chills, or body aches. He had a negative COVID-19 test three days after the onset of symptoms. The patient is due to travel out of town on Saturday and will be away until the 13th of November.  The patient's current asthma management includes albuterol, Qvar, and Symbicort. i advised today symbicort more effective than qvar and can hold qvar.. The patient also uses Flonase for nasal congestion.       Review of Systems See hpi       Past Medical History:  Diagnosis Date   Allergy    Asthma    Diarrhea 09/06/2019   GERD (gastroesophageal reflux disease)    Irregular heart beat    Per pt very now and then pt said he has an extra heart beat   Vomiting 09/06/2019     Social History   Socioeconomic History   Marital status: Married    Spouse name: Shanda Bumps   Number of children: 0   Years of education: Not on file   Highest education level: Not on file  Occupational History   Occupation: Film/video editor  Tobacco Use   Smoking status: Never    Passive exposure: Never   Smokeless tobacco: Never  Vaping Use    Vaping status: Never Used  Substance and Sexual Activity   Alcohol use: Yes    Alcohol/week: 0.0 standard drinks of alcohol    Comment: once a year   Drug use: No   Sexual activity: Yes  Other Topics Concern   Not on file  Social History Narrative   Not on file   Social Determinants of Health   Financial Resource Strain: Not on file  Food Insecurity: Not on file  Transportation Needs: Not on file  Physical Activity: Not on file  Stress: Not on file  Social Connections: Not on file  Intimate Partner Violence: Not on file    Past Surgical History:  Procedure Laterality Date   LAPAROSCOPIC APPENDECTOMY N/A 12/28/2014   Procedure: APPENDECTOMY LAPAROSCOPIC;  Surgeon: Darnell Level, MD;  Location: [facility];  Service: General;  Laterality: N/A;    Family History  Problem Relation Age of Onset   Hypertension Mother    Achalasia Mother    Diabetes Father    COPD Father    Diabetes Maternal Grandmother    Colon cancer Neg Hx    Esophageal cancer Neg Hx    Rectal cancer Neg Hx    Stomach cancer Neg Hx     Allergies  Allergen Reactions   Montelukast Other (See Comments)    headache   Dairy Aid [Tilactase]    Hycodan [Hydrocodone Bit-Homatrop  Mbr] Nausea And Vomiting    Tolerates hydrocodone with acetaminophen    Current Outpatient Medications on File Prior to Visit  Medication Sig Dispense Refill   albuterol (VENTOLIN HFA) 108 (90 Base) MCG/ACT inhaler Use 2 inhalations every 4-6 hours as needed for cough and wheezing. 8 g 5   azelastine (ASTELIN) 0.1 % nasal spray Place 1 spray into both nostrils 2 (two) times daily. Use in each nostril as directed 30 mL 12   beclomethasone (QVAR REDIHALER) 80 MCG/ACT inhaler Inhale 2 puffs into the lungs 2 (two) times daily. 1 each 3   EPINEPHrine 0.3 mg/0.3 mL IJ SOAJ injection Inject 0.3 mg into the muscle as needed for anaphylaxis. 2 each 1   fluticasone (FLONASE) 50 MCG/ACT nasal spray Place 2 sprays into both nostrils daily. 16 g 6    levocetirizine (XYZAL) 5 MG tablet Take 1 tablet (5 mg total) by mouth every evening. 90 tablet 0   pantoprazole (PROTONIX) 40 MG tablet Take 1 tablet (40 mg total) by mouth daily. Call [phone] to schedule an office visit for more refills 30 tablet 2   SYMBICORT 80-4.5 MCG/ACT inhaler TAKE 2 PUFFS BY MOUTH TWICE A DAY 10.2 each 5   No current facility-administered medications on file prior to visit.    BP 115/74 (BP Location: Right Arm, Patient Position: Sitting, Cuff Size: Large)   Pulse 64   Temp 98.8 F (37.1 C) (Oral)   Resp 16   Ht 6\' 1"  (1.854 m)   Wt 230 lb (104.3 kg)   SpO2 97%   BMI 30.34 kg/m    Objective:   Physical Exam  General- No acute distress. Pleasant patient. Neck- Full range of motion, no jvd Lungs- Clear, even and unlabored. Heart- regular rate and rhythm. Neurologic- CNII- XII grossly intact.  HEENT: Left maxillary and frontal sinuses tender on palpation. Boggy turbinates. Pharynx normal. No lymphadenopathy CHEST: Lungs clear to auscultation.       Assessment & Plan:   Assessment and Plan    Sinusitis Symptoms started on Monday with nasal congestion, sinus pressure, and sore throat, progressing to chest congestion. Left maxillary and frontal sinus tenderness on exam. History of sinus infections. -Prescribe Doxycycline 100mg  twice daily for 10 days. -Continue Flonase nasal spray for congestion.  Chest Congestion Productive cough or wheezing. Asthma stable. -Prescribe Benzonatate for cough. -Continue Symbicort 2 inhalations twice daily. -Add Albuterol as needed for wheezing.  General Health Maintenance -Negative COVID test on 08/02/2023. -Flu testing not indicated due to symptom duration and lack of full symptom picture.   Follow up when back from vacation if needed. But also schedule wellness exam in one month.        Esperanza Richters, PA-C

## 2023-08-24 ENCOUNTER — Ambulatory Visit (HOSPITAL_BASED_OUTPATIENT_CLINIC_OR_DEPARTMENT_OTHER)
Admission: RE | Admit: 2023-08-24 | Discharge: 2023-08-24 | Disposition: A | Discharge status: Home / Self Care | Payer: 59 | Source: Ambulatory Visit | Attending: Medical | Admitting: Medical

## 2023-08-24 ENCOUNTER — Ambulatory Visit: Payer: 59 | Admitting: Medical

## 2023-08-24 VITALS — BP 126/68 | HR 83 | Resp 18 | Ht 73.0 in | Wt 238.0 lb

## 2023-08-24 DIAGNOSIS — M25562 Pain in left knee: Secondary | ICD-10-CM | POA: Diagnosis not present

## 2023-08-24 DIAGNOSIS — M79609 Pain in unspecified limb: Secondary | ICD-10-CM

## 2023-08-24 DIAGNOSIS — G8929 Other chronic pain: Secondary | ICD-10-CM

## 2023-08-24 MED ORDER — CELECOXIB 100 MG PO CAPS: 100.0000 mg | ORAL_CAPSULE | Freq: Two times a day (BID) | ORAL | 0 refills | Status: DC

## 2023-08-24 NOTE — Progress Notes (Signed)
   Subjective:    Patient ID: Bobby Hobbs, male    DOB: 1980/11/07, 42 y.o.   MRN: [id number]  HPI Discussed the use of AI scribe software for clinical note transcription with the patient, who gave verbal consent to proceed.  History of Present Illness   The patient, with a history of chronic left knee pain, reports a recent exacerbation of symptoms. The pain, which has been present for since 2021, has worsened over the past three days, which the patient attributes to an increase in driving time over the past two weeks. The patient describes the pain as always present, but it has been more severe recently, with the knee even swelling on one occasion, though the swelling has since subsided.  In 2021, the patient was referred to a sports medicine specialist due to this ongoing knee pain. An initial x-ray showed no major degenerative changes, but a subsequent ultrasound suggested degenerative changes of the medial meniscus and chronic changes at the insertion of the patellar tendon. The patient underwent physical therapy and was prescribed diclofenac, which led to some improvement in symptoms. However, the patient reports that the knee pain has never fully resolved and has been using a knee brace for a couple of years, particularly at work or during gym activities, to manage the discomfort.  The patient has been self-managing the recent exacerbation of pain with over-the-counter Advil, which he reports as partially effective. The patient also takes a combination tablet of ibuprofen and Tylenol. The patient denies any other new medications.        Review of Systems See hpi.    Objective:   Physical Exam  General- No acute distress. Pleasant patient. Neck- Full range of motion, no jvd Lungs- Clear, even and unlabored. Heart- regular rate and rhythm. Neurologic- CNII- XII grossly intact.  Left knee- mild pain on palpation of lateal and medial aspect. No swelling or warmth. No  crepitus. Pain on palpation of politeal area. Calf not swollen.      Assessment & Plan:   Assessment and Plan    Left Knee Pain Chronic pain with recent exacerbation, likely due to prolonged driving. Previous imaging showed degenerative changes of the medial meniscus and chronic changes at the insertion of the patellar tendon. The knee was previously buckling, but this has improved with physical therapy and diclofenac. Currently using a knee brace and over-the-counter ibuprofen and acetaminophen for pain management. -Refer to sports medicine for further evaluation and management. -Order an ultrasound of the left lower extremity to rule out venous issues due to recent prolonged driving. -Continue using the knee brace. -Discontinue over-the-counter NSAIDs. -Prescribe Celebrex (an NSAID), 20 tablets, to be taken as one tablet twice a day as needed for pain. Advise the patient to only add Tylenol (acetaminophen) if Celebrex is not adequately managing the pain.   Follow up with in 10-14 days provided delay in sports med appt or sooner if needed.        Esperanza Richters, PA-C

## 2023-08-24 NOTE — Patient Instructions (Signed)
Left Knee Pain with some popliteal pain on exam. Chronic pain with recent exacerbation, likely due to prolonged driving. Previous imaging showed degenerative changes of the medial meniscus and chronic changes at the insertion of the patellar tendon. The knee was previously buckling, but this has improved with physical therapy and diclofenac. Currently using a knee brace and over-the-counter ibuprofen and acetaminophen for pain management. -Refer to sports medicine for further evaluation and management. -Order an ultrasound of the left lower extremity to rule out venous issues due to recent prolonged driving. -Continue using the knee brace. -Discontinue over-the-counter NSAIDs. -Prescribe Celebrex (an NSAID), 20 tablets, to be taken as one tablet twice a day as needed for pain. Advise the patient to only add Tylenol (acetaminophen) if Celebrex is not adequately managing the pain.   Follow up with in 10-14 days provided delay in sports med appt or sooner if needed.

## 2023-08-25 NOTE — Progress Notes (Unsigned)
Bobby Hobbs D.[facility] [address] [place] [address] Phone: [phone]   Assessment and Plan:    1. Chronic pain of left knee -Chronic with exacerbation, initial sports medicine visit - Acute on chronic left knee pain likely flared from prolonged driving.  Suspect mild meniscal pathology based on HPI, physical exam, prior imaging - May continue Celebrex 100 mg twice daily for an additional 2 weeks and then discontinue daily use.  Afterwards may use NSAIDs as needed for pain relief.  Recommend limiting chronic NSAIDs to 1-2 times weekly - Start HEP for knee  15 additional minutes spent for educating Therapeutic Home Exercise Program.  This included exercises focusing on stretching, strengthening, with focus on eccentric aspects.   Long term goals include an improvement in range of motion, strength, endurance as well as avoiding reinjury. Patient's frequency would include in 1-2 times a day, 3-5 times a week for a duration of 6-12 weeks. Proper technique shown and discussed handout in great detail with ATC.  All questions were discussed and answered.     Pertinent previous records reviewed include family medicine note 08/24/2023, unremarkable DVT ultrasound 08/24/2023, left knee x-ray 03/29/2020, left knee ultrasound 05/07/2020  Follow Up: As needed if no improvement or worsening of symptoms.  Could consider intra-articular CSI versus physical therapy versus advanced imaging   Subjective:   I, Bobby Hobbs, am serving as a Neurosurgeon for Doctor Bobby Hobbs  Chief Complaint: left knee pain   HPI:   08/28/2023 Patient is a 42 year old male with concerns of left knee pain.Patient states was seen by PCP 08/24/2023  history of chronic left knee pain, reports a recent exacerbation of symptoms. The pain, which has been present for since 2021, has worsened over the past three days, which the patient attributes to an increase in driving time over  the past two weeks. The patient describes the pain as always present, but it has been more severe recently, with the knee even swelling on one occasion, though the swelling has since subsided.   In 2021, the patient was referred to a sports medicine specialist due to this ongoing knee pain. An initial x-ray showed no major degenerative changes, but a subsequent ultrasound suggested degenerative changes of the medial meniscus and chronic changes at the insertion of the patellar tendon. The patient underwent physical therapy and was prescribed diclofenac, which led to some improvement in symptoms. However, the patient reports that the knee pain has never fully resolved and has been using a knee brace for a couple of years, particularly at work or during gym activities, to manage the discomfort.   The patient has been self-managing the recent exacerbation of pain with over-the-counter Advil, which he reports as partially effective. The patient also takes a combination tablet of ibuprofen and Tylenol. The patient denies any other new medications.        Relevant Historical Information: None pertinent  Additional pertinent review of systems negative.   Current Outpatient Medications:    albuterol (VENTOLIN HFA) 108 (90 Base) MCG/ACT inhaler, Use 2 inhalations every 4-6 hours as needed for cough and wheezing., Disp: 8 g, Rfl: 5   azelastine (ASTELIN) 0.1 % nasal spray, Place 1 spray into both nostrils 2 (two) times daily. Use in each nostril as directed, Disp: 30 mL, Rfl: 12   beclomethasone (QVAR REDIHALER) 80 MCG/ACT inhaler, Inhale 2 puffs into the lungs 2 (two) times daily., Disp: 1 each, Rfl: 3   benzonatate (  TESSALON) 100 MG capsule, Take 1 capsule (100 mg total) by mouth 3 (three) times daily as needed for cough., Disp: 30 capsule, Rfl: 0   celecoxib (CELEBREX) 100 MG capsule, Take 1 capsule (100 mg total) by mouth 2 (two) times daily., Disp: 20 capsule, Rfl: 0   doxycycline (VIBRA-TABS) 100 MG  tablet, Take 1 tablet (100 mg total) by mouth 2 (two) times daily., Disp: 20 tablet, Rfl: 0   EPINEPHrine 0.3 mg/0.3 mL IJ SOAJ injection, Inject 0.3 mg into the muscle as needed for anaphylaxis., Disp: 2 each, Rfl: 1   fluticasone (FLONASE) 50 MCG/ACT nasal spray, Place 2 sprays into both nostrils daily., Disp: 16 g, Rfl: 6   levocetirizine (XYZAL) 5 MG tablet, Take 1 tablet (5 mg total) by mouth every evening., Disp: 90 tablet, Rfl: 0   pantoprazole (PROTONIX) 40 MG tablet, Take 1 tablet (40 mg total) by mouth daily. Call [phone] to schedule an office visit for more refills, Disp: 30 tablet, Rfl: 2   SYMBICORT 80-4.5 MCG/ACT inhaler, TAKE 2 PUFFS BY MOUTH TWICE A DAY, Disp: 10.2 each, Rfl: 5   Objective:     Vitals:   08/28/23 1346  Pulse: 72  SpO2: 90%  Weight: 238 lb (108 kg)  Height: 6\' 1"  (1.854 m)      Body mass index is 31.4 kg/m.    Physical Exam:    General:  awake, alert oriented, no acute distress nontoxic Skin: no suspicious lesions or rashes Neuro:sensation intact and strength 5/5 with no deficits, no atrophy, normal muscle tone Psych: No signs of anxiety, depression or other mood disorder  Left knee: No swelling No deformity Neg fluid wave, joint milking ROM Flex 110, Ext 0 TTP mildly medial joint line,   NTTP over the quad tendon, medial fem condyle, lat fem condyle, patella, plica, patella tendon, tibial tuberostiy, fibular head, posterior fossa, pes anserine bursa, gerdy's tubercle,  lateral jt line Neg anterior and posterior drawer Neg lachman Neg sag sign Negative varus stress for laxity, reproduced mild pain Negative valgus stress for laxity, reproduced mild pain Negative McMurray Positive Thessaly  Gait normal    Electronically signed by:  Bobby Hobbs D.[facility] 2:14 PM 08/28/23

## 2023-08-28 ENCOUNTER — Ambulatory Visit: Payer: 59 | Admitting: Sports Medicine

## 2023-08-28 VITALS — HR 72 | Ht 73.0 in | Wt 238.0 lb

## 2023-08-28 DIAGNOSIS — M25562 Pain in left knee: Secondary | ICD-10-CM

## 2023-08-28 DIAGNOSIS — G8929 Other chronic pain: Secondary | ICD-10-CM | POA: Diagnosis not present

## 2023-08-28 NOTE — Patient Instructions (Signed)
Continue Celebrex for 2 weeks  After 2 weeks stop daily use and may use NSAIDS as needed for pain limit to 1-2 times per week  Knee HEP  Work note provided  As needed follow up

## 2023-09-14 ENCOUNTER — Ambulatory Visit: Payer: 59 | Admitting: Medical

## 2023-09-14 VITALS — BP 113/75 | HR 75 | Temp 98.1°F | Resp 16 | Ht 73.0 in | Wt 232.0 lb

## 2023-09-14 DIAGNOSIS — R059 Cough, unspecified: Secondary | ICD-10-CM

## 2023-09-14 MED ORDER — AZITHROMYCIN 250 MG PO TABS: ORAL_TABLET | ORAL | 0 refills | Status: AC

## 2023-09-14 MED ORDER — BENZONATATE 100 MG PO CAPS: 100.0000 mg | ORAL_CAPSULE | Freq: Three times a day (TID) | ORAL | 0 refills | Status: DC | PRN

## 2023-09-14 NOTE — Patient Instructions (Addendum)
Upper Respiratory Infection Recent onset of cough and fatigue after exposure to cold weather. Mild productive cough with clearish yellow sputum. No sinus pain or postnasal drainage. Night sweats reported. No fever, chills, or body aches. Negative COVID test at home. -Perform flu test.(negative) -Prescribe Benzonatate for cough and nasal spray for congestion. -If symptoms worsen  overweekend(increased chest congestion, productive mucus, or sinus pressure), start available antibiotic azithromycin -can add on flonase for nasal congestion if needed.  Asthma No current wheezing or attacks. Regular use of Symbicort and Albuterol available for breakthrough wheezing. -Continue Symbicort as directed. -Use Albuterol as needed for wheezing.  Follow-up in 7-10 days if symptoms persist or sooner if needed.

## 2023-09-14 NOTE — Progress Notes (Signed)
Subjective:    Patient ID: Bobby Hobbs, male    DOB: 27-Nov-1980, 42 y.o.   MRN: [id number]  HPI Discussed the use of AI scribe software for clinical note transcription with the patient, who gave verbal consent to proceed.  History of Present Illness   The patient, with a history of asthma, presented with a cough that started after spending approximately two and a half hours outdoors in cold weather for firearms training yesterday. The cough is mostly dry, but occasionally productive with clearish yellow sputum. He denies any postnasal drainage but reports a scratchy throat. He denies sinus pain.  The patient reported waking up covered in sweat and has been experiencing fatigue for the past couple of days. He attributes the fatigue to disturbed sleep, as he has been waking up every couple of hours. He denies any joint or muscle pain.  Despite the cold weather, the patient does not believe this to be an asthma flare, as cold weather typically improves his asthma symptoms. He has not experienced any wheezing or asthma attacks. He has been using Symbicort daily, morning and night, as prescribed, and has albuterol available for breakthrough wheezing.  The patient tested negative for COVID-19 at home. He has a history of bronchitis, but it has been fifteen years since his last episode. He has not received a flu vaccine.        Review of Systems  Constitutional:  Positive for fatigue. Negative for chills and fever.  HENT:  Positive for congestion. Negative for postnasal drip, sinus pressure and sinus pain.   Cardiovascular:  Negative for chest pain and palpitations.  Gastrointestinal:  Negative for abdominal pain, nausea and vomiting.  Genitourinary:  Negative for dysuria and frequency.  Neurological:  Negative for dizziness and light-headedness.  Hematological:  Negative for adenopathy.    Past Medical History:  Diagnosis Date   Allergy    Asthma    Diarrhea 09/06/2019   GERD  (gastroesophageal reflux disease)    Irregular heart beat    Per pt very now and then pt said he has an extra heart beat   Vomiting 09/06/2019     Social History   Socioeconomic History   Marital status: Married    Spouse name: Shanda Bumps   Number of children: 0   Years of education: Not on file   Highest education level: Not on file  Occupational History   Occupation: Film/video editor  Tobacco Use   Smoking status: Never    Passive exposure: Never   Smokeless tobacco: Never  Vaping Use   Vaping status: Never Used  Substance and Sexual Activity   Alcohol use: Yes    Alcohol/week: 0.0 standard drinks of alcohol    Comment: once a year   Drug use: No   Sexual activity: Yes  Other Topics Concern   Not on file  Social History Narrative   Not on file   Social Determinants of Health   Financial Resource Strain: Not on file  Food Insecurity: Not on file  Transportation Needs: Not on file  Physical Activity: Not on file  Stress: Not on file  Social Connections: Not on file  Intimate Partner Violence: Not on file    Past Surgical History:  Procedure Laterality Date   LAPAROSCOPIC APPENDECTOMY N/A 12/28/2014   Procedure: APPENDECTOMY LAPAROSCOPIC;  Surgeon: Darnell Level, MD;  Location: [facility];  Service: General;  Laterality: N/A;    Family History  Problem Relation Age of Onset   Hypertension Mother  Achalasia Mother    Diabetes Father    COPD Father    Diabetes Maternal Grandmother    Colon cancer Neg Hx    Esophageal cancer Neg Hx    Rectal cancer Neg Hx    Stomach cancer Neg Hx     Allergies  Allergen Reactions   Montelukast Other (See Comments)    headache   Dairy Aid [Tilactase]    Hycodan [Hydrocodone Bit-Homatrop Mbr] Nausea And Vomiting    Tolerates hydrocodone with acetaminophen    Current Outpatient Medications on File Prior to Visit  Medication Sig Dispense Refill   albuterol (VENTOLIN HFA) 108 (90 Base) MCG/ACT inhaler Use 2 inhalations every  4-6 hours as needed for cough and wheezing. 8 g 5   azelastine (ASTELIN) 0.1 % nasal spray Place 1 spray into both nostrils 2 (two) times daily. Use in each nostril as directed 30 mL 12   beclomethasone (QVAR REDIHALER) 80 MCG/ACT inhaler Inhale 2 puffs into the lungs 2 (two) times daily. 1 each 3   EPINEPHrine 0.3 mg/0.3 mL IJ SOAJ injection Inject 0.3 mg into the muscle as needed for anaphylaxis. 2 each 1   levocetirizine (XYZAL) 5 MG tablet Take 1 tablet (5 mg total) by mouth every evening. 90 tablet 0   pantoprazole (PROTONIX) 40 MG tablet Take 1 tablet (40 mg total) by mouth daily. Call [phone] to schedule an office visit for more refills 30 tablet 2   SYMBICORT 80-4.5 MCG/ACT inhaler TAKE 2 PUFFS BY MOUTH TWICE A DAY 10.2 each 5   No current facility-administered medications on file prior to visit.    BP 113/75 (BP Location: Right Arm, Patient Position: Sitting, Cuff Size: Large)   Pulse 75   Temp 98.1 F (36.7 C) (Oral)   Resp 16   Ht 6\' 1"  (1.854 m)   Wt 232 lb (105.2 kg)   SpO2 98%   BMI 30.61 kg/m        Objective:   Physical Exam  General Mental Status- Alert. General Appearance- Not in acute distress.   Skin General: Color- Normal Color. Moisture- Normal Moisture.  Neck Carotid Arteries- Normal color. Moisture- Normal Moisture. No carotid bruits. No JVD.  Chest and Lung Exam Auscultation: Breath Sounds:-CTA  Cardiovascular Auscultation:Rythm- RRR Murmurs & Other Heart Sounds:Auscultation of the heart reveals- No Murmurs.  Abdomen Inspection:-Inspeection Normal. Palpation/Percussion:Note:No mass. Palpation and Percussion of the abdomen reveal- Non Tender, Non Distended + BS, no rebound or guarding.   Neurologic Cranial Nerve exam:- CN III-XII intact(No nystagmus), symmetric smile. Strength:- 5/5 equal and symmetric strength both upper and lower extremities.   Heent- no sinus presssure. Sounds congested. Rt side tm faint pink red center otherwise  normal. Left side normal.  Canals  clear bilaterally. Posterior pharynx. Mild pnd.      Assessment & Plan:   Assessment and Plan    Upper Respiratory Infection Recent onset of cough and fatigue after exposure to cold weather. Mild productive cough with clearish yellow sputum. No sinus pain or postnasal drainage. Night sweats reported. No fever, chills, or body aches. Negative COVID test at home. -Perform flu test.(negative) -Prescribe Benzonatate for cough and nasal spray for congestion. -If symptoms worsen  overweekend(increased chest congestion, productive mucus, or sinus pressure), start available antibiotic aazithromycin  Asthma No current wheezing or attacks. Regular use of Symbicort and Albuterol available for breakthrough wheezing. -Continue Symbicort as directed. -Use Albuterol as needed for wheezing.  Follow-up in 7-10 days if symptoms persist or sooner if needed.  Esperanza Richters, PA-C

## 2023-09-18 ENCOUNTER — Encounter: Payer: Self-pay | Admitting: Medical

## 2023-09-28 ENCOUNTER — Ambulatory Visit: Payer: 59 | Admitting: Medical

## 2023-09-28 VITALS — BP 100/60 | HR 98 | Temp 97.6°F | Resp 18 | Wt 233.0 lb

## 2023-09-28 DIAGNOSIS — R0981 Nasal congestion: Secondary | ICD-10-CM

## 2023-09-28 DIAGNOSIS — J3489 Other specified disorders of nose and nasal sinuses: Secondary | ICD-10-CM

## 2023-09-28 DIAGNOSIS — J4540 Moderate persistent asthma, uncomplicated: Secondary | ICD-10-CM | POA: Diagnosis not present

## 2023-09-28 DIAGNOSIS — H6690 Otitis media, unspecified, unspecified ear: Secondary | ICD-10-CM

## 2023-09-28 MED ORDER — BENZONATATE 100 MG PO CAPS: 100.0000 mg | ORAL_CAPSULE | Freq: Three times a day (TID) | ORAL | 0 refills | Status: DC | PRN

## 2023-09-28 MED ORDER — METHYLPREDNISOLONE 4 MG PO TABS: ORAL_TABLET | ORAL | 0 refills | Status: DC

## 2023-09-28 MED ORDER — AMOXICILLIN-POT CLAVULANATE 875-125 MG PO TABS: 1.0000 | ORAL_TABLET | Freq: Two times a day (BID) | ORAL | 0 refills | Status: DC

## 2023-09-28 NOTE — Patient Instructions (Signed)
Asthma exacerbation Likely triggered by dust inhalation at work. Currently on Symbicort and using Albuterol up to three times a day. -Continue Symbicort two inhalations twice a day. -Use Albuterol as needed. -Start Medrol 6-day dose pack to decrease inflammation. -Continue to wear mask at work. -Follow-up with pulmonologist on January 9th.  Nasal congestion, sinus pressure and  left TM red appears infected.(OM) Recurrent symptoms after initial improvement with Azithromycin. Nasal congestion, frontal sinus pressure, and left ear infection noted on exam. -Start Augmentin 875-125mg  for 10 days. -Continue Flonase nasal spray for decongestion. -Refill Benzonatate as needed for cough.  Allergic Rhinitis Likely contributing to current symptoms. -Continue Flonase nasal spray.  Follow up date to be determined. As needed prior to upcoming pulmonlogist appt.

## 2023-09-28 NOTE — Progress Notes (Signed)
Subjective:    Patient ID: Bobby Hobbs, male    DOB: 07/10/1981, 42 y.o.   MRN: [id number]  HPI Discussed the use of AI scribe software for clinical note transcription with the patient, who gave verbal consent to proceed.  History of Present Illness   The patient, with a history of asthma and allergies, presented with recurrent upper respiratory symptoms. He had been treated two weeks prior with a Z-Pak (azithromycin) for suspected secondary sinus infection or bronchitis, following negative COVID and flu tests. The patient reported improvement after taking the azithromycin, but symptoms returned a week later.  The patient described a persistent dry cough, nasal congestion, and frontal sinus pressure. He also reported a sensation of tightness in the chest wall and wheezing, particularly upon waking and for several hours thereafter. The patient has been managing these symptoms with Symbicort, taken twice daily, and albuterol, used up to three times daily in recent days.  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 He has been wearing a mask at work due to the dust and the recurrence of his symptoms. The patient also noted some discomfort in the left ear, described as superficial and external.  The patient has an upcoming appointment with a pulmonologist in early January. He has been adhering to his prescribed regimen of Symbicort and albuterol, with the addition of Flonase nasal spray and Benzoate as needed.        Past Medical History:  Diagnosis Date   Allergy    Asthma    Diarrhea 09/06/2019   GERD (gastroesophageal reflux disease)    Irregular heart beat    Per pt very now and then pt said he has an extra heart beat   Vomiting 09/06/2019     Social History   Socioeconomic History   Marital status: Married    Spouse name: Shanda Bumps   Number of children: 0   Years of education: Not on file    Highest education level: Not on file  Occupational History   Occupation: Film/video editor  Tobacco Use   Smoking status: Never    Passive exposure: Never   Smokeless tobacco: Never  Vaping Use   Vaping status: Never Used  Substance and Sexual Activity   Alcohol use: Yes    Alcohol/week: 0.0 standard drinks of alcohol    Comment: once a year   Drug use: No   Sexual activity: Yes  Other Topics Concern   Not on file  Social History Narrative   Not on file   Social Drivers of Health   Financial Resource Strain: Not on file  Food Insecurity: Not on file  Transportation Needs: Not on file  Physical Activity: Not on file  Stress: Not on file  Social Connections: Not on file  Intimate Partner Violence: Not on file    Past Surgical History:  Procedure Laterality Date   LAPAROSCOPIC APPENDECTOMY N/A 12/28/2014   Procedure: APPENDECTOMY LAPAROSCOPIC;  Surgeon: Darnell Level, MD;  Location: [facility];  Service: General;  Laterality: N/A;    Family History  Problem Relation Age of Onset   Hypertension Mother    Achalasia Mother    Diabetes Father    COPD Father    Diabetes Maternal Grandmother    Colon cancer Neg Hx    Esophageal cancer Neg Hx    Rectal cancer Neg Hx    Stomach cancer Neg Hx     Allergies  Allergen Reactions  Montelukast Other (See Comments)    headache   Dairy Aid [Tilactase]    Hycodan [Hydrocodone Bit-Homatrop Mbr] Nausea And Vomiting    Tolerates hydrocodone with acetaminophen    Current Outpatient Medications on File Prior to Visit  Medication Sig Dispense Refill   albuterol (VENTOLIN HFA) 108 (90 Base) MCG/ACT inhaler Use 2 inhalations every 4-6 hours as needed for cough and wheezing. 8 g 5   azelastine (ASTELIN) 0.1 % nasal spray Place 1 spray into both nostrils 2 (two) times daily. Use in each nostril as directed 30 mL 12   beclomethasone (QVAR REDIHALER) 80 MCG/ACT inhaler Inhale 2 puffs into the lungs 2 (two) times daily. 1 each 3    EPINEPHrine 0.3 mg/0.3 mL IJ SOAJ injection Inject 0.3 mg into the muscle as needed for anaphylaxis. 2 each 1   levocetirizine (XYZAL) 5 MG tablet Take 1 tablet (5 mg total) by mouth every evening. 90 tablet 0   pantoprazole (PROTONIX) 40 MG tablet Take 1 tablet (40 mg total) by mouth daily. Call [phone] to schedule an office visit for more refills 30 tablet 2   SYMBICORT 80-4.5 MCG/ACT inhaler TAKE 2 PUFFS BY MOUTH TWICE A DAY 10.2 each 5   No current facility-administered medications on file prior to visit.    BP 100/60   Pulse 98   Temp 97.6 F (36.4 C)   Resp 18   Wt 233 lb (105.7 kg)   SpO2 95%   BMI 30.74 kg/m           Review of Systems  Constitutional:  Negative for chills, fatigue and fever.  HENT:  Positive for congestion, ear pain, postnasal drip and sinus pressure. Negative for sinus pain.   Respiratory:  Positive for cough and wheezing. Negative for shortness of breath.   Cardiovascular:  Negative for chest pain and palpitations.  Gastrointestinal:  Negative for abdominal pain.  Musculoskeletal:  Negative for back pain.  Neurological:  Negative for dizziness, seizures and light-headedness.  Hematological:  Negative for adenopathy. Does not bruise/bleed easily.  Psychiatric/Behavioral:  Negative for behavioral problems and confusion.        Objective:   Physical Exam  General Mental Status- Alert. General Appearance- Not in acute distress.   Skin General: Color- Normal Color. Moisture- Normal Moisture.  Neck Carotid Arteries- Normal color. Moisture- Normal Moisture. No carotid bruits. No JVD.  Chest and Lung Exam Auscultation: Breath Sounds:-Even and unlabored but mild shallow breathing  Cardiovascular Auscultation:Rythm- RRR Murmurs & Other Heart Sounds:Auscultation of the heart reveals- No Murmurs.  Abdomen Inspection:-Inspeection Normal. Palpation/Percussion:Note:No mass. Palpation and Percussion of the abdomen reveal- Non Tender, Non  Distended + BS, no rebound or guarding.    Neurologic Cranial Nerve exam:- CN III-XII intact(No nystagmus), symmetric smile. Strength:- 5/5 equal and symmetric strength both upper and lower extremities.   Lower ext- no swelling of calves. Negative homans signs.     Assessment & Plan:  Assessment and Plan    Asthma exacerbation Likely triggered by dust inhalation at work. Currently on Symbicort and using Albuterol up to three times a day. -Continue Symbicort two inhalations twice a day. -Use Albuterol as needed. -Start Medrol 6-day dose pack to decrease inflammation. -Continue to wear mask at work. -Follow-up with pulmonologist on January 9th.  Nasal congestion, sinus pressure and  left TM red appears infected.(OM) Recurrent symptoms after initial improvement with Azithromycin. Nasal congestion, frontal sinus pressure, and left ear infection noted on exam. -Start Augmentin 875-125mg  for 10 days. -Continue Flonase  nasal spray for decongestion. -Refill Benzonatate as needed for cough.  Allergic Rhinitis Likely contributing to current symptoms. -Continue Flonase nasal spray.   Follow up date to be determined. As needed prior to upcoming pulmonlogist appt.

## 2023-10-10 ENCOUNTER — Ambulatory Visit: Payer: 59 | Admitting: Family

## 2023-10-18 ENCOUNTER — Ambulatory Visit: Payer: 59 | Admitting: Medical

## 2023-10-18 ENCOUNTER — Encounter: Payer: Self-pay | Admitting: Medical

## 2023-10-18 VITALS — BP 126/81 | HR 69 | Temp 97.6°F | Ht 73.0 in | Wt 236.0 lb

## 2023-10-18 DIAGNOSIS — R197 Diarrhea, unspecified: Secondary | ICD-10-CM | POA: Diagnosis not present

## 2023-10-18 NOTE — Progress Notes (Signed)
 Subjective:    Patient ID: Bobby Hobbs, male    DOB: 1981/01/14, 43 y.o.   MRN: [id number]  HPI Discussed the use of AI scribe software for clinical note transcription with the patient, who gave verbal consent to proceed.  History of Present Illness   The patient, on the third day of a gastrointestinal illness, reports a significant decrease in symptoms. The illness began with severe stomach cramps, vomiting, and diarrhea, which has since eased. The initial episode involved five instances of diarrhea, which has reduced to two instances on the current day, still characterized as watery. The patient denies any association with food intake but mentions a similar illness circulating at their workplace.  The patient experienced chills on the first night but denies any subsequent episodes. They also deny any fever. The patient had a course of antibiotics in December, but the current symptoms did not occur towards the end of that course.  The patient has been maintaining hydration with water, juice, and body armor drinks. They have been able to keep food down since the first day of the illness and have been consuming bland foods, including chicken and rice. The patient denies any current feelings of fatigue or dehydration.         Review of Systems  Constitutional:  Negative for chills and fatigue.  HENT:  Negative for congestion and ear pain.   Respiratory:  Negative for cough, chest tightness and wheezing.   Cardiovascular:  Negative for chest pain and palpitations.  Gastrointestinal:  Positive for diarrhea. Negative for abdominal distention, abdominal pain, blood in stool and vomiting.       See hpi  Musculoskeletal:  Negative for back pain and myalgias.  Skin:  Negative for pallor.  Neurological:  Negative for dizziness, seizures, syncope, weakness and headaches.  Hematological:  Negative for adenopathy. Does not bruise/bleed easily.  Psychiatric/Behavioral:  Negative for  behavioral problems and confusion.    Past Medical History:  Diagnosis Date   Allergy     Asthma    Diarrhea 09/06/2019   GERD (gastroesophageal reflux disease)    Irregular heart beat    Per pt very now and then pt said he has an extra heart beat   Vomiting 09/06/2019     Social History   Socioeconomic History   Marital status: Married    Spouse name: Harlene   Number of children: 0   Years of education: Not on file   Highest education level: Not on file  Occupational History   Occupation: Film/video Editor  Tobacco Use   Smoking status: Never    Passive exposure: Never   Smokeless tobacco: Never  Vaping Use   Vaping status: Never Used  Substance and Sexual Activity   Alcohol use: Yes    Alcohol/week: 0.0 standard drinks of alcohol    Comment: once a year   Drug use: No   Sexual activity: Yes  Other Topics Concern   Not on file  Social History Narrative   Not on file   Social Drivers of Health   Financial Resource Strain: Not on file  Food Insecurity: Not on file  Transportation Needs: Not on file  Physical Activity: Not on file  Stress: Not on file  Social Connections: Not on file  Intimate Partner Violence: Not on file    Past Surgical History:  Procedure Laterality Date   LAPAROSCOPIC APPENDECTOMY N/A 12/28/2014   Procedure: APPENDECTOMY LAPAROSCOPIC;  Surgeon: Krystal Spinner, MD;  Location: [facility];  Service: General;  Laterality: N/A;    Family History  Problem Relation Age of Onset   Hypertension Mother    Achalasia Mother    Diabetes Father    COPD Father    Diabetes Maternal Grandmother    Colon cancer Neg Hx    Esophageal cancer Neg Hx    Rectal cancer Neg Hx    Stomach cancer Neg Hx     Allergies  Allergen Reactions   Montelukast  Other (See Comments)    headache   Dairy Aid [Tilactase]    Hycodan [Hydrocodone  Bit-Homatrop Mbr] Nausea And Vomiting    Tolerates hydrocodone  with acetaminophen     Current Outpatient Medications on File  Prior to Visit  Medication Sig Dispense Refill   albuterol  (VENTOLIN  HFA) 108 (90 Base) MCG/ACT inhaler Use 2 inhalations every 4-6 hours as needed for cough and wheezing. 8 g 5   azelastine  (ASTELIN ) 0.1 % nasal spray Place 1 spray into both nostrils 2 (two) times daily. Use in each nostril as directed 30 mL 12   beclomethasone (QVAR  REDIHALER) 80 MCG/ACT inhaler Inhale 2 puffs into the lungs 2 (two) times daily. 1 each 3   EPINEPHrine  0.3 mg/0.3 mL IJ SOAJ injection Inject 0.3 mg into the muscle as needed for anaphylaxis. 2 each 1   levocetirizine (XYZAL ) 5 MG tablet Take 1 tablet (5 mg total) by mouth every evening. 90 tablet 0   pantoprazole  (PROTONIX ) 40 MG tablet Take 1 tablet (40 mg total) by mouth daily. Call [phone] to schedule an office visit for more refills 30 tablet 2   SYMBICORT  80-4.5 MCG/ACT inhaler TAKE 2 PUFFS BY MOUTH TWICE A DAY 10.2 each 5   No current facility-administered medications on file prior to visit.    BP 126/81   Pulse 69   Temp 97.6 F (36.4 C)   Ht 6' 1 (1.854 m)   Wt 236 lb (107 kg)   SpO2 98%   BMI 31.14 kg/m        Objective:   Physical Exam  General Mental Status- Alert. General Appearance- Not in acute distress.   Skin moist  Neck Carotid Arteries- Normal color. Moisture- Normal Moisture. No carotid bruits. No JVD.  Chest and Lung Exam Auscultation: Breath Sounds:-CTA  Cardiovascular Auscultation:Rythm- RRR Murmurs & Other Heart Sounds:Auscultation of the heart reveals- No Murmurs.  Abdomen Inspection:-Inspeection Normal. Palpation/Percussion:Note:No mass. Palpation and Percussion of the abdomen reveal- faint epigastric Tender, Non Distended + BS, no rebound or guarding.  Neurologic Cranial Nerve exam:- CN III-XII intact(No nystagmus), symmetric smile. Strength:- 5/5 equal and symmetric strength both upper and lower extremities.       Assessment & Plan:   Patient Instructions  Acute Gastroenteritis Day 3 of watery  diarrhea, initially 5 times per day, now twice per day. Initial vomiting and chills, but no fever. No clear food association, but possible exposure at workplace. Recent antibiotic use in December, but no diarrhea during or immediately after course. Maintaining hydration with water and body armor drinks. -Continue hydration and bland diet. -Consider use of Imodium if residual loose stools persist upon return to work. -If symptoms worsen, submit stool sample for OMP and C. diff testing. -Return to work on Friday, 10/20/2023, if symptoms continue to improve.    Chloey Ricard, PA-C

## 2023-10-18 NOTE — Addendum Note (Signed)
 Addended by: Mervin Kung A on: 10/18/2023 09:40 AM   Modules accepted: Orders

## 2023-10-18 NOTE — Patient Instructions (Signed)
 Acute Gastroenteritis Day 3 of watery diarrhea, initially 5 times per day, now twice per day. Initial vomiting and chills, but no fever. No clear food association, but possible exposure at workplace. Recent antibiotic use in December, but no diarrhea during or immediately after course. Maintaining hydration with water and body armor drinks. -Continue hydration and bland diet. -Consider use of Imodium if residual loose stools persist upon return to work. -If symptoms worsen, submit stool sample for OMP and C. diff testing. -Return to work on Friday, 10/20/2023, if symptoms continue to improve.

## 2023-10-27 ENCOUNTER — Ambulatory Visit: Payer: 59 | Admitting: Family Medicine

## 2023-10-27 ENCOUNTER — Encounter: Payer: Self-pay | Admitting: Family Medicine

## 2023-10-27 VITALS — BP 106/75 | HR 87 | Ht 73.0 in | Wt 237.0 lb

## 2023-10-27 DIAGNOSIS — M25562 Pain in left knee: Secondary | ICD-10-CM

## 2023-10-27 DIAGNOSIS — G8929 Other chronic pain: Secondary | ICD-10-CM

## 2023-10-27 MED ORDER — CELECOXIB 100 MG PO CAPS: 100.0000 mg | ORAL_CAPSULE | Freq: Two times a day (BID) | ORAL | 0 refills | Status: DC

## 2023-10-27 NOTE — Progress Notes (Signed)
Acute Office Visit  Subjective:     Patient ID: Bobby Hobbs, male    DOB: 1981-01-27, 43 y.o.   MRN: [id number]  Chief Complaint  Patient presents with   Knee Pain     Patient is in today for knee pain.   Discussed the use of AI scribe software for clinical note transcription with the patient, who gave verbal consent to proceed.  History of Present Illness   The patient, with a history of chronic left knee issues, presents with an exacerbation of symptoms following a fall on ice while walking their dogs. The patient describes the fall as a twisting motion, with the left ankle moving outward and the knee moving inward. The pain, initially severe, has improved but persists, rated as a 3 out of 10, with occasional popping, a symptom the patient has experienced prior to this incident.  The patient managed the initial swelling with ice, which resolved within the first 24 hours. They have been self-treating the ongoing pain with over-the-counter Advil. The patient reports that the pain is primarily located on the front of the knee, with no discomfort in the back of the knee or the hamstrings.  The patient has a history of suspected meniscal pathology, diagnosed by a sports medicine specialist. They have been managing this with Celebrex and a home exercise program. The patient also uses a compressive brace for support.  The patient's knee issues have impacted their work, causing them to miss two days and necessitating lighter duties upon return. The patient has a physically demanding job, which includes potential altercations with inmates.             ROS All review of systems negative except what is listed in the HPI      Objective:    BP 106/75   Pulse 87   Ht 6\' 1"  (1.854 m)   Wt 237 lb (107.5 kg)   SpO2 98%   BMI 31.27 kg/m    Physical Exam Vitals reviewed.  Constitutional:      Appearance: Normal appearance.  Musculoskeletal:        General: No  swelling or tenderness. Normal range of motion.  Skin:    General: Skin is warm and dry.  Neurological:     Mental Status: He is alert and oriented to person, place, and time.  Psychiatric:        Mood and Affect: Mood normal.        Behavior: Behavior normal.        Thought Content: Thought content normal.        Judgment: Judgment normal.      No results found for any visits on 10/27/23.      Assessment & Plan:   Problem List Items Addressed This Visit   None Visit Diagnoses       Chronic pain of left knee    -  Primary   Relevant Medications   celecoxib (CELEBREX) 100 MG capsule     Acute exacerbation of chronic knee pain after a fall on ice. No current swelling, but reports of popping and pain, particularly on the medial aspect of the knee. No new structural changes noted on examination. -Continue home exercises, heat, and ice as tolerated. -Refill Celebrex prescription and take as directed for pain. -Use knee brace for support, particularly during work. -If no improvement in 2-4 weeks, consider follow-up with Dr. Jean Rosenthal (Sports Medicine)       Meds ordered this encounter  Medications  celecoxib (CELEBREX) 100 MG capsule    Sig: Take 1 capsule (100 mg total) by mouth 2 (two) times daily.    Dispense:  60 capsule    Refill:  0    Supervising Provider:   Danise Edge A [[id number]]    Return if symptoms worsen or fail to improve.  Clayborne Dana, NP

## 2023-10-30 ENCOUNTER — Encounter: Payer: Self-pay | Admitting: Family Medicine

## 2023-10-30 ENCOUNTER — Ambulatory Visit: Payer: 59 | Admitting: Family Medicine

## 2023-10-30 VITALS — BP 110/68 | HR 68 | Temp 97.7°F | Resp 18 | Ht 73.0 in | Wt 238.6 lb

## 2023-10-30 DIAGNOSIS — J029 Acute pharyngitis, unspecified: Secondary | ICD-10-CM

## 2023-10-30 DIAGNOSIS — R051 Acute cough: Secondary | ICD-10-CM

## 2023-10-30 DIAGNOSIS — R6889 Other general symptoms and signs: Secondary | ICD-10-CM | POA: Diagnosis not present

## 2023-10-30 LAB — POC COVID19 BINAXNOW: SARS Coronavirus 2 Ag: NEGATIVE

## 2023-10-30 LAB — POCT INFLUENZA A/B
Influenza A, POC: NEGATIVE
Influenza B, POC: NEGATIVE

## 2023-10-30 LAB — POCT RAPID STREP A (OFFICE): Rapid Strep A Screen: NEGATIVE

## 2023-10-30 MED ORDER — AMOXICILLIN 875 MG PO TABS: 875.0000 mg | ORAL_TABLET | Freq: Two times a day (BID) | ORAL | 0 refills | Status: AC

## 2023-10-30 MED ORDER — OSELTAMIVIR PHOSPHATE 75 MG PO CAPS: 75.0000 mg | ORAL_CAPSULE | Freq: Two times a day (BID) | ORAL | 0 refills | Status: DC

## 2023-10-30 NOTE — Progress Notes (Signed)
Subjective:    Patient ID: Bobby Hobbs, male    DOB: 05-07-1981, 43 y.o.   MRN: [id number]  Chief Complaint  Patient presents with   Sore Throat    Sxs started last night, pt states having cough, pain with swallowing, no otc meds    HPI Patient is in today for sore throat.  Discussed the use of AI scribe software for clinical note transcription with the patient, who gave verbal consent to proceed.  History of Present Illness   The patient, who works in a jail, presented with sudden onset of symptoms that began the previous night. He reported feeling 'terrible' with body aches and a fever of 101.37F. He also experienced chills and a sporadic cough. The patient noted a slight sore throat and some congestion, accompanied by a sinus headache. He reported no issues with his ears.  The patient had performed a home COVID-19 test due to his work environment, which returned a negative result. He also mentioned a history of sinus infections, but did not believe his current symptoms were indicative of such. The patient has a history of multiple injuries and illnesses, including having broken 22 bones, mostly during his childhood.  Despite the negative COVID-19, flu, and strep tests, the patient's symptoms were consistent with the flu.       Past Medical History:  Diagnosis Date   Allergy    Asthma    Diarrhea 09/06/2019   GERD (gastroesophageal reflux disease)    Irregular heart beat    Per pt very now and then pt said he has an extra heart beat   Vomiting 09/06/2019    Past Surgical History:  Procedure Laterality Date   LAPAROSCOPIC APPENDECTOMY N/A 12/28/2014   Procedure: APPENDECTOMY LAPAROSCOPIC;  Surgeon: Darnell Level, MD;  Location: [facility];  Service: General;  Laterality: N/A;    Family History  Problem Relation Age of Onset   Hypertension Mother    Achalasia Mother    Diabetes Father    COPD Father    Diabetes Maternal Grandmother    Colon cancer Neg Hx     Esophageal cancer Neg Hx    Rectal cancer Neg Hx    Stomach cancer Neg Hx     Social History   Socioeconomic History   Marital status: Married    Spouse name: Shanda Bumps   Number of children: 0   Years of education: Not on file   Highest education level: Not on file  Occupational History   Occupation: Film/video editor  Tobacco Use   Smoking status: Never    Passive exposure: Never   Smokeless tobacco: Never  Vaping Use   Vaping status: Never Used  Substance and Sexual Activity   Alcohol use: Yes    Alcohol/week: 0.0 standard drinks of alcohol    Comment: once a year   Drug use: No   Sexual activity: Yes  Other Topics Concern   Not on file  Social History Narrative   Not on file   Social Drivers of Health   Financial Resource Strain: Not on file  Food Insecurity: Not on file  Transportation Needs: Not on file  Physical Activity: Not on file  Stress: Not on file  Social Connections: Not on file  Intimate Partner Violence: Not on file    Outpatient Medications Prior to Visit  Medication Sig Dispense Refill   albuterol (VENTOLIN HFA) 108 (90 Base) MCG/ACT inhaler Use 2 inhalations every 4-6 hours as needed for cough and wheezing. 8  g 5   azelastine (ASTELIN) 0.1 % nasal spray Place 1 spray into both nostrils 2 (two) times daily. Use in each nostril as directed 30 mL 12   beclomethasone (QVAR REDIHALER) 80 MCG/ACT inhaler Inhale 2 puffs into the lungs 2 (two) times daily. 1 each 3   celecoxib (CELEBREX) 100 MG capsule Take 1 capsule (100 mg total) by mouth 2 (two) times daily. 60 capsule 0   EPINEPHrine 0.3 mg/0.3 mL IJ SOAJ injection Inject 0.3 mg into the muscle as needed for anaphylaxis. 2 each 1   levocetirizine (XYZAL) 5 MG tablet Take 1 tablet (5 mg total) by mouth every evening. 90 tablet 0   pantoprazole (PROTONIX) 40 MG tablet Take 1 tablet (40 mg total) by mouth daily. Hobbs [phone] to schedule an office visit for more refills 30 tablet 2   SYMBICORT 80-4.5  MCG/ACT inhaler TAKE 2 PUFFS BY MOUTH TWICE A DAY 10.2 each 5   No facility-administered medications prior to visit.    Allergies  Allergen Reactions   Montelukast Other (See Comments)    headache   Dairy Aid [Tilactase]    Hycodan [Hydrocodone Bit-Homatrop Mbr] Nausea And Vomiting    Tolerates hydrocodone with acetaminophen    Review of Systems  Constitutional:  Positive for chills, fever and malaise/fatigue.  HENT:  Positive for congestion. Negative for sinus pain.   Eyes:  Negative for blurred vision.  Respiratory:  Positive for cough. Negative for shortness of breath.   Cardiovascular:  Negative for chest pain, palpitations and leg swelling.  Gastrointestinal:  Negative for abdominal pain, blood in stool and nausea.  Genitourinary:  Negative for dysuria and frequency.  Musculoskeletal:  Negative for falls.  Skin:  Negative for rash.  Neurological:  Negative for dizziness, loss of consciousness and headaches.  Endo/Heme/Allergies:  Negative for environmental allergies.  Psychiatric/Behavioral:  Negative for depression. The patient is not nervous/anxious.        Objective:    Physical Exam Vitals and nursing note reviewed.  Constitutional:      General: He is not in acute distress.    Appearance: Normal appearance.  HENT:     Head: Normocephalic and atraumatic.     Right Ear: Tympanic membrane, ear canal and external ear normal. There is no impacted cerumen.     Left Ear: Tympanic membrane, ear canal and external ear normal. There is no impacted cerumen.     Nose: Congestion and rhinorrhea present.     Right Sinus: No maxillary sinus tenderness or frontal sinus tenderness.     Left Sinus: No maxillary sinus tenderness or frontal sinus tenderness.     Mouth/Throat:     Mouth: Mucous membranes are moist.     Pharynx: Oropharynx is clear. No oropharyngeal exudate or posterior oropharyngeal erythema.  Eyes:     General: No scleral icterus.       Right eye: No discharge.         Left eye: No discharge.     Conjunctiva/sclera: Conjunctivae normal.  Cardiovascular:     Rate and Rhythm: Normal rate and regular rhythm.     Heart sounds: Normal heart sounds.  Pulmonary:     Effort: Pulmonary effort is normal. No respiratory distress.     Breath sounds: Normal breath sounds.  Musculoskeletal:     Cervical back: Normal range of motion.  Lymphadenopathy:     Cervical: No cervical adenopathy.  Skin:    General: Skin is warm and dry.  Neurological:  Mental Status: He is alert and oriented to person, place, and time.  Psychiatric:        Mood and Affect: Mood normal.        Behavior: Behavior normal.        Thought Content: Thought content normal.        Judgment: Judgment normal.    BP 110/68 (BP Location: Left Arm, Patient Position: Sitting, Cuff Size: Normal)   Pulse 68   Temp 97.7 F (36.5 C) (Oral)   Resp 18   Ht 6\' 1"  (1.854 m)   Wt 238 lb 9.6 oz (108.2 kg)   SpO2 98%   BMI 31.48 kg/m  Wt Readings from Last 3 Encounters:  10/30/23 238 lb 9.6 oz (108.2 kg)  10/27/23 237 lb (107.5 kg)  10/18/23 236 lb (107 kg)    Diabetic Foot Exam - Simple   No data filed    Lab Results  Component Value Date   WBC 11.0 (A) 12/16/2016   HGB 17.0 12/16/2016   HCT 49.4 12/16/2016   PLT 202 12/30/2014   GLUCOSE 86 12/16/2016   ALT 38 12/26/2014   AST 65 (H) 12/26/2014   NA 140 12/16/2016   K 3.9 12/16/2016   CL 97 12/16/2016   CREATININE 1.07 12/16/2016   BUN 9 12/16/2016   CO2 24 12/16/2016   HGBA1C 5.7 08/03/2023    No results found for: "TSH" Lab Results  Component Value Date   WBC 11.0 (A) 12/16/2016   HGB 17.0 12/16/2016   HCT 49.4 12/16/2016   MCV 82.5 12/16/2016   PLT 202 12/30/2014   Lab Results  Component Value Date   NA 140 12/16/2016   K 3.9 12/16/2016   CO2 24 12/16/2016   GLUCOSE 86 12/16/2016   BUN 9 12/16/2016   CREATININE 1.07 12/16/2016   BILITOT 0.7 12/26/2014   ALKPHOS 53 12/26/2014   AST 65 (H) 12/26/2014    ALT 38 12/26/2014   PROT 6.8 12/26/2014   ALBUMIN 4.1 12/26/2014   CALCIUM 9.4 12/16/2016   ANIONGAP 8 12/31/2014   No results found for: "CHOL" No results found for: "HDL" No results found for: "LDLCALC" No results found for: "TRIG" No results found for: "CHOLHDL" Lab Results  Component Value Date   HGBA1C 5.7 08/03/2023       Assessment & Plan:  Acute cough -     POC COVID-19 BinaxNow -     POCT Influenza A/B  Sore throat -     POCT rapid strep A -     Amoxicillin; Take 1 tablet (875 mg total) by mouth 2 (two) times daily for 10 days.  Dispense: 20 tablet; Refill: 0  Flu-like symptoms -     Oseltamivir Phosphate; Take 1 capsule (75 mg total) by mouth 2 (two) times daily.  Dispense: 10 capsule; Refill: 0  Assessment and Plan    Influenza-like Illness Acute onset of myalgia, chills, fever (101.95F), pharyngitis, nasal congestion, and sporadic cough since last night. COVID-19, influenza, and strep tests were negative, but symptoms align with influenza. Occupation in a jail increases exposure risk. Early treatment discussed to reduce symptom duration and severity, with an explanation of potential symptom persistence and worsening if untreated. Initiate antiviral treatment for influenza. Return if symptoms do not improve or worsen.        Donato Schultz, DO

## 2024-01-01 ENCOUNTER — Ambulatory Visit: Payer: 59 | Admitting: Pulmonary Disease

## 2024-01-01 ENCOUNTER — Encounter: Payer: Self-pay | Admitting: Pulmonary Disease

## 2024-01-01 DIAGNOSIS — J4540 Moderate persistent asthma, uncomplicated: Secondary | ICD-10-CM

## 2024-01-01 DIAGNOSIS — J209 Acute bronchitis, unspecified: Secondary | ICD-10-CM

## 2024-01-01 MED ORDER — BUDESONIDE-FORMOTEROL FUMARATE 80-4.5 MCG/ACT IN AERO: 2.0000 | INHALATION_SPRAY | Freq: Two times a day (BID) | RESPIRATORY_TRACT | 5 refills | Status: DC

## 2024-01-01 MED ORDER — ALBUTEROL SULFATE HFA 108 (90 BASE) MCG/ACT IN AERS
INHALATION_SPRAY | RESPIRATORY_TRACT | 5 refills | Status: AC
Start: 2024-01-01 — End: ?

## 2024-01-01 NOTE — Progress Notes (Signed)
 Bobby Hobbs    [id number]    [birth year]  Primary Care Physician:Saguier, Kateri Mc  Referring Physician: Esperanza Richters, PA-C [address] [place],  [place] [address]  Chief complaint:   Patient with a history of asthma In for routine follow-up  HPI:  Has been doing relatively well  Good year so far  Last year did have some respiratory infections but overall doing okay  COVID in January 2022, recovered after a while  Asthma remained stable with use of Symbicort  Needs albuterol about once a week however does use albuterol prior to significant exertion  Asthma is lifelong Only recently started having more problems with it Does not recollect recent exposure or changes in his immediate environment No pets  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   azelastine (ASTELIN) 0.1 % nasal spray Place 1 spray into both nostrils 2 (two) times daily. Use in each nostril as directed   beclomethasone (QVAR REDIHALER) 80 MCG/ACT inhaler Inhale 2 puffs into the lungs 2 (two) times daily.   celecoxib (CELEBREX) 100 MG capsule Take 1 capsule (100 mg total) by mouth 2 (two) times daily.   EPINEPHrine 0.3 mg/0.3 mL IJ SOAJ injection Inject 0.3 mg into the muscle as needed for anaphylaxis.   levocetirizine (XYZAL) 5 MG tablet Take 1 tablet (5 mg total) by mouth every evening.   pantoprazole (PROTONIX) 40 MG tablet Take 1 tablet (40 mg total) by mouth daily. Call [phone] to schedule an office visit for more refills   SYMBICORT 80-4.5 MCG/ACT inhaler TAKE 2 PUFFS BY MOUTH TWICE A DAY    oseltamivir (TAMIFLU) 75 MG capsule Take 1 capsule (75 mg total) by mouth 2 (two) times daily. (Patient not taking: Reported on 01/01/2024)   No facility-administered encounter medications on file as of 01/01/2024.    Allergies as of 01/01/2024 - Review Complete 01/01/2024  Allergen Reaction Noted   Montelukast Other (See Comments) 12/17/2019   Dairy aid [tilactase]  12/26/2014   Hycodan [hydrocodone bit-homatrop mbr] Nausea And Vomiting 12/19/2011    Past Medical History:  Diagnosis Date   Allergy    Asthma    Diarrhea 09/06/2019   GERD (gastroesophageal reflux disease)    Irregular heart beat    Per pt very now and then pt said he has an extra heart beat   Vomiting 09/06/2019    Past Surgical History:  Procedure Laterality Date   LAPAROSCOPIC APPENDECTOMY N/A 12/28/2014   Procedure: APPENDECTOMY LAPAROSCOPIC;  Surgeon: Darnell Level, MD;  Location: [facility];  Service: General;  Laterality: N/A;    Family History  Problem Relation Age of Onset   Hypertension Mother    Achalasia Mother    Diabetes Father    COPD Father    Diabetes Maternal Grandmother    Colon cancer Neg Hx    Esophageal cancer Neg Hx    Rectal cancer Neg Hx    Stomach cancer Neg Hx     Social History   Socioeconomic History   Marital status: Married    Spouse name: Shanda Bumps  Number of children: 0   Years of education: Not on file   Highest education level: Not on file  Occupational History   Occupation: Film/video editor  Tobacco Use   Smoking status: Never    Passive exposure: Never   Smokeless tobacco: Never  Vaping Use   Vaping status: Never Used  Substance and Sexual Activity   Alcohol use: Yes    Alcohol/week: 0.0 standard drinks of alcohol    Comment: once a year   Drug use: No   Sexual activity: Yes  Other Topics Concern   Not on file  Social History Narrative   Not on file   Social Drivers of Health   Financial Resource Strain: Not on file  Food Insecurity: Not on file   Transportation Needs: Not on file  Physical Activity: Not on file  Stress: Not on file  Social Connections: Not on file  Intimate Partner Violence: Not on file    Review of Systems  Constitutional: Negative.  Negative for fatigue.  HENT: Negative.    Eyes: Negative.   Respiratory:  Positive for shortness of breath. Negative for wheezing.   Cardiovascular: Negative.   Gastrointestinal: Negative.   Genitourinary: Negative.   Musculoskeletal: Negative.     Vitals:   01/01/24 0904  BP: 117/83  Pulse: 67  SpO2: 97%     Physical Exam Constitutional:      Appearance: He is well-developed.  HENT:     Head: Normocephalic and atraumatic.     Mouth/Throat:     Mouth: Mucous membranes are moist.  Eyes:     General:        Right eye: No discharge.        Left eye: No discharge.  Neck:     Thyroid: No thyromegaly.     Trachea: No tracheal deviation.  Cardiovascular:     Rate and Rhythm: Normal rate and regular rhythm.  Pulmonary:     Effort: Pulmonary effort is normal. No respiratory distress.     Breath sounds: No stridor. No wheezing, rhonchi or rales.  Musculoskeletal:     Cervical back: No rigidity or tenderness.  Neurological:     Mental Status: He is alert and oriented to person, place, and time.     Cranial Nerves: No cranial nerve deficit.  Psychiatric:        Mood and Affect: Mood normal.    Data Reviewed: Last chest x-ray on record is 11/24/2020 showing no acute infiltrate  Assessment:   Moderate persistent asthma -Symptoms controlled with Symbicort -Infrequent use of albuterol  Class I obesity -Continues to work on weight loss efforts  Plan/Recommendations: Continue Symbicort  Albuterol use as needed  Call with significant concerns  Follow-up a year from now   Virl Diamond MD [facility] 01/01/2024, 9:26 AM  CC: Esperanza Richters, PA-C

## 2024-01-01 NOTE — Patient Instructions (Signed)
 Follow-up a year from now  Will make sure I send in refills for your inhalers  Call us with significant concerns

## 2024-01-24 ENCOUNTER — Ambulatory Visit: Admitting: Medical

## 2024-01-24 VITALS — BP 126/84 | HR 68 | Temp 98.2°F | Resp 18 | Ht 73.0 in | Wt 237.0 lb

## 2024-01-24 DIAGNOSIS — J309 Allergic rhinitis, unspecified: Secondary | ICD-10-CM

## 2024-01-24 DIAGNOSIS — M791 Myalgia, unspecified site: Secondary | ICD-10-CM

## 2024-01-24 DIAGNOSIS — J0100 Acute maxillary sinusitis, unspecified: Secondary | ICD-10-CM

## 2024-01-24 LAB — POCT INFLUENZA A/B
Influenza A, POC: NEGATIVE
Influenza B, POC: NEGATIVE

## 2024-01-24 MED ORDER — AZITHROMYCIN 250 MG PO TABS: ORAL_TABLET | ORAL | 0 refills | Status: AC

## 2024-01-24 MED ORDER — FLUTICASONE PROPIONATE 50 MCG/ACT NA SUSP: 2.0000 | Freq: Every day | NASAL | 1 refills | Status: AC

## 2024-01-24 MED ORDER — OLOPATADINE HCL 0.1 % OP SOLN: 1.0000 [drp] | Freq: Two times a day (BID) | OPHTHALMIC | 1 refills | Status: DC

## 2024-01-24 MED ORDER — METHYLPREDNISOLONE 4 MG PO TABS: ORAL_TABLET | ORAL | 0 refills | Status: DC

## 2024-01-24 NOTE — Patient Instructions (Signed)
 Sinusitis Acute sinusitis likely secondary to allergic rhinitis. Negative COVID test, flu test negative as well. - Prescribe azithromycin. - Continue Xyzal. - Add Flonase nasal spray. - Provide Medrol dose pack if needed over long easter weekend. - Perform flu test and prescribe Tamiflu if positive.  Allergic Rhinitis Chronic allergic rhinitis with seasonal exacerbations. Previously managed with Xyzal and Flonase. - Continue Xyzal. - Add Flonase nasal spray. - Prescribe aloptidine eye drops.  Asthma Asthma stable with no current wheezing or exacerbation.  Follow-up Monitor symptom resolution and response to treatment. - Follow up in 7-10 days if symptoms persist.

## 2024-01-24 NOTE — Progress Notes (Signed)
 Subjective:    Patient ID: Bobby Hobbs, male    DOB: [birth year], 43 y.o.   MRN: [id number]  HPI  Bobby Hobbs is a 43 year old male with a history of seasonal allergies and asthma who presents with symptoms of a sinus infection.  He has been experiencing symptoms for five to six days, beginning last week. Initially, the symptoms resembled his usual allergies but progressed to include sinus congestion and facial pain. He describes a runny nose, nausea from drainage, and facial pressure, along with colored mucus when blowing his nose.  At onset had sneezing, itcing eyes, runny nose and nasal congestion. Then eventually had sinus pressure.  He experiences these symptoms approximately twice a year. He has been taking Xyzal for his allergies and was previously using Flonase nasal spray, which is no longer being refilled. He has stopped using Astelin. No wheezing is present, and his asthma has not been an issue since it was updated in February.  He woke up sweaty the previous day but has not recorded an actual fever. He experienced body aches yesterday, which he associates with fever. He has taken a COVID test, which was negative, but has not tested for the flu. He mentions a mild, dry cough that occurs infrequently, about once every forty days.  His allergy symptoms are exacerbated by environmental factors, such as pollen, as evidenced by his observation of his black truck being covered in yellow pollen.     Review of Systems  Constitutional:  Negative for chills, fatigue and fever.       Woke up sweaty yesterday morning just one day.  HENT:  Positive for congestion, postnasal drip, sinus pressure, sinus pain and sneezing.   Eyes:  Positive for itching.  Respiratory:  Positive for cough. Negative for chest tightness, shortness of breath and wheezing.   Cardiovascular:  Negative for chest pain and palpitations.  Genitourinary:  Negative for dysuria.  Musculoskeletal:   Positive for myalgias. Negative for back pain.  Skin:  Negative for rash.  Neurological:  Negative for dizziness, speech difficulty and light-headedness.  Hematological:  Negative for adenopathy. Does not bruise/bleed easily.  Psychiatric/Behavioral:  Negative for behavioral problems and decreased concentration. The patient is not nervous/anxious.     Past Medical History:  Diagnosis Date   Allergy    Asthma    Diarrhea 09/06/2019   GERD (gastroesophageal reflux disease)    Irregular heart beat    Per pt very now and then pt said he has an extra heart beat   Vomiting 09/06/2019     Social History   Socioeconomic History   Marital status: Married    Spouse name: Shanda Bumps   Number of children: 0   Years of education: Not on file   Highest education level: Not on file  Occupational History   Occupation: Film/video editor  Tobacco Use   Smoking status: Never    Passive exposure: Never   Smokeless tobacco: Never  Vaping Use   Vaping status: Never Used  Substance and Sexual Activity   Alcohol use: Yes    Alcohol/week: 0.0 standard drinks of alcohol    Comment: once a year   Drug use: No   Sexual activity: Yes  Other Topics Concern   Not on file  Social History Narrative   Not on file   Social Drivers of Health   Financial Resource Strain: Not on file  Food Insecurity: Not on file  Transportation Needs: Not on file  Physical Activity: Not  on file  Stress: Not on file  Social Connections: Not on file  Intimate Partner Violence: Not on file    Past Surgical History:  Procedure Laterality Date   LAPAROSCOPIC APPENDECTOMY N/A 12/28/2014   Procedure: APPENDECTOMY LAPAROSCOPIC;  Surgeon: Oralee Billow, MD;  Location: [facility];  Service: General;  Laterality: N/A;    Family History  Problem Relation Age of Onset   Hypertension Mother    Achalasia Mother    Diabetes Father    COPD Father    Diabetes Maternal Grandmother    Colon cancer Neg Hx    Esophageal cancer Neg  Hx    Rectal cancer Neg Hx    Stomach cancer Neg Hx     Allergies  Allergen Reactions   Montelukast Other (See Comments)    headache   Dairy Aid [Tilactase]    Hycodan [Hydrocodone Bit-Homatrop Mbr] Nausea And Vomiting    Tolerates hydrocodone with acetaminophen    Current Outpatient Medications on File Prior to Visit  Medication Sig Dispense Refill   albuterol (VENTOLIN HFA) 108 (90 Base) MCG/ACT inhaler Use 2 inhalations every 4-6 hours as needed for cough and wheezing. 8 g 5   azelastine (ASTELIN) 0.1 % nasal spray Place 1 spray into both nostrils 2 (two) times daily. Use in each nostril as directed 30 mL 12   beclomethasone (QVAR REDIHALER) 80 MCG/ACT inhaler Inhale 2 puffs into the lungs 2 (two) times daily. 1 each 3   budesonide-formoterol (SYMBICORT) 80-4.5 MCG/ACT inhaler Inhale 2 puffs into the lungs 2 (two) times daily. 10.2 each 5   celecoxib (CELEBREX) 100 MG capsule Take 1 capsule (100 mg total) by mouth 2 (two) times daily. 60 capsule 0   EPINEPHrine 0.3 mg/0.3 mL IJ SOAJ injection Inject 0.3 mg into the muscle as needed for anaphylaxis. 2 each 1   levocetirizine (XYZAL) 5 MG tablet Take 1 tablet (5 mg total) by mouth every evening. 90 tablet 0   oseltamivir (TAMIFLU) 75 MG capsule Take 1 capsule (75 mg total) by mouth 2 (two) times daily. (Patient not taking: Reported on 01/01/2024) 10 capsule 0   pantoprazole (PROTONIX) 40 MG tablet Take 1 tablet (40 mg total) by mouth daily. Call [phone] to schedule an office visit for more refills 30 tablet 2   No current facility-administered medications on file prior to visit.    BP 126/84   Pulse 68   Temp 98.2 F (36.8 C)   Resp 18   Ht 6\' 1"  (1.854 m)   Wt 237 lb (107.5 kg)   SpO2 98%   BMI 31.27 kg/m        Objective:   Physical Exam  General Mental Status- Alert. General Appearance- Not in acute distress.   Skin General: Color- Normal Color. Moisture- Normal Moisture.  Neck Carotid Arteries- Normal  color. Moisture- Normal Moisture. No carotid bruits. No JVD.  Chest and Lung Exam Auscultation: Breath Sounds:-CTA  Cardiovascular Auscultation:Rythm- RRR Murmurs & Other Heart Sounds:Auscultation of the heart reveals- No Murmurs.  Abdomen Inspection:-Inspeection Normal. Palpation/Percussion:Note:No mass. Palpation and Percussion of the abdomen reveal- Non Tender, Non Distended + BS, no rebound or guarding.   Neurologic Cranial Nerve exam:- CN III-XII intact(No nystagmus), symmetric smile. Strength:- 5/5 equal and symmetric strength both upper and lower extremities.   Heent- boggy turbintes, +pnd, maxillary sinus pressure. Ears canals clear. Rt tm normal. Left tm mild tiny red area in center of tm.     Assessment & Plan:  Sinusitis Acute sinusitis likely secondary  to allergic rhinitis. Negative COVID test, flu test negative as well. - Prescribe azithromycin. - Continue Xyzal. - Add Flonase nasal spray. - Provide Medrol dose pack if needed over long easter weekend. - Perform flu test and prescribe Tamiflu if positive.  Allergic Rhinitis Chronic allergic rhinitis with seasonal exacerbations. Previously managed with Xyzal and Flonase. - Continue Xyzal. - Add Flonase nasal spray. - Prescribe aloptidine eye drops.  Asthma Asthma stable with no current wheezing or exacerbation.  Follow-up Monitor symptom resolution and response to treatment. - Follow up in 7-10 days if symptoms persist.

## 2024-01-28 ENCOUNTER — Other Ambulatory Visit: Payer: Self-pay | Admitting: Medical

## 2024-03-05 ENCOUNTER — Ambulatory Visit: Admitting: Family Medicine

## 2024-03-05 ENCOUNTER — Encounter: Payer: Self-pay | Admitting: Family Medicine

## 2024-03-05 VITALS — BP 97/66 | HR 77 | Temp 97.7°F | Ht 73.0 in | Wt 240.0 lb

## 2024-03-05 DIAGNOSIS — J0100 Acute maxillary sinusitis, unspecified: Secondary | ICD-10-CM | POA: Diagnosis not present

## 2024-03-05 MED ORDER — DOXYCYCLINE HYCLATE 100 MG PO CAPS: 100.0000 mg | ORAL_CAPSULE | Freq: Two times a day (BID) | ORAL | 0 refills | Status: AC

## 2024-03-05 NOTE — Progress Notes (Signed)
 Acute Office Visit  Subjective:     Patient ID: Bobby Hobbs, male    DOB: July 26, 1981, 43 y.o.   MRN: [id number]  Chief Complaint  Patient presents with   Sinus Problem    HPI Patient is in today for sinus pain, URI symptoms.    Discussed the use of AI scribe software for clinical note transcription with the patient, who gave verbal consent to proceed.  History of Present Illness Bobby Hobbs is a 43 year old male who presents with worsening sinus congestion and related symptoms.  He has been experiencing sinus congestion for about a week, which he describes as worsening. Initially, he had sinus congestion, pressure, and a runny nose. He also has a cough due to drainage, which feels more like clearing his throat rather than coming from his lungs. No fever is present, but he mentions vomiting once the previous day after waking up, which resolved quickly. He experiences a sore throat upon waking, which subsides shortly after. No ear pain is reported, but there is a dull ache under his eyes.  He has been using Flonase  and Xyzal  for his symptoms and has tried Mucinex  without much effect. He takes Xyzal  year-round and previously switched from Claritin due to decreased effectiveness. Allergy  testing last year suggested allergy  shots, but he declined due to cost.   He works in an [employer], which he believes exacerbates his symptoms. He works nights and has missed work due to his symptoms, including the previous night. He reports drinking fluids like apple juice to stay hydrated.         ROS All review of systems negative except what is listed in the HPI      Objective:    BP 97/66   Pulse 77   Temp 97.7 F (36.5 C) (Oral)   Ht 6\' 1"  (1.854 m)   Wt 240 lb (108.9 kg)   SpO2 99%   BMI 31.66 kg/m    Physical Exam Vitals reviewed.  Constitutional:      Appearance: Normal appearance.  HENT:     Head: Normocephalic and  atraumatic.     Comments: Maxillary sinuses tender to palpation     Right Ear: Tympanic membrane normal. There is no impacted cerumen.     Left Ear: Tympanic membrane normal. There is no impacted cerumen.     Nose: Congestion and rhinorrhea present.     Right Turbinates: Swollen.     Left Turbinates: Swollen.     Mouth/Throat:     Pharynx: No oropharyngeal exudate or posterior oropharyngeal erythema.  Cardiovascular:     Rate and Rhythm: Normal rate and regular rhythm.     Heart sounds: Normal heart sounds.  Pulmonary:     Effort: Pulmonary effort is normal.     Breath sounds: Normal breath sounds. No wheezing, rhonchi or rales.  Musculoskeletal:     Cervical back: Normal range of motion and neck supple.  Skin:    General: Skin is warm and dry.  Neurological:     Mental Status: He is alert and oriented to person, place, and time.  Psychiatric:        Mood and Affect: Mood normal.        Behavior: Behavior normal.        Thought Content: Thought content normal.        Judgment: Judgment normal.         No results found for any visits on 03/05/24.  Assessment & Plan:   Problem List Items Addressed This Visit   None Visit Diagnoses       Acute maxillary sinusitis, recurrence not specified    -  Primary   Relevant Medications   doxycycline  (VIBRAMYCIN ) 100 MG capsule      Assessment & Plan Acute sinusitis Acute sinusitis with persistent symptoms for one week. No fever or significant otalgia. Doxycycline  chosen due to recent azithromycin  use. Informed about photosensitivity risk and sun protection. - Prescribe doxycycline  for one week. - Advise on photosensitivity; recommend sunscreen and protective clothing. - Continue guaifenesin  twice daily. Continue Flonase  and Xyzal . - Encourage increased fluid intake. - Advise rest and monitor symptoms    Meds ordered this encounter  Medications   doxycycline  (VIBRAMYCIN ) 100 MG capsule    Sig: Take 1 capsule (100  mg total) by mouth 2 (two) times daily for 7 days.    Dispense:  14 capsule    Refill:  0    Supervising Provider:   Randie Bustle A [[id number]]    Return if symptoms worsen or fail to improve.  Everlina Hock, NP

## 2024-03-21 ENCOUNTER — Encounter: Payer: Self-pay | Admitting: Medical

## 2024-03-21 ENCOUNTER — Ambulatory Visit: Admitting: Medical

## 2024-03-21 VITALS — BP 120/86 | HR 84 | Resp 18 | Ht 73.0 in | Wt 242.0 lb

## 2024-03-21 DIAGNOSIS — J4540 Moderate persistent asthma, uncomplicated: Secondary | ICD-10-CM

## 2024-03-21 MED ORDER — PREDNISONE 10 MG (21) PO TBPK
ORAL_TABLET | ORAL | 0 refills | Status: DC
Start: 2024-03-21 — End: 2024-07-17

## 2024-03-21 NOTE — Patient Instructions (Signed)
 Acute Asthma Exacerbation Moderate persistent asthma with acute exacerbation. Wheezing persists despite Symbicort  and albuterol . Sinus symptoms resolved, indicating asthma-related exacerbation. Prednisone  taper expected to improve symptoms by day three. - Prescribe prednisone  6-day taper. - Continue Symbicort  two puffs twice daily. - Use albuterol  as needed for wheezing. - Advise low sugar diet while on prednisone . - Instruct to send a MyChart update in seven days or sooner if symptoms do not improve. - Consider referral back to pulmonology if symptoms persist. - Consider imaging studies if symptoms do not improve.  General Health Maintenance High-sugar diet may affect health, especially during prednisone  treatment. Advised reducing sugar intake to prevent adverse effects on blood sugar levels. - Advise reducing soda consumption. - Encourage a low sugar diet.

## 2024-03-21 NOTE — Progress Notes (Signed)
 Subjective:    Patient ID: Bobby Hobbs, male    DOB: July 29, 1981, 43 y.o.   MRN: [id number]  HPI  Bobby Hobbs is a 43 year old male with moderate persistent asthma who presents with recent wheezing and an acute asthma flare.  He has been experiencing wheezing that began on Monday, March 18, 2024, and continued into Tuesday. He describes having a severe asthma attack on Monday night, which occurred after waking up and preparing for work. No specific activities or exposures on Sunday, aside from walking his dogs, are identified as potential triggers.  He is currently using Symbicort , two puffs twice a day, and albuterol  as needed. During the recent flare, he has been using albuterol  every four to six hours, sometimes requiring it twice in a row, and other times not at all. Albuterol  provides temporary relief, but the wheezing returns. He has been using albuterol  two to four times a day since Monday.  He has a dry cough without mucus production, and describes difficulty taking a full deep breath, but no chest pain. No calf pain, leg swelling, pain behind the knees, fevers, chills, or sweats.  He had a significant asthma flare last year. Symbicort  has been effective in the past, but did not prevent the current flare. He is not diabetic, but his average blood sugar level is 118. He consumes a diet high in sweets and drinks two sodas a day.      Review of Systems  Constitutional:  Negative for chills, fatigue and fever.  HENT:  Negative for congestion, ear pain, postnasal drip and sinus pain.   Respiratory:  Positive for wheezing. Negative for chest tightness and shortness of breath.   Cardiovascular:  Negative for chest pain and palpitations.  Gastrointestinal:  Negative for abdominal pain and diarrhea.  Musculoskeletal:  Negative for back pain.  Skin:  Negative for rash.  Neurological:  Negative for dizziness, syncope, weakness and light-headedness.  Hematological:   Negative for adenopathy. Does not bruise/bleed easily.  Psychiatric/Behavioral:  Negative for behavioral problems and decreased concentration.     Past Medical History:  Diagnosis Date   Allergy     Asthma    Diarrhea 09/06/2019   GERD (gastroesophageal reflux disease)    Irregular heart beat    Per pt very now and then pt said he has an extra heart beat   Vomiting 09/06/2019     Social History   Socioeconomic History   Marital status: Married    Spouse name: Bobby Hobbs   Number of children: 0   Years of education: Not on file   Highest education level: Not on file  Occupational History   Occupation: Film/video editor  Tobacco Use   Smoking status: Never    Passive exposure: Never   Smokeless tobacco: Never  Vaping Use   Vaping status: Never Used  Substance and Sexual Activity   Alcohol use: Yes    Alcohol/week: 0.0 standard drinks of alcohol    Comment: once a year   Drug use: No   Sexual activity: Yes  Other Topics Concern   Not on file  Social History Narrative   Not on file   Social Drivers of Health   Financial Resource Strain: Not on file  Food Insecurity: Not on file  Transportation Needs: Not on file  Physical Activity: Not on file  Stress: Not on file  Social Connections: Not on file  Intimate Partner Violence: Not on file    Past Surgical History:  Procedure  Laterality Date   LAPAROSCOPIC APPENDECTOMY N/A 12/28/2014   Procedure: APPENDECTOMY LAPAROSCOPIC;  Surgeon: Oralee Billow, MD;  Location: [facility];  Service: General;  Laterality: N/A;    Family History  Problem Relation Age of Onset   Hypertension Mother    Achalasia Mother    Diabetes Father    COPD Father    Diabetes Maternal Grandmother    Colon cancer Neg Hx    Esophageal cancer Neg Hx    Rectal cancer Neg Hx    Stomach cancer Neg Hx     Allergies  Allergen Reactions   Montelukast  Other (See Comments)    headache   Dairy Aid [Tilactase]    Hycodan [Hydrocodone  Bit-Homatrop Mbr]  Nausea And Vomiting    Tolerates hydrocodone  with acetaminophen     Current Outpatient Medications on File Prior to Visit  Medication Sig Dispense Refill   albuterol  (VENTOLIN  HFA) 108 (90 Base) MCG/ACT inhaler Use 2 inhalations every 4-6 hours as needed for cough and wheezing. 8 g 5   azelastine  (ASTELIN ) 0.1 % nasal spray Place 1 spray into both nostrils 2 (two) times daily. Use in each nostril as directed 30 mL 12   budesonide -formoterol  (SYMBICORT ) 80-4.5 MCG/ACT inhaler Inhale 2 puffs into the lungs 2 (two) times daily. 10.2 each 5   celecoxib  (CELEBREX ) 100 MG capsule Take 1 capsule (100 mg total) by mouth 2 (two) times daily. 60 capsule 0   EPINEPHrine  0.3 mg/0.3 mL IJ SOAJ injection Inject 0.3 mg into the muscle as needed for anaphylaxis. 2 each 1   fluticasone  (FLONASE ) 50 MCG/ACT nasal spray Place 2 sprays into both nostrils daily. 16 g 1   levocetirizine (XYZAL ) 5 MG tablet TAKE 1 TABLET BY MOUTH EVERY DAY IN THE EVENING 30 tablet 2   pantoprazole  (PROTONIX ) 40 MG tablet Take 1 tablet (40 mg total) by mouth daily. Call [phone] to schedule an office visit for more refills 30 tablet 2   No current facility-administered medications on file prior to visit.    BP 120/86   Pulse 84   Resp 18   Ht 6' 1 (1.854 m)   Wt 242 lb (109.8 kg)   SpO2 93%   BMI 31.93 kg/m   95% on recheck .     Objective:   Physical Exam  General- No acute distress. Pleasant patient.  Neck- Full range of motion, no jvd Lungs- even and unlabored presently. Did use symbicort  this mornig) Heart- regular rate and rhythm. Neurologic- CNII- XII grossly intact.  Lower ext- calfs symmetric, no pedal edema. Negative homans signs.      Assessment & Plan:   Patient Instructions  Acute Asthma Exacerbation Moderate persistent asthma with acute exacerbation. Wheezing persists despite Symbicort  and albuterol . Sinus symptoms resolved, indicating asthma-related exacerbation. Prednisone  taper expected to  improve symptoms by day three. - Prescribe prednisone  6-day taper. - Continue Symbicort  two puffs twice daily. - Use albuterol  as needed for wheezing. - Advise low sugar diet while on prednisone . - Instruct to send a MyChart update in seven days or sooner if symptoms do not improve. - Consider referral back to pulmonology if symptoms persist. - Consider imaging studies if symptoms do not improve.  General Health Maintenance High-sugar diet may affect health, especially during prednisone  treatment. Advised reducing sugar intake to prevent adverse effects on blood sugar levels. - Advise reducing soda consumption. - Encourage a low sugar diet.   Jeanene Mena, PA-C

## 2024-04-18 ENCOUNTER — Ambulatory Visit: Admitting: Medical

## 2024-04-18 ENCOUNTER — Encounter: Payer: Self-pay | Admitting: Medical

## 2024-04-18 VITALS — BP 118/80 | HR 74 | Temp 97.9°F | Ht 73.0 in | Wt 235.0 lb

## 2024-04-18 DIAGNOSIS — R509 Fever, unspecified: Secondary | ICD-10-CM | POA: Diagnosis not present

## 2024-04-18 DIAGNOSIS — M791 Myalgia, unspecified site: Secondary | ICD-10-CM | POA: Diagnosis not present

## 2024-04-18 DIAGNOSIS — J029 Acute pharyngitis, unspecified: Secondary | ICD-10-CM | POA: Diagnosis not present

## 2024-04-18 LAB — POCT RAPID STREP A (OFFICE): Rapid Strep A Screen: NEGATIVE

## 2024-04-18 MED ORDER — AZITHROMYCIN 250 MG PO TABS: ORAL_TABLET | ORAL | 0 refills | Status: AC

## 2024-04-18 NOTE — Addendum Note (Signed)
 Addended by: DORINA DALLAS HERO on: 04/18/2024 09:16 AM   Modules accepted: Orders

## 2024-04-18 NOTE — Patient Instructions (Signed)
 Pharyngitis(with fever and muscle aches) Rapid strep and COVID-19 tests negative, but clinical presentation suggests bacterial pharyngitis. Prescribed azithromycin . Advised retesting for COVID-19 if symptoms worsen. - Prescribed azithromycin . - Advised hydration and acetaminophen . - Instructed rest and symptom monitoring. - Advised COVID-19 retest if symptoms worsen. - Provided work note.  Follow up as needed

## 2024-04-18 NOTE — Addendum Note (Signed)
 Addended by: Anas Reister on: 04/18/2024 09:46 AM   Modules accepted: Orders

## 2024-04-18 NOTE — Progress Notes (Signed)
 Subjective:    Patient ID: Bobby Hobbs, male    DOB: 10/05/1981, 43 y.o.   MRN: [id number]  HPI  He presents with fever, body aches, and sore throat.  He experienced the onset of fever last night, which broke around 3:00 AM this morning. He continues to have body aches and chills. He also has a dry, sporadic cough.  A COVID-19 test performed at home last night was negative. He mentions having congestion, which he considers normal for this time of year, and denies any sinus pain typically associated with his frequent sinus infections.  He describes his sore throat as more scratchy and uncomfortable than painful. No ear pain is present, and his cough is sporadic rather than constant.  He had a fever of 100.62F until about 3:00 AM. He is scheduled to work tonight and follows a 'weird [employer] schedule.' He called out of work yesterday due to the fever and symptoms.  No family members are currently sick, but he acknowledges that 'random crap's always going around.'      Review of Systems  Constitutional:  Positive for chills and fever.  HENT:  Positive for congestion and sore throat. Negative for mouth sores and sinus pressure.   Respiratory:  Positive for cough. Negative for chest tightness and wheezing.   Cardiovascular:  Negative for chest pain and palpitations.  Gastrointestinal:  Negative for abdominal pain.  Genitourinary:  Negative for dysuria and frequency.  Musculoskeletal:  Negative for back pain, myalgias and neck stiffness.  Skin:  Negative for rash.  Neurological:  Negative for dizziness, speech difficulty, weakness and light-headedness.  Hematological:  Negative for adenopathy.  Psychiatric/Behavioral:  Negative for behavioral problems and dysphoric mood.     Past Medical History:  Diagnosis Date   Allergy     Asthma    Diarrhea 09/06/2019   GERD (gastroesophageal reflux disease)    Irregular heart beat    Per pt very now and then pt said he has an  extra heart beat   Vomiting 09/06/2019     Social History   Socioeconomic History   Marital status: Married    Spouse name: Harlene   Number of children: 0   Years of education: Not on file   Highest education level: Not on file  Occupational History   Occupation: Film/video editor  Tobacco Use   Smoking status: Never    Passive exposure: Never   Smokeless tobacco: Never  Vaping Use   Vaping status: Never Used  Substance and Sexual Activity   Alcohol use: Yes    Alcohol/week: 0.0 standard drinks of alcohol    Comment: once a year   Drug use: No   Sexual activity: Yes  Other Topics Concern   Not on file  Social History Narrative   Not on file   Social Drivers of Health   Financial Resource Strain: Not on file  Food Insecurity: Not on file  Transportation Needs: Not on file  Physical Activity: Not on file  Stress: Not on file  Social Connections: Not on file  Intimate Partner Violence: Not on file    Past Surgical History:  Procedure Laterality Date   LAPAROSCOPIC APPENDECTOMY N/A 12/28/2014   Procedure: APPENDECTOMY LAPAROSCOPIC;  Surgeon: Krystal Spinner, MD;  Location: [facility];  Service: General;  Laterality: N/A;    Family History  Problem Relation Age of Onset   Hypertension Mother    Achalasia Mother    Diabetes Father    COPD Father  Diabetes Maternal Grandmother    Colon cancer Neg Hx    Esophageal cancer Neg Hx    Rectal cancer Neg Hx    Stomach cancer Neg Hx     Allergies  Allergen Reactions   Montelukast  Other (See Comments)    headache   Dairy Aid [Tilactase]    Hycodan [Hydrocodone  Bit-Homatrop Mbr] Nausea And Vomiting    Tolerates hydrocodone  with acetaminophen     Current Outpatient Medications on File Prior to Visit  Medication Sig Dispense Refill   albuterol  (VENTOLIN  HFA) 108 (90 Base) MCG/ACT inhaler Use 2 inhalations every 4-6 hours as needed for cough and wheezing. 8 g 5   azelastine  (ASTELIN ) 0.1 % nasal spray Place 1 spray into  both nostrils 2 (two) times daily. Use in each nostril as directed 30 mL 12   budesonide -formoterol  (SYMBICORT ) 80-4.5 MCG/ACT inhaler Inhale 2 puffs into the lungs 2 (two) times daily. 10.2 each 5   celecoxib  (CELEBREX ) 100 MG capsule Take 1 capsule (100 mg total) by mouth 2 (two) times daily. (Patient not taking: Reported on 04/18/2024) 60 capsule 0   EPINEPHrine  0.3 mg/0.3 mL IJ SOAJ injection Inject 0.3 mg into the muscle as needed for anaphylaxis. 2 each 1   fluticasone  (FLONASE ) 50 MCG/ACT nasal spray Place 2 sprays into both nostrils daily. 16 g 1   levocetirizine (XYZAL ) 5 MG tablet TAKE 1 TABLET BY MOUTH EVERY DAY IN THE EVENING 30 tablet 2   pantoprazole  (PROTONIX ) 40 MG tablet Take 1 tablet (40 mg total) by mouth daily. Call [phone] to schedule an office visit for more refills 30 tablet 2   predniSONE  (STERAPRED UNI-PAK 21 TAB) 10 MG (21) TBPK tablet Standard taper over 6 days. (Patient not taking: Reported on 04/18/2024) 21 tablet 0   No current facility-administered medications on file prior to visit.    BP 118/80   Pulse 74   Temp 97.9 F (36.6 C)   Ht 6' 1 (1.854 m)   Wt 235 lb (106.6 kg)   SpO2 98%   BMI 31.00 kg/m        Objective:   Physical Exam  General Mental Status- Alert. General Appearance- Not in acute distress.   Skin General: Color- Normal Color. Moisture- Normal Moisture.  Neck Carotid Arteries- Normal color. Moisture- Normal Moisture. No carotid bruits. No JVD.  Chest and Lung Exam Auscultation: Breath Sounds:-Normal.  Cardiovascular Auscultation:Rythm- Regular. Murmurs & Other Heart Sounds:Auscultation of the heart reveals- No Murmurs.  Abdomen Inspection:-Inspeection Normal. Palpation/Percussion:Note:No mass. Palpation and Percussion of the abdomen reveal- Non Tender, Non Distended + BS, no rebound or guarding.   Neurologic Cranial Nerve exam:- CN III-XII intact(No nystagmus), symmetric smile. Strength:- 5/5 equal and symmetric  strength both upper and lower extremities.   Heent- moderate tonsil hypertrophy with redness. Normal tms and no sinus pressure.    Assessment & Plan:   Patient Instructions  Pharyngitis(with fever and muscle aches) Rapid strep and COVID-19 tests negative, but clinical presentation suggests bacterial pharyngitis. Prescribed azithromycin . Advised retesting for COVID-19 if symptoms worsen. - Prescribed azithromycin . - Advised hydration and acetaminophen . - Instructed rest and symptom monitoring. - Advised COVID-19 retest if symptoms worsen. - Provided work note.  Follow up as needed

## 2024-05-11 ENCOUNTER — Other Ambulatory Visit: Payer: Self-pay | Admitting: Medical

## 2024-05-22 ENCOUNTER — Ambulatory Visit: Payer: Self-pay

## 2024-05-22 NOTE — Telephone Encounter (Signed)
 FYI Only or Action Required?: FYI only for provider.  Patient was last seen in primary care on 04/18/2024 by Dorina Loving, PA-C.  Called Nurse Triage reporting Knee Pain.  Symptoms began a week ago.  Interventions attempted: OTC medications: Aleve  and Rest, hydration, or home remedies.  Symptoms are: gradually worsening.  Triage Disposition: See PCP When Office is Open (Within 3 Days)  Patient/caregiver understands and will follow disposition?: Yes  Copied from CRM #[id number]. Topic: Clinical - Red Word Triage >> May 22, 2024  9:20 AM Suzen RAMAN wrote: Red Word that prompted transfer to Nurse Triage: left knee pain Reason for Disposition  [1] MODERATE pain (e.g., interferes with normal activities, limping) AND [2] present > 3 days  Answer Assessment - Initial Assessment Questions 1. LOCATION and RADIATION: Where is the pain located?      Left knee pain  2. QUALITY: What does the pain feel like?  (e.g., sharp, dull, aching, burning)     Dull throbbing pain  3. SEVERITY: How bad is the pain? What does it keep you from doing?   (Scale 1-10; or mild, moderate, severe)     4/10 pain at rest; 7/10 pain when start moving around  4. ONSET: When did the pain start? Does it come and go, or is it there all the time?     Started a few years ago, worsening over the past week  5. RECURRENT: Have you had this pain before? If Yes, ask: When, and what happened then?     Yes, has hx of degenerative cartilage in knee. had HH PT before and prescribed celebrex  and diclofenac .  6. SETTING: Has there been any recent work, exercise or other activity that involved that part of the body?      Has been doing more walking and stairs at work  7. AGGRAVATING FACTORS: What makes the knee pain worse? (e.g., walking, climbing stairs, running)     Walking and climbing  8. ASSOCIATED SYMPTOMS: Is there any swelling or redness of the knee?     No  9. OTHER SYMPTOMS: Do you have any  other symptoms? (e.g., calf pain, chest pain, difficulty breathing, fever)     *No Answer*  Protocols used: Knee Pain-A-AH

## 2024-05-22 NOTE — Telephone Encounter (Signed)
 Appt scheduled

## 2024-05-22 NOTE — Progress Notes (Signed)
   Acute Office Visit  Subjective:     Patient ID: Bobby Hobbs, male    DOB: 1980-12-05, 43 y.o.   MRN: [id number]  Chief Complaint  Patient presents with   Knee Pain    Left onset: 3-4 days     HPI Patient is in today for left knee pain. Complains of knee pain onset 1 week ago. Reports left knee pops and buckles at work. Reports he was Dx with cartelidge degradation in the past.  Has tried Aleve  and rest.  Arrives moderate pain in left knee 4/10 at rest; 7/10 with activity.  Reports chronic pain in this knee for years, however it is worsened over the past week. Pt states he has done PT for this in the past. History of degenerative cartilage in the knee and has been prescribed Celebrex  and diclofenac  in the past.  Denies locking, swelling.  Able to bear weight.   Patient denies fever, chills, SOB, CP, palpitations, dyspnea, edema, HA, vision changes, N/V/D, abdominal pain, urinary symptoms, rash, weight changes, and recent illness or hospitalizations.   ROS  See HPI    Objective:    BP 138/80   Pulse 66   Temp 98.2 F (36.8 C)   Resp 18   Ht 6' 1 (1.854 m)   Wt 233 lb 9.6 oz (106 kg)   SpO2 98%   BMI 30.82 kg/m    Physical Exam  General: No acute distress. Awake and conversant.  Respiratory: CTAB. Respirations are non-labored. No wheezing.  Skin: Warm. No rashes or ulcers.  Psych: Alert and oriented. Cooperative, Appropriate mood and affect, Normal judgment.  CV: RRR. No murmur. No lower extremity edema.  MSK: No clubbing or cyanosis.  +left knee FULL ROM, popping with movement Neuro:  CN II-XII grossly normal.    No results found for any visits on 05/23/24.      Assessment & Plan:   Problem List Items Addressed This Visit     Chronic pain of left knee - Primary   Xray pending Rx. Mobic  7.5 mg prn Refer to Ortho for further evaluation      Relevant Medications   meloxicam  (MOBIC ) 7.5 MG tablet   Other Relevant Orders   DG Knee 1-2 Views  Left   Ambulatory referral to Orthopedic Surgery   Rest Ice Compress Elevate left knee  Portions of this note were dictated using DRAGON voice recognition software. Please disregard any errors in transcription.    Meds ordered this encounter  Medications   meloxicam  (MOBIC ) 7.5 MG tablet    Sig: Take 1 tablet (7.5 mg total) by mouth daily as needed for pain.    Dispense:  15 tablet    Refill:  0    Supervising Provider:   DOMENICA BLACKBIRD A [[id number]]    No follow-ups on file.  Kostantinos Tallman L Jaline Pincock, NP

## 2024-05-23 ENCOUNTER — Ambulatory Visit: Admitting: Student

## 2024-05-23 ENCOUNTER — Encounter: Payer: Self-pay | Admitting: Student

## 2024-05-23 ENCOUNTER — Ambulatory Visit (HOSPITAL_BASED_OUTPATIENT_CLINIC_OR_DEPARTMENT_OTHER)
Admission: RE | Admit: 2024-05-23 | Discharge: 2024-05-23 | Disposition: A | Discharge status: Home / Self Care | Source: Ambulatory Visit | Attending: Student | Admitting: Student

## 2024-05-23 VITALS — BP 138/80 | HR 66 | Temp 98.2°F | Resp 18 | Ht 73.0 in | Wt 233.6 lb

## 2024-05-23 DIAGNOSIS — G8929 Other chronic pain: Secondary | ICD-10-CM | POA: Insufficient documentation

## 2024-05-23 DIAGNOSIS — M25562 Pain in left knee: Secondary | ICD-10-CM | POA: Diagnosis present

## 2024-05-23 MED ORDER — MELOXICAM 7.5 MG PO TABS: 7.5000 mg | ORAL_TABLET | Freq: Every day | ORAL | 0 refills | Status: DC | PRN

## 2024-05-23 NOTE — Assessment & Plan Note (Signed)
 Xray pending Rx. Mobic  7.5 mg prn Refer to Ortho for further evaluation

## 2024-05-31 ENCOUNTER — Encounter: Payer: Self-pay | Admitting: Surgical

## 2024-05-31 ENCOUNTER — Ambulatory Visit: Admitting: Surgical

## 2024-05-31 DIAGNOSIS — M25562 Pain in left knee: Secondary | ICD-10-CM | POA: Diagnosis not present

## 2024-05-31 DIAGNOSIS — G8929 Other chronic pain: Secondary | ICD-10-CM | POA: Diagnosis not present

## 2024-05-31 NOTE — Addendum Note (Signed)
 Addended by: TRINDA DEANE HERO on: 05/31/2024 09:38 AM   Modules accepted: Orders

## 2024-05-31 NOTE — Progress Notes (Signed)
 Office Visit Note   Patient: Bobby Hobbs           Date of Birth: September 27, 1981           MRN: [id number] Visit Date: 05/31/2024 Requested by: Wheeler Harlene CROME, NP [address], [address] [place],  [place] [address] PCP: Dorina Dallas RIGGERS   Subjective: Chief Complaint  Patient presents with   Left Knee - Pain    HPI: Bobby Hobbs is a 43 y.o. male who presents to the office reporting left knee pain.  Patient has had pain for several years.  He describes fairly diffuse left knee pain both in the medial, lateral, anterior aspect of the knee.  No radiation of pain.  No groin pain.  No history of prior surgery.  Has had physical therapy in 2021 that was somewhat helpful for him.  He has kept up with these exercises and strength training program.  He describes clicking and popping and mechanical symptoms but no locking episodes.  Bothers him with stairs and after long workdays.  It will never wake him up from sleep at night but it will keep him from going to sleep.  He takes occasional Advil  with partial relief of his symptoms.  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  He also has several hobbies including shooting guns, exercising, walking his dogs, videogames..                ROS: All systems reviewed are negative as they relate to the chief complaint within the history of present illness.  Patient denies fevers or chills.  Assessment & Plan: Visit Diagnoses:  1. Chronic pain of left knee     Plan: Patient is a 43 year old male who presents for evaluation of left knee pain.  Has history of left knee pain that has been ongoing for several years but has been specifically worse in the last few weeks without recent injury.  He works at a jail and works night shift and it really bothers him after he gets off work.  He has formerly had physical therapy and has kept up with physical therapist design home exercise program amongst a strength training  program as well.  Despite this, his knee pain persists and is worsening.  After discussion of options, he would like to try MRI of the left knee for further evaluation of the source of his pain.  He has never had MRI scan before.  Plan to follow-up after MRI to review results.  The most symptomatic maneuver for him on exam today was compression of the patella and the trochlea reproducing his symptoms.  May have early patellofemoral arthritis.  Radiographs reviewed today demonstrating no significant degenerative changes or acute osseous abnormality regarding the left knee..  Follow-Up Instructions: No follow-ups on file.   Orders:  No orders of the defined types were placed in this encounter.  No orders of the defined types were placed in this encounter.     Procedures: No procedures performed   Clinical Data: No additional findings.  Objective: Vital Signs: There were no vitals taken for this visit.  Physical Exam:  Constitutional: Patient appears well-developed HEENT:  Head: Normocephalic Eyes:EOM are normal Neck: Normal range of motion Cardiovascular: Normal rate Pulmonary/chest: Effort normal Neurologic: Patient is alert Skin: Skin is warm Psychiatric: Patient has normal mood and affect  Ortho Exam: Ortho exam demonstrates left knee without effusion.  no calf tenderness.  Negative Homans' sign.  Intact ankle dorsiflexion and plantarflexion.  Able to perform straight leg raise without extensor lag.  Good quad strength rated 5/5.  No tenderness over the medial or lateral joint lines or pes anserine bursa.  No tenderness over the patellar or quadriceps tendons.  He does have reproducible pain with compression of the patella and the trochlea.  He has range of motion from 0 degrees extension to 120 degrees of knee flexion.  No pain with hip range of motion.  Stable to anterior and posterior drawer sign.  Stable to varus and valgus stress at 0 and 30 degrees.  No posterolateral  rotational instability.  Specialty Comments:  No specialty comments available.  Imaging: No results found.   PMFS History: Patient Active Problem List   Diagnosis Date Noted   Chronic pain of left knee 05/23/2024   Acute non-recurrent maxillary sinusitis 09/23/2022   Acute cough 09/23/2022   Asthma 09/23/2022   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 04/17/2020   Dyspnea 01/06/2020   Vomiting 09/06/2019   Diarrhea 09/06/2019   Pain in joint of left shoulder 03/27/2019   Chronic maxillary sinusitis 01/24/2019   Acute pain of left shoulder 01/30/2018   Neck pain on left side 01/30/2018   Muscle spasm 01/30/2018   Eyelid dermatitis, allergic/contact 09/16/2015   Rhinitis, allergic 09/16/2015   Acute gangrenous appendicitis with perforation s/p lap appy 12/28/2014 12/28/2014   Dental caries 06/03/2013   Past Medical History:  Diagnosis Date   Allergy     Asthma    Diarrhea 09/06/2019   GERD (gastroesophageal reflux disease)    Irregular heart beat    Per pt very now and then pt said he has an extra heart beat   Vomiting 09/06/2019    Family History  Problem Relation Age of Onset   Hypertension Mother    Achalasia Mother    Diabetes Father    COPD Father    Diabetes Maternal Grandmother    Colon cancer Neg Hx    Esophageal cancer Neg Hx    Rectal cancer Neg Hx    Stomach cancer Neg Hx     Past Surgical History:  Procedure Laterality Date   LAPAROSCOPIC APPENDECTOMY N/A 12/28/2014   Procedure: APPENDECTOMY LAPAROSCOPIC;  Surgeon: Krystal Spinner, MD;  Location: [facility];  Service: General;  Laterality: N/A;   Social History   Occupational History   Occupation: Film/video editor  Tobacco Use   Smoking status: Never    Passive exposure: Never   Smokeless tobacco: Never  Vaping Use   Vaping status: Never Used  Substance and Sexual Activity   Alcohol use: Yes    Alcohol/week: 0.0 standard drinks of alcohol    Comment: once a year   Drug use: No    Sexual activity: Yes

## 2024-06-04 ENCOUNTER — Ambulatory Visit: Admitting: Medical

## 2024-06-04 VITALS — BP 120/80 | HR 76 | Temp 98.2°F | Resp 18 | Ht 73.0 in | Wt 223.0 lb

## 2024-06-04 DIAGNOSIS — R062 Wheezing: Secondary | ICD-10-CM | POA: Diagnosis not present

## 2024-06-04 DIAGNOSIS — J0100 Acute maxillary sinusitis, unspecified: Secondary | ICD-10-CM | POA: Diagnosis not present

## 2024-06-04 MED ORDER — AMOXICILLIN-POT CLAVULANATE 875-125 MG PO TABS: 1.0000 | ORAL_TABLET | Freq: Two times a day (BID) | ORAL | 0 refills | Status: DC

## 2024-06-04 NOTE — Patient Instructions (Signed)
 Acute maxillary sinusitis on chronic intermittent maxillary sinusitis Symptoms consistent with acute maxillary sinusitis. Negative COVID-19 and flu tests at work. Frequent sinus infections, ENT recommended surgery, patient reluctant. Augmentin  chosen for current treatment. - Prescribed Augmentin  875 mg BID for 10 days. - Continue Flonase  nasal spray. - Advise monitoring cough, use benzonatate  if needed. - Instructed to contact if cough worsens before long weekend.  Asthma Intermittent chest tightness and wheezing resolved with albuterol . Adequate oxygenation at 97%. Heat and stress may exacerbate symptoms. - Continue Symbicort  two inhalations BID. - Use albuterol  PRN for symptoms. - Instructed to report if albuterol  needed every six hours.  Follow up 7-10 days if symptoms persist or sooner if worsens.

## 2024-06-04 NOTE — Progress Notes (Signed)
 Subjective:    Patient ID: Bobby Hobbs, male    DOB: 12-25-1980, 43 y.o.   MRN: [id number]  HPI  Bobby Hobbs is a 43 year old male with recurrent sinus infections and asthma who presents with symptoms of a sinus infection.  He has been experiencing symptoms consistent with a sinus infection for the past four days, including pressure in the maxillary sinuses, headache, sore throat upon waking, and greenish nasal discharge. He has also experienced chills, sweats, and possibly a fever, which resolved by morning. He tested negative for COVID-19 and flu as of Sunday.  He has a history of frequent sinus infections, occurring about three to four times a year, previously treated with antibiotics such as doxycycline  and Augmentin  on different occasions.SABRA  He reports intermittent chest tightness attributed to asthma, describing it as a tightness rather than pain. The tightness occurs yesterda and was relieved by albuterol  inhaler after a couple of hours. He also uses Symbicort , two inhalations twice a day, and Flonase  nasal spray. Occasional wheezing prompted this appointment.  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 He mentioned that the heat does not help with his symptoms.        Review of Systems  Constitutional:  Positive for fever. Negative for chills and fatigue.       Maybe yesterday and day before.  HENT:  Positive for congestion, sinus pressure and sinus pain.   Respiratory:  Negative for cough, chest tightness, shortness of breath and wheezing.   Cardiovascular:  Negative for chest pain and palpitations.  Gastrointestinal:  Negative for anal bleeding and diarrhea.  Genitourinary:  Negative for dysuria and frequency.  Musculoskeletal:  Negative for back pain.  Skin:  Negative for rash.  Neurological:  Negative for dizziness and  light-headedness.  Hematological:  Negative for adenopathy. Does not bruise/bleed easily.  Psychiatric/Behavioral:  Negative for behavioral problems, confusion and suicidal ideas. The patient is not nervous/anxious.     Past Medical History:  Diagnosis Date   Allergy     Asthma    Diarrhea 09/06/2019   GERD (gastroesophageal reflux disease)    Irregular heart beat    Per pt very now and then pt said he has an extra heart beat   Vomiting 09/06/2019     Social History   Socioeconomic History   Marital status: Married    Spouse name: Bobby Hobbs   Number of children: 0   Years of education: Not on file   Highest education level: Not on file  Occupational History   Occupation: Film/video editor  Tobacco Use   Smoking status: Never    Passive exposure: Never   Smokeless tobacco: Never  Vaping Use   Vaping status: Never Used  Substance and Sexual Activity   Alcohol use: Yes    Alcohol/week: 0.0 standard drinks of alcohol    Comment: once a year   Drug use: No   Sexual activity: Yes  Other Topics Concern   Not on file  Social History Narrative   Not on file   Social Drivers of Health   Financial Resource Strain: Not on file  Food Insecurity: Not on file  Transportation Needs: Not on file  Physical Activity: Not on file  Stress: Not on file  Social Connections: Not on file  Intimate Partner Violence: Not on file    Past Surgical History:  Procedure Laterality Date   LAPAROSCOPIC APPENDECTOMY N/A 12/28/2014   Procedure: APPENDECTOMY LAPAROSCOPIC;  Surgeon: Krystal Spinner, MD;  Location: [facility];  Service: General;  Laterality: N/A;    Family History  Problem Relation Age of Onset   Hypertension Mother    Achalasia Mother    Diabetes Father    COPD Father    Diabetes Maternal Grandmother    Colon cancer Neg Hx    Esophageal cancer Neg Hx    Rectal cancer Neg Hx    Stomach cancer Neg Hx     Allergies  Allergen Reactions   Montelukast  Other (See Comments)     headache   Dairy Aid [Tilactase]    Hycodan [Hydrocodone  Bit-Homatrop Mbr] Nausea And Vomiting    Tolerates hydrocodone  with acetaminophen     Current Outpatient Medications on File Prior to Visit  Medication Sig Dispense Refill   albuterol  (VENTOLIN  HFA) 108 (90 Base) MCG/ACT inhaler Use 2 inhalations every 4-6 hours as needed for cough and wheezing. 8 g 5   azelastine  (ASTELIN ) 0.1 % nasal spray Place 1 spray into both nostrils 2 (two) times daily. Use in each nostril as directed 30 mL 12   budesonide -formoterol  (SYMBICORT ) 80-4.5 MCG/ACT inhaler Inhale 2 puffs into the lungs 2 (two) times daily. 10.2 each 5   celecoxib  (CELEBREX ) 100 MG capsule Take 1 capsule (100 mg total) by mouth 2 (two) times daily. (Patient not taking: Reported on 04/18/2024) 60 capsule 0   EPINEPHrine  0.3 mg/0.3 mL IJ SOAJ injection Inject 0.3 mg into the muscle as needed for anaphylaxis. 2 each 1   fluticasone  (FLONASE ) 50 MCG/ACT nasal spray Place 2 sprays into both nostrils daily. 16 g 1   levocetirizine (XYZAL ) 5 MG tablet TAKE 1 TABLET BY MOUTH EVERY DAY IN THE EVENING 30 tablet 2   meloxicam  (MOBIC ) 7.5 MG tablet Take 1 tablet (7.5 mg total) by mouth daily as needed for pain. 15 tablet 0   pantoprazole  (PROTONIX ) 40 MG tablet Take 1 tablet (40 mg total) by mouth daily. Call [phone] to schedule an office visit for more refills 30 tablet 2   predniSONE  (STERAPRED UNI-PAK 21 TAB) 10 MG (21) TBPK tablet Standard taper over 6 days. (Patient not taking: Reported on 04/18/2024) 21 tablet 0   No current facility-administered medications on file prior to visit.    BP 120/80   Pulse 76   Temp 98.2 F (36.8 C)   Resp 18   Ht 6' 1 (1.854 m)   Wt 223 lb (101.2 kg)   SpO2 97%   BMI 29.42 kg/m        Objective:   Physical Exam  General Mental Status- Alert. General Appearance- Not in acute distress.   Skin General: Color- Normal Color. Moisture- Normal Moisture.  Neck  No JVD.  Chest and Lung  Exam Auscultation: Breath Sounds:CTA  Cardiovascular Auscultation:Rythm- RRR Murmurs & Other Heart Sounds:Auscultation of the heart reveals- No Murmurs.  Abdomen Inspection:-Inspeection Normal. Palpation/Percussion:Note:No mass. Palpation and Percussion of the abdomen reveal- Non Tender, Non Distended + BS, no rebound or guarding.   Neurologic Cranial Nerve exam:- CN III-XII intact(No nystagmus), symmetric smile. Strength:- 5/5 equal and symmetric strength both upper and lower extremities.   Heent- maxillary sinus pressure to palpation. No frontal sinus pressure. On inspection septum does seem mild deviated to rt. Canals clear- normal tms and canals clear.     Assessment & Plan:   Patient Instructions  Acute maxillary sinusitis on chronic intermittent maxillary sinusitis Symptoms consistent  with acute maxillary sinusitis. Negative COVID-19 and flu tests at work. Frequent sinus infections, ENT recommended surgery, patient reluctant. Augmentin  chosen for current treatment. - Prescribed Augmentin  875 mg BID for 10 days. - Continue Flonase  nasal spray. - Advise monitoring cough, use benzonatate  if needed. - Instructed to contact if cough worsens before long weekend.  Asthma Intermittent chest tightness and wheezing resolved with albuterol . Adequate oxygenation at 97%. Heat and stress may exacerbate symptoms. - Continue Symbicort  two inhalations BID. - Use albuterol  PRN for symptoms. - Instructed to report if albuterol  needed every six hours.  Follow up 7-10 days if symptoms persist or sooner if worsens.    Yarden Manuelito, PA-C

## 2024-06-07 ENCOUNTER — Ambulatory Visit: Payer: Self-pay | Admitting: Student

## 2024-06-15 ENCOUNTER — Ambulatory Visit
Admission: RE | Admit: 2024-06-15 | Discharge: 2024-06-15 | Disposition: A | Discharge status: Home / Self Care | Source: Ambulatory Visit | Attending: Surgical | Admitting: Surgical

## 2024-06-15 DIAGNOSIS — G8929 Other chronic pain: Secondary | ICD-10-CM

## 2024-06-24 ENCOUNTER — Ambulatory Visit: Admitting: Surgical

## 2024-06-24 DIAGNOSIS — G8929 Other chronic pain: Secondary | ICD-10-CM

## 2024-06-24 DIAGNOSIS — M65962 Unspecified synovitis and tenosynovitis, left lower leg: Secondary | ICD-10-CM | POA: Diagnosis not present

## 2024-06-24 DIAGNOSIS — M25562 Pain in left knee: Secondary | ICD-10-CM | POA: Diagnosis not present

## 2024-06-24 MED ORDER — MELOXICAM 7.5 MG PO TABS: 7.5000 mg | ORAL_TABLET | Freq: Every day | ORAL | 0 refills | Status: DC | PRN

## 2024-06-30 ENCOUNTER — Encounter: Payer: Self-pay | Admitting: Surgical

## 2024-06-30 ENCOUNTER — Other Ambulatory Visit: Payer: Self-pay | Admitting: Pulmonary Disease

## 2024-06-30 DIAGNOSIS — J4540 Moderate persistent asthma, uncomplicated: Secondary | ICD-10-CM

## 2024-06-30 MED ORDER — TRIAMCINOLONE ACETONIDE 40 MG/ML IJ SUSP
40.0000 mg | INTRAMUSCULAR | Status: AC | PRN
  Administered 2024-06-24: 40 mg via INTRA_ARTICULAR

## 2024-06-30 MED ORDER — LIDOCAINE HCL 1 % IJ SOLN
5.0000 mL | INTRAMUSCULAR | Status: AC | PRN
  Administered 2024-06-24: 5 mL

## 2024-06-30 MED ORDER — BUPIVACAINE HCL 0.25 % IJ SOLN
4.0000 mL | INTRAMUSCULAR | Status: AC | PRN
  Administered 2024-06-24: 4 mL via INTRA_ARTICULAR

## 2024-06-30 NOTE — Progress Notes (Signed)
 Office Visit Note   Patient: Bobby Hobbs           Date of Birth: Nov 25, 1980           MRN: [id number] Visit Date: 06/24/2024 Requested by: Dorina Loving, PA-C [address] [place],  [place] [address] PCP: Dorina Loving RIGGERS  Subjective: Chief Complaint  Patient presents with   Left Knee - Pain, Follow-up    MRI review    HPI: Bobby Hobbs is a 43 y.o. male who presents to the office for MRI review. Patient denies any changes in symptoms.  Continues to complain mainly of continued pain around the knee that is on any worse.  Sore after long work shifts.  Taking over-the-counter medications.  Recently finished meloxicam  and feels like it was helping a moderate amount.  MRI results revealed: MR Knee Left w/o contrast Result Date: 06/16/2024 MR KNEE WITHOUT IV CONTRAST LEFT COMPARISON: None. CLINICAL HISTORY: Knee pain PULSE SEQUENCES: Ax PD FS, Sag T2 ACL, Sag PD FS, Cor PD FS & COR T1 FINDINGS: Bones: There is no fracture or contusion pattern. No full-thickness cartilage defect is identified. Extensor mechanism is grossly intact. There is mild tendinosis at the patellar origin of the patellar tendon. No significant joint effusion. Ligaments: The ACL, PCL, MCL and fibular collateral ligament are intact. Menisci: Medial and lateral menisci are intact. There is mild signal in a globular appearance of the posterior horn and posterior body of the medial meniscus without discrete meniscal tear. IMPRESSION: No fracture or accelerated arthrosis. No significant joint effusion. Slight enlargement and mild increased signal in the body and posterior horn the medial meniscus. No discrete meniscal tear is identified. No ligament injury. Mild tendinosis is suspected the origin of the patellar tendon. Electronically signed by: Norleen Satchel MD 06/16/2024 01:00 PM EDT RP Workstation: [id number]                 ROS: All systems reviewed are negative as they relate to the  chief complaint within the history of present illness.  Patient denies fevers or chills.  Assessment & Plan: Visit Diagnoses:  1. Synovitis of left knee   2. Chronic pain of left knee     Plan: Bobby Hobbs is a 43 y.o. male who presents to the office for review of left knee MRI.  MRI demonstrates mild signal in the medial meniscus but otherwise no structural abnormality is noted.  We discussed options available to patient.  Will refill his meloxicam  and try one-time injection with cortisone into the left knee.  Tolerated procedure well without complication.  Will set him up for physical therapy upstairs to evaluate and treat left knee pain and hopefully keeping up with these exercises will optimize his knee long-term and improve his symptoms.  Follow-up with the office as needed.  Follow-Up Instructions: No follow-ups on file.   Orders:  Orders Placed This Encounter  Procedures   Ambulatory referral to Physical Therapy   Meds ordered this encounter  Medications   meloxicam  (MOBIC ) 7.5 MG tablet    Sig: Take 1 tablet (7.5 mg total) by mouth daily as needed for pain.    Dispense:  15 tablet    Refill:  0      Procedures: Large Joint Inj: L knee on 06/24/2024 6:20 PM Indications: diagnostic evaluation, joint swelling and pain Details: 18 G 1.5 in needle, superolateral approach  Arthrogram: No  Medications: 5 mL lidocaine  1 %; 4 mL bupivacaine   0.25 %; 40 mg triamcinolone  acetonide 40 MG/ML Outcome: tolerated well, no immediate complications Procedure, treatment alternatives, risks and benefits explained, specific risks discussed. Consent was given by the patient. Immediately prior to procedure a time out was called to verify the correct patient, procedure, equipment, support staff and site/side marked as required. Patient was prepped and draped in the usual sterile fashion.       Clinical Data: No additional findings.  Objective: Vital Signs: There were no vitals  taken for this visit.  Physical Exam:  Constitutional: Patient appears well-developed HEENT:  Head: Normocephalic Eyes:EOM are normal Neck: Normal range of motion Cardiovascular: Normal rate Pulmonary/chest: Effort normal Neurologic: Patient is alert Skin: Skin is warm Psychiatric: Patient has normal mood and affect  Ortho Exam: Ortho exam demonstrates left knee without effusion.  Tenderness over the posterior medial joint line.  No ligamentous laxity.  Able to perform straight leg raise without extensor lag.  No pain with hip range of motion.  Palpable DP pulse.  No skin changes or cellulitis noted.  Specialty Comments:  No specialty comments available.  Imaging: No results found.   PMFS History: Patient Active Problem List   Diagnosis Date Noted   Chronic pain of left knee 05/23/2024   Acute non-recurrent maxillary sinusitis 09/23/2022   Acute cough 09/23/2022   Asthma 09/23/2022   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 04/17/2020   Dyspnea 01/06/2020   Vomiting 09/06/2019   Diarrhea 09/06/2019   Pain in joint of left shoulder 03/27/2019   Chronic maxillary sinusitis 01/24/2019   Acute pain of left shoulder 01/30/2018   Neck pain on left side 01/30/2018   Muscle spasm 01/30/2018   Eyelid dermatitis, allergic/contact 09/16/2015   Rhinitis, allergic 09/16/2015   Acute gangrenous appendicitis with perforation s/p lap appy 12/28/2014 12/28/2014   Dental caries 06/03/2013   Past Medical History:  Diagnosis Date   Allergy     Asthma    Diarrhea 09/06/2019   GERD (gastroesophageal reflux disease)    Irregular heart beat    Per pt very now and then pt said he has an extra heart beat   Vomiting 09/06/2019    Family History  Problem Relation Age of Onset   Hypertension Mother    Achalasia Mother    Diabetes Father    COPD Father    Diabetes Maternal Grandmother    Colon cancer Neg Hx    Esophageal cancer Neg Hx    Rectal cancer Neg Hx    Stomach  cancer Neg Hx     Past Surgical History:  Procedure Laterality Date   LAPAROSCOPIC APPENDECTOMY N/A 12/28/2014   Procedure: APPENDECTOMY LAPAROSCOPIC;  Surgeon: Krystal Spinner, MD;  Location: [facility];  Service: General;  Laterality: N/A;   Social History   Occupational History   Occupation: Film/video editor  Tobacco Use   Smoking status: Never    Passive exposure: Never   Smokeless tobacco: Never  Vaping Use   Vaping status: Never Used  Substance and Sexual Activity   Alcohol use: Yes    Alcohol/week: 0.0 standard drinks of alcohol    Comment: once a year   Drug use: No   Sexual activity: Yes

## 2024-07-09 ENCOUNTER — Encounter: Payer: Self-pay | Admitting: Physical Therapy

## 2024-07-09 ENCOUNTER — Ambulatory Visit: Admitting: Physical Therapy

## 2024-07-09 DIAGNOSIS — M25562 Pain in left knee: Secondary | ICD-10-CM

## 2024-07-09 DIAGNOSIS — M6281 Muscle weakness (generalized): Secondary | ICD-10-CM | POA: Diagnosis not present

## 2024-07-09 DIAGNOSIS — Z7409 Other reduced mobility: Secondary | ICD-10-CM

## 2024-07-09 DIAGNOSIS — G8929 Other chronic pain: Secondary | ICD-10-CM

## 2024-07-09 NOTE — Therapy (Signed)
 OUTPATIENT PHYSICAL THERAPY LOWER EXTREMITY EVALUATION   Patient Name: Bobby Hobbs MRN: [id number] DOB:1980-11-02, 43 y.o., male Today's Date: 07/09/2024  END OF SESSION:  PT End of Session - 07/09/24 1257     Visit Number 1    Number of Visits 8    Date for Recertification  09/04/24    Authorization Type U[employer]    Authorization Time Period $10 copay    PT Start Time 0930    PT Stop Time 1014    PT Time Calculation (min) 44 min    Activity Tolerance Patient tolerated treatment well;Patient limited by pain    Behavior During Therapy [facility] for tasks assessed/performed          Past Medical History:  Diagnosis Date   Allergy     Asthma    Diarrhea 09/06/2019   GERD (gastroesophageal reflux disease)    Irregular heart beat    Per pt very now and then pt said he has an extra heart beat   Vomiting 09/06/2019   Past Surgical History:  Procedure Laterality Date   LAPAROSCOPIC APPENDECTOMY N/A 12/28/2014   Procedure: APPENDECTOMY LAPAROSCOPIC;  Surgeon: Krystal Spinner, MD;  Location: [facility];  Service: General;  Laterality: N/A;   Patient Active Problem List   Diagnosis Date Noted   Chronic pain of left knee 05/23/2024   Acute non-recurrent maxillary sinusitis 09/23/2022   Acute cough 09/23/2022   Asthma 09/23/2022   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 04/17/2020   Dyspnea 01/06/2020   Vomiting 09/06/2019   Diarrhea 09/06/2019   Pain in joint of left shoulder 03/27/2019   Chronic maxillary sinusitis 01/24/2019   Acute pain of left shoulder 01/30/2018   Neck pain on left side 01/30/2018   Muscle spasm 01/30/2018   Eyelid dermatitis, allergic/contact 09/16/2015   Rhinitis, allergic 09/16/2015   Acute gangrenous appendicitis with perforation s/p lap appy 12/28/2014 12/28/2014   Dental caries 06/03/2013    PCP: Dorina Dallas RIGGERS  REFERRING PROVIDER: Shirly Carlin CROME, PA-C   REFERRING DIAG: [phone] (ICD-10-CM) - Chronic pain of left  knee   THERAPY DIAG:  Chronic pain of left knee  Muscle weakness (generalized)  Impaired functional mobility and activity tolerance  Rationale for Evaluation and Treatment: Rehabilitation  ONSET DATE: 06/24/2024  MD referral to PT  SUBJECTIVE:   SUBJECTIVE STATEMENT: This 43yo male presented to Orthopedic MD with left knee chronic pain.  MRI and assessment with diagnosis of Synovitis of left knee. He got injection on 06/24/24 and it helped for couple of days only.  He referred to PT to improve knee function.   PERTINENT HISTORY: chronic left knee pain, asthma  DIAGNOSTIC FINDINGS: MRI 06/16/2024  mild signal in the medial meniscus but otherwise no structural abnormality is noted   PAIN:  NPRS scale: at rest up 3/10, standing / walking up to 7/10,  Pain location: left knee mainly on sides and edge of knee cap Pain description: achy, with work can throb Aggravating factors:  excessive walking like work, stairs, running, breaking up inmate fights at work.  Relieving factors:  prescription med, Advil   PRECAUTIONS: None  WEIGHT BEARING RESTRICTIONS: No  FALLS:  Has patient fallen in last 6 months? 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  PLOF: Independent  PATIENT GOALS:    to get knee functioning without pain, hobbies combat sports & shooting  Next MD visit: tbd   OBJECTIVE:   PATIENT SURVEYS:  Patient-Specific Activity Scoring Scheme  0 represents "unable to perform." 10 represents "able to perform at prior level. 0 1 2 3 4 5 6 7 8 9  10 (Date and Score)  Activity Eval     1. Stairs without pain  4    2. running  4    3. Job performance 4   4. Combat sports 4   5.    Score 4    Total score = sum of the activity  scores/number of activities Minimum detectable change (90%CI) for average score = 2 points Minimum detectable change (90%CI) for single activity score = 3 points  COGNITION: Overall cognitive status: WFL    SENSATION: WFL  PALPATION: Patient reports tenderness along anterior joint line medially and laterally and edges of patella.  Denies any pain over quad, hamstring or gastroc.   LOWER EXTREMITY ROM:   ROM Right eval Left eval  Hip flexion    Hip extension    Hip abduction    Hip adduction    Hip internal rotation    Hip external rotation    Knee flexion  110*  Knee extension  0*  Ankle dorsiflexion    Ankle plantarflexion    Ankle inversion    Ankle eversion     (Blank rows = not tested)  LOWER EXTREMITY MMT:  MMT Body weight 234# Right Eval HH dynameter Left Eval HH dynameter  Hip flexion    Hip extension    Hip abduction    Hip adduction    Hip internal rotation    Hip external rotation    Knee flexion    Knee extension 73.4#, 69.7# 71.55 avg  30% of BW 64#, 59.1# Pain 61.55 avg 86% of RLE 26% of BW   Ankle dorsiflexion    Ankle plantarflexion    Ankle inversion    Ankle eversion     (Blank rows = not tested)  FUNCTIONAL TESTS:  Patient demo his exercises of squat (poor technique too deep and weight shift) and lunge (reports LLE in back hurts worse)(with poor form).  GAIT: Distance walked: >100' Assistive device utilized: None Level of assistance: Modified independence  Stairs alternating pattern without rails with antalgic pattern (decreased LLE stance)                                                                                                                                                                        TODAY'S TREATMENT  DATE: 07/09/2024 Therapeutic Exercise: HEP instruction/performance c cues for techniques, handout provided.  Trial set performed of  each for comprehension and symptom assessment.  See below for exercise list  PATIENT EDUCATION:  Education details: HEP, POC Person educated: Patient Education method: Explanation, Demonstration, Verbal cues, and Handouts Education comprehension: verbalized understanding, returned demonstration, and verbal cues required  HOME EXERCISE PROGRAM: Access Code: [email or web address] URL: [email or web address] Date: 07/09/2024 Prepared by: Grayce Spatz  Exercises - standing knee extension band and single leg stance  - 1 x daily - 5 x weekly - 2-3 sets - 10 reps - 5 seconds hold - Mountain Climber on Counter  - 1 x daily - 5 x weekly - 2-3 sets - 10 reps - 5 seconds hold - Y-balance Reaches on floor - 1 x daily - 5 x weekly - 2-3 sets - 10 reps - 5 seconds hold - Y-balance Reaches on foam  - 1 x daily - 5 x weekly - 2-3 sets - 10 reps - 5 seconds hold  ASSESSMENT:  CLINICAL IMPRESSION: Patient is a 43 y.o. who comes to clinic with complaints of left knee chronic pain with mobility, strength and movement coordination deficits that impair their ability to perform usual daily and recreational functional activities without increase difficulty/symptoms at this time.  Patient to benefit from skilled PT services to address impairments and limitations to improve to previous level of function without restriction secondary to condition.   OBJECTIVE IMPAIRMENTS: Abnormal gait, decreased activity tolerance, decreased knowledge of condition, decreased strength, and pain.   ACTIVITY LIMITATIONS: carrying, lifting, standing, squatting, stairs, and locomotion level  PARTICIPATION LIMITATIONS: community activity and occupation  PERSONAL FACTORS: Time since onset of injury/illness/exacerbation and 1-2 comorbidities: see PMH are also affecting patient's functional outcome.   REHAB POTENTIAL: Good  CLINICAL DECISION MAKING: Stable/uncomplicated  EVALUATION COMPLEXITY: Low   GOALS: Goals reviewed with  patient? Yes  SHORT TERM GOALS: (target date for Short term goals 07/30/2024)   1.  Patient will demonstrate independent use of home exercise program to maintain progress from in clinic treatments.  Goal status: New  LONG TERM GOALS: (target dates for all long term goals  09/04/2024 )   1. Patient will demonstrate/report pain at worst less than or equal to 2/10 to facilitate minimal limitation in daily activity secondary to pain symptoms. Goal status: New   2. Patient will demonstrate independent use of home exercise program to facilitate ability to maintain/progress functional gains from skilled physical therapy services. Goal status: New   3. Patient will demonstrate Patient specific functional scale avg > or = 7 to indicate reduced disability due to condition.  Goal status: New   4.  Patient will demonstrate left LE MMT 5/5 (HH dynameter >35% of body weight) throughout to faciltiate usual transfers, stairs, squatting at [facility] for daily life.  Goal status: New   5.  set Y-balance test goal Goal status: New   PLAN:  PT FREQUENCY: 1x/week  PT DURATION: 8 weeks  PLANNED INTERVENTIONS: 97164- PT Re-evaluation, 97750- Physical Performance Testing, 97110-Therapeutic exercises, 97530- Therapeutic activity, 97112- Neuromuscular re-education, 97535- Self Care, 02859- Manual therapy, [phone]- Gait training, [phone]- Ionotophoresis 4mg /ml Dexamethasone , Patient/Family education, Balance training, Stair training, Taping, and Joint mobilization  PLAN FOR NEXT SESSION:  check & Update HEP,  do Y-balance test and set LTG.    Coron Rossano, PT, DPT 07/09/2024, 5:04 PM

## 2024-07-17 ENCOUNTER — Ambulatory Visit: Admitting: Rehabilitative and Restorative Service Providers"

## 2024-07-17 ENCOUNTER — Ambulatory Visit: Admitting: Family Medicine

## 2024-07-17 ENCOUNTER — Encounter: Payer: Self-pay | Admitting: Rehabilitative and Restorative Service Providers"

## 2024-07-17 ENCOUNTER — Encounter: Payer: Self-pay | Admitting: Family Medicine

## 2024-07-17 VITALS — BP 118/80 | HR 97 | Temp 98.7°F | Resp 16 | Ht 73.0 in | Wt 229.8 lb

## 2024-07-17 DIAGNOSIS — G8929 Other chronic pain: Secondary | ICD-10-CM

## 2024-07-17 DIAGNOSIS — R059 Cough, unspecified: Secondary | ICD-10-CM

## 2024-07-17 DIAGNOSIS — M6281 Muscle weakness (generalized): Secondary | ICD-10-CM | POA: Diagnosis not present

## 2024-07-17 DIAGNOSIS — M25562 Pain in left knee: Secondary | ICD-10-CM | POA: Diagnosis not present

## 2024-07-17 DIAGNOSIS — J0101 Acute recurrent maxillary sinusitis: Secondary | ICD-10-CM

## 2024-07-17 LAB — POCT INFLUENZA A/B
Influenza A, POC: NEGATIVE
Influenza B, POC: NEGATIVE

## 2024-07-17 LAB — POC COVID19 BINAXNOW: SARS Coronavirus 2 Ag: NEGATIVE

## 2024-07-17 MED ORDER — DOXYCYCLINE HYCLATE 100 MG PO TABS: 100.0000 mg | ORAL_TABLET | Freq: Two times a day (BID) | ORAL | 0 refills | Status: AC

## 2024-07-17 NOTE — Patient Instructions (Signed)
 Continue to push fluids, practice good hand hygiene, and cover your mouth if you cough. ? ?If you start having fevers, shaking or shortness of breath, seek immediate care. ? ?OK to take Tylenol 1000 mg (2 extra strength tabs) or 975 mg (3 regular strength tabs) every 6 hours as needed. ? ?Let us know if you need anything. ?

## 2024-07-17 NOTE — Therapy (Signed)
 OUTPATIENT PHYSICAL THERAPY TREATMENT   Patient Name: Bobby Hobbs MRN: [id number] DOB:1981-02-20, 43 y.o., male Today's Date: 07/17/2024  END OF SESSION:  PT End of Session - 07/17/24 1018     Visit Number 2    Number of Visits 8    Date for Recertification  09/04/24    Authorization Type [employer] $10 copay    PT Start Time 1008    PT Stop Time 1047    PT Time Calculation (min) 39 min    Activity Tolerance Patient tolerated treatment well    Behavior During Therapy [facility] for tasks assessed/performed           Past Medical History:  Diagnosis Date   Allergy     Asthma    Diarrhea 09/06/2019   GERD (gastroesophageal reflux disease)    Irregular heart beat    Per pt very now and then pt said he has an extra heart beat   Vomiting 09/06/2019   Past Surgical History:  Procedure Laterality Date   LAPAROSCOPIC APPENDECTOMY N/A 12/28/2014   Procedure: APPENDECTOMY LAPAROSCOPIC;  Surgeon: Krystal Spinner, MD;  Location: [facility];  Service: General;  Laterality: N/A;   Patient Active Problem List   Diagnosis Date Noted   Chronic pain of left knee 05/23/2024   Acute non-recurrent maxillary sinusitis 09/23/2022   Acute cough 09/23/2022   Asthma 09/23/2022   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 04/17/2020   Dyspnea 01/06/2020   Vomiting 09/06/2019   Diarrhea 09/06/2019   Pain in joint of left shoulder 03/27/2019   Chronic maxillary sinusitis 01/24/2019   Acute pain of left shoulder 01/30/2018   Neck pain on left side 01/30/2018   Muscle spasm 01/30/2018   Eyelid dermatitis, allergic/contact 09/16/2015   Rhinitis, allergic 09/16/2015   Acute gangrenous appendicitis with perforation s/p lap appy 12/28/2014 12/28/2014   Dental caries 06/03/2013    PCP: Dorina Dallas RIGGERS  REFERRING PROVIDER: Shirly Carlin CROME, PA-C   REFERRING DIAG: [phone] (ICD-10-CM) - Chronic pain of left knee   THERAPY DIAG:  Chronic pain of left knee  Muscle weakness  (generalized)  Rationale for Evaluation and Treatment: Rehabilitation  ONSET DATE: 06/24/2024  MD referral to PT  SUBJECTIVE:   SUBJECTIVE STATEMENT: Pt indicated having consistent pain symptoms, not better or worse from last visit.  Reported reaching exercise did cause some trouble but nothing major.   PERTINENT HISTORY: chronic left knee pain, asthma  DIAGNOSTIC FINDINGS: MRI 06/16/2024  mild signal in the medial meniscus but otherwise no structural abnormality is noted   PAIN:  NPRS scale:0/10 upon arrival, up to 3/10 in last 24 hours.  Pain location: left knee mainly on sides and edge of knee cap Pain description: achy, with work can throb Aggravating factors:  excessive walking like work, stairs, running, breaking up inmate fights at work.  Relieving factors:  prescription med, Advil   PRECAUTIONS: None  WEIGHT BEARING RESTRICTIONS: No  FALLS:  Has patient fallen in last 6 months? 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    PLOF: Independent  PATIENT GOALS:    to get knee functioning without pain, hobbies combat sports & shooting  Next MD visit: tbd   OBJECTIVE:   PATIENT SURVEYS:  Patient-Specific Activity Scoring Scheme  0 represents "  unable to perform." 10 represents "able to perform at prior level. 0 1 2 3 4 5 6 7 8 9  10 (Date and Score)  Activity Eval  07/09/2024    1. Stairs without pain  4    2. running  4    3. Job performance 4   4. Combat sports 4   5.    Score 4    Total score = sum of the activity scores/number of activities Minimum detectable change (90%CI) for average score = 2 points Minimum detectable change (90%CI) for single activity score = 3 points  COGNITION: 07/09/2024 Overall cognitive  status: WFL    SENSATION: 07/09/2024 WFL  PALPATION: 07/09/2024 Patient reports tenderness along anterior joint line medially and laterally and edges of patella.  Denies any pain over quad, hamstring or gastroc.   LOWER EXTREMITY ROM:   ROM Right Eval 07/09/2024 Left Eval 07/09/2024  Hip flexion    Hip extension    Hip abduction    Hip adduction    Hip internal rotation    Hip external rotation    Knee flexion  110*  Knee extension  0*  Ankle dorsiflexion    Ankle plantarflexion    Ankle inversion    Ankle eversion     (Blank rows = not tested)  LOWER EXTREMITY MMT:  MMT Body weight 234# Right Eval 07/09/2024  HH dynameter Left Eval 07/09/2024   HH dynameter  Hip flexion    Hip extension    Hip abduction    Hip adduction    Hip internal rotation    Hip external rotation    Knee flexion    Knee extension 73.4#, 69.7# 71.55 avg  30% of BW 64#, 59.1# Pain 61.55 avg 86% of RLE 26% of BW   Ankle dorsiflexion    Ankle plantarflexion    Ankle inversion    Ankle eversion     (Blank rows = not tested)  FUNCTIONAL TESTS:  07/09/2024 Patient demo his exercises of squat (poor technique too deep and weight shift) and lunge (reports LLE in back hurts worse)(with poor form).  GAIT: 07/09/2024 Distance walked: >100' Assistive device utilized: None Level of assistance: Modified independence  Stairs alternating pattern without rails with antalgic pattern (decreased LLE stance)                                                                                                                                                                        TODAY'S TREATMENT  DATE: 07/17/2024 Therex: Recumbent bike lvl 3 for ROM, seat 7  7 mins Knee extension machine double leg up, single leg lowering 20 lbs 2 x 10 bilateral (cues for gym use) Seated quad set with SLR x 15 bilateral with slow lowering  focus  Review of existing HEP for techniques, advice on use.   TherActivity (to improve stairs, squatting, ambulation, kneeling) Leg press single leg 62 lbs 2 x 15  (discussion for use at gym)   Neuro Re-ed: SLS with anterior, lateral, posterior reaching light touch focus x 10 bilateral (cues for use and adaptations for home)  Self Care Discussed pain monitoring scale for activity modification with focus on keeping pain in 0-3/10 range for appropriate intervention symptom response.  Discussed adaptations if higher pain noted.    TODAY'S TREATMENT                                                                          DATE: 07/09/2024 Therapeutic Exercise: HEP instruction/performance c cues for techniques, handout provided.  Trial set performed of each for comprehension and symptom assessment.  See below for exercise list  PATIENT EDUCATION:  07/09/2024 Education details: HEP, POC Person educated: Patient Education method: Explanation, Demonstration, Verbal cues, and Handouts Education comprehension: verbalized understanding, returned demonstration, and verbal cues required  HOME EXERCISE PROGRAM: Access Code: [email or web address] URL: [email or web address] Date: 07/09/2024 Prepared by: Grayce Spatz  Exercises - standing knee extension band and single leg stance  - 1 x daily - 5 x weekly - 2-3 sets - 10 reps - 5 seconds hold - Mountain Climber on Counter  - 1 x daily - 5 x weekly - 2-3 sets - 10 reps - 5 seconds hold - Y-balance Reaches on floor - 1 x daily - 5 x weekly - 2-3 sets - 10 reps - 5 seconds hold - Y-balance Reaches on foam  - 1 x daily - 5 x weekly - 2-3 sets - 10 reps - 5 seconds hold  ASSESSMENT:  CLINICAL IMPRESSION: Reviewed gym based strengthening additions and techniques cues and setup advice.  Continued to discussion importance of strengthening gains to promote improved tolerance to WB activity.  Continued skilled PT services indicated at this time.    Held y balance testing due to pain noted in increased knee flexion.  May assess in future.   OBJECTIVE IMPAIRMENTS: Abnormal gait, decreased activity tolerance, decreased knowledge of condition, decreased strength, and pain.   ACTIVITY LIMITATIONS: carrying, lifting, standing, squatting, stairs, and locomotion level  PARTICIPATION LIMITATIONS: community activity and occupation  PERSONAL FACTORS: Time since onset of injury/illness/exacerbation and 1-2 comorbidities: see PMH are also affecting patient's functional outcome.   REHAB POTENTIAL: Good  CLINICAL DECISION MAKING: Stable/uncomplicated  EVALUATION COMPLEXITY: Low   GOALS: Goals reviewed with patient? Yes  SHORT TERM GOALS: (target date for Short term goals 07/30/2024)   1.  Patient will demonstrate independent use of home exercise program to maintain progress from in clinic treatments.  Goal status: on going 07/17/2024  LONG TERM GOALS: (target dates for all long term goals  09/04/2024 )   1. Patient will demonstrate/report pain at worst less than or equal to 2/10 to facilitate minimal limitation in daily activity secondary to pain symptoms.  Goal status: New   2. Patient will demonstrate independent use of home exercise program to facilitate ability to maintain/progress functional gains from skilled physical therapy services. Goal status: New   3. Patient will demonstrate Patient specific functional scale avg > or = 7 to indicate reduced disability due to condition.  Goal status: New   4.  Patient will demonstrate left LE MMT 5/5 (HH dynameter >35% of body weight) throughout to faciltiate usual transfers, stairs, squatting at [facility] for daily life.  Goal status: New   5.  set Y-balance test goal Goal status: New   PLAN:  PT FREQUENCY: 1x/week  PT DURATION: 8 weeks  PLANNED INTERVENTIONS: 97164- PT Re-evaluation, 97750- Physical Performance Testing, 97110-Therapeutic exercises, 97530- Therapeutic activity,  [id number]- Neuromuscular re-education, 97535- Self Care, 02859- Manual therapy, [phone]- Gait training, [phone]- Ionotophoresis 4mg /ml Dexamethasone , Patient/Family education, Balance training, Stair training, Taping, and Joint mobilization  PLAN FOR NEXT SESSION: Progressive strengthening program, dynamic balance improvements.    Ozell Silvan, PT, DPT, OCS, ATC 07/17/24  10:48 AM

## 2024-07-17 NOTE — Progress Notes (Signed)
 Chief Complaint  Patient presents with   Sinus Problem    Bobby Hobbs here for URI complaints.  Duration: 1 week; worsening Associated symptoms: sinus congestion, sinus pain, rhinorrhea, and coughing from drainage Denies: itchy watery eyes, ear pain, ear drainage, sore throat, wheezing, shortness of breath, myalgia, N/V, and fevers Treatment to date: Amox, Mucinex , OTC meds Sick contacts: No  Past Medical History:  Diagnosis Date   Allergy     Asthma    Diarrhea 09/06/2019   GERD (gastroesophageal reflux disease)    Irregular heart beat    Per pt very now and then pt said he has an extra heart beat   Vomiting 09/06/2019    Objective BP 118/80 (BP Location: Left Arm, Patient Position: Sitting)   Pulse 97   Temp 98.7 F (37.1 C) (Oral)   Resp 16   Ht 6' 1 (1.854 m)   Wt 229 lb 12.8 oz (104.2 kg)   SpO2 96%   BMI 30.32 kg/m  General: Awake, alert, appears stated age HEENT: AT, [place], ears patent b/l and TM's neg, nares patent w/ clear discharge, pharynx pink and without exudates, MMM, +ttp over b/l max sinuses Neck: No masses or asymmetry Heart: RRR Lungs: CTAB, no accessory muscle use Psych: Age appropriate judgment and insight, normal mood and affect  Recurrent maxillary sinusitis - Plan: doxycycline  (VIBRA -TABS) 100 MG tablet  Worsening for >3 d. 7 d of doxy. Continue to push fluids, practice good hand hygiene, cover mouth when coughing. F/u prn. If starting to experience fevers, shaking, or shortness of breath, seek immediate care. Pt voiced understanding and agreement to the plan.  Mabel [place], DO 07/17/24 2:19 PM

## 2024-07-17 NOTE — Addendum Note (Signed)
 Addended by: Kwamaine Cuppett M on: 07/17/2024 02:39 PM   Modules accepted: Orders

## 2024-07-19 ENCOUNTER — Encounter: Payer: Self-pay | Admitting: Family Medicine
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
## 2024-07-23 ENCOUNTER — Encounter: Payer: Self-pay | Admitting: Rehabilitative and Restorative Service Providers"

## 2024-07-23 ENCOUNTER — Ambulatory Visit: Admitting: Rehabilitative and Restorative Service Providers"

## 2024-07-23 DIAGNOSIS — M25562 Pain in left knee: Secondary | ICD-10-CM | POA: Diagnosis not present

## 2024-07-23 DIAGNOSIS — G8929 Other chronic pain: Secondary | ICD-10-CM | POA: Diagnosis not present

## 2024-07-23 DIAGNOSIS — M6281 Muscle weakness (generalized): Secondary | ICD-10-CM | POA: Diagnosis not present

## 2024-07-30 ENCOUNTER — Ambulatory Visit: Admitting: Rehabilitative and Restorative Service Providers"

## 2024-07-30 ENCOUNTER — Encounter: Payer: Self-pay | Admitting: Rehabilitative and Restorative Service Providers"

## 2024-07-30 DIAGNOSIS — G8929 Other chronic pain: Secondary | ICD-10-CM | POA: Diagnosis not present

## 2024-07-30 DIAGNOSIS — M25562 Pain in left knee: Secondary | ICD-10-CM

## 2024-07-30 DIAGNOSIS — M6281 Muscle weakness (generalized): Secondary | ICD-10-CM | POA: Diagnosis not present
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
## 2024-08-03 ENCOUNTER — Other Ambulatory Visit: Payer: Self-pay | Admitting: Medical

## 2024-08-05 ENCOUNTER — Telehealth: Payer: Self-pay | Admitting: Rehabilitative and Restorative Service Providers"

## 2024-08-05 ENCOUNTER — Encounter: Admitting: Rehabilitative and Restorative Service Providers"
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
## 2024-08-05 NOTE — Telephone Encounter (Signed)
 Called patient after 15 mins no show for appointment today.  Pt indicated he forgot the appointment.  Currently doesn't have additional visits scheduled past today.  May call back to schedule.   Ozell Silvan, PT, DPT, OCS, ATC 08/05/24  9:02 AM

## 2024-08-12 ENCOUNTER — Encounter: Payer: Self-pay | Admitting: Radiology

## 2024-09-26 ENCOUNTER — Ambulatory Visit: Admitting: Surgical

## 2024-09-27 ENCOUNTER — Ambulatory Visit: Payer: Self-pay

## 2024-09-27 ENCOUNTER — Encounter: Payer: Self-pay | Admitting: Student

## 2024-09-27 ENCOUNTER — Ambulatory Visit: Admitting: Student

## 2024-09-27 VITALS — BP 108/79 | HR 76 | Temp 98.1°F | Ht 73.0 in | Wt <= 1120 oz

## 2024-09-27 DIAGNOSIS — R0981 Nasal congestion: Secondary | ICD-10-CM | POA: Diagnosis not present

## 2024-09-27 DIAGNOSIS — R52 Pain, unspecified: Secondary | ICD-10-CM | POA: Diagnosis not present

## 2024-09-27 DIAGNOSIS — J069 Acute upper respiratory infection, unspecified: Secondary | ICD-10-CM | POA: Diagnosis not present

## 2024-09-27 DIAGNOSIS — J029 Acute pharyngitis, unspecified: Secondary | ICD-10-CM | POA: Diagnosis not present

## 2024-09-27 DIAGNOSIS — R051 Acute cough: Secondary | ICD-10-CM | POA: Diagnosis not present

## 2024-09-27 LAB — POCT INFLUENZA A/B
Influenza A, POC: NEGATIVE
Influenza B, POC: NEGATIVE

## 2024-09-27 LAB — POC COVID19 BINAXNOW: SARS Coronavirus 2 Ag: NEGATIVE

## 2024-09-27 MED ORDER — GUAIFENESIN ER 600 MG PO TB12: 1200.0000 mg | ORAL_TABLET | Freq: Two times a day (BID) | ORAL | 0 refills | Status: AC

## 2024-09-27 MED ORDER — BENZONATATE 100 MG PO CAPS: 100.0000 mg | ORAL_CAPSULE | Freq: Three times a day (TID) | ORAL | 0 refills | Status: DC | PRN

## 2024-09-27 NOTE — Telephone Encounter (Signed)
" °  FYI Only or Action Required?: FYI only for provider: appointment scheduled on 12.19.25.  Patient was last seen in primary care on 07/17/2024 by Frann Mabel Mt, DO.  Called Nurse Triage reporting Cough.  Symptoms began several days ago.  Interventions attempted: OTC medications: Mucinex .  Symptoms are: stable.  Triage Disposition: See Physician Within 24 Hours  Patient/caregiver understands and will follow disposition?: Yes  Copied from CRM #[id number]. Topic: Clinical - Red Word Triage >> Sep 27, 2024  8:42 AM Antwanette L wrote: Red Word that prompted transfer to Nurse Triage: The patient is experiencing loss of voice, nasal congestion, headache, productive cough with yellow-green phlegm, headaches, and  body aches Reason for Disposition  [1] Continuous (nonstop) coughing interferes with work or school AND [2] no improvement using cough treatment per Care Advice  Answer Assessment - Initial Assessment Questions 1. ONSET: When did the cough begin?       3-4 days  2. SEVERITY: How bad is the cough today?       About the same  3. SPUTUM: Describe the color of your sputum (e.g., none, dry cough; clear, white, yellow, green)      green  4. HEMOPTYSIS: Are you coughing up any blood? If Yes, ask: How much? (e.g., flecks, streaks, tablespoons, etc.)      denies  5. DIFFICULTY BREATHING: Are you having difficulty breathing? If Yes, ask: How bad is it? (e.g., mild, moderate, severe)       denies  6. FEVER: Do you have a fever? If Yes, ask: What is your temperature, how was it measured, and when did it start?      denies  7. CARDIAC HISTORY: Do you have any history of heart disease? (e.g., heart attack, congestive heart failure)       Per pt's chart, pt does not have cardiac hx  8. LUNG HISTORY: Do you have any history of lung disease?  (e.g., pulmonary embolus, asthma, emphysema)      Per pt's chart, sinusitis and asthma  9. PE RISK FACTORS: Do you  have a history of blood clots? (or: recent major surgery, recent prolonged travel, bedridden)      Per pt's chart, Pt does not have pe risk factors  10. OTHER SYMPTOMS: Pt denies wheezing, chest pain   Pt reports body aches, sore throat, headache and runny nose  Denies loss of taste/smell  Pt reports cough, headache, sore throat Pt advised to drink plenty of fluids Pt is taking OTC Mucinex  Pt scheduled for a visit on 12.19.25 for further evaluation. Pt agrees with plan of care, will call back for any worsening symptoms  Protocols used: Cough - Acute Productive-A-AH  "
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
## 2024-09-27 NOTE — Telephone Encounter (Signed)
 Pt has appt today

## 2024-10-17 ENCOUNTER — Other Ambulatory Visit: Payer: Self-pay | Admitting: Student

## 2024-10-17 ENCOUNTER — Ambulatory Visit: Payer: Self-pay | Admitting: *Deleted

## 2024-10-17 DIAGNOSIS — J069 Acute upper respiratory infection, unspecified: Secondary | ICD-10-CM

## 2024-10-17 MED ORDER — AMOXICILLIN-POT CLAVULANATE 875-125 MG PO TABS: 1.0000 | ORAL_TABLET | Freq: Two times a day (BID) | ORAL | 0 refills | Status: DC

## 2024-10-17 MED ORDER — BENZONATATE 100 MG PO CAPS: 100.0000 mg | ORAL_CAPSULE | Freq: Three times a day (TID) | ORAL | 0 refills | Status: DC | PRN

## 2024-10-17 NOTE — Telephone Encounter (Signed)
 This patient saw you on 12/19.  He still has a cough and cannot come in due to work.  Do you recommend anything for him or do you want me to advised he does indeed need an appointment?

## 2024-10-17 NOTE — Telephone Encounter (Signed)
 Pt stated that he went to an urgent care and was seen.  They did do an xray.  Advised that if he is no better after the treatment that's given by urgent care then he needs to follow up with us .  Called pharmacy and canceled rxs that we sent in.

## 2024-10-17 NOTE — Telephone Encounter (Signed)
 FYI Only or Action Required?: Action required by provider: request for appointment, update on patient condition, and requesting any recommendations.  Patient was last seen in primary care on 09/27/2024 by Wheeler Harlene CROME, NP.  Called Nurse Triage reporting Cough.  Symptoms began Saturday Sunday, and has had cough on and off since Dec..  Interventions attempted: OTC medications: cough drops , Prescription medications: prednisone , tessalon  , and Rest, hydration, or home remedies.  Symptoms are: gradually worsening.  Triage Disposition: See Physician Within 24 Hours  Patient/caregiver understands and will follow disposition?: No, wishes to speak with PCP    No available appt until tomorrow. Recommended UC if needs to be seen today. Patient reports work is giving him hard time for missing work. Denies chest pain no difficulty breathing hx asthma. No fever           Copied from CRM #[id number]. Topic: Clinical - Red Word Triage >> Oct 17, 2024  8:21 AM Tiffini S wrote: [facility] that prompted transfer to Nurse Triage: Started a week ago and symptoms are worsening with a hard cough, sinus congestion, vomit, hot/ cold,  asthma attack Reason for Disposition  SEVERE coughing spells (e.g., whooping sound after coughing, vomiting after coughing)  Answer Assessment - Initial Assessment Questions No available OV until tomorrow. Patient reports he is missing work and needs to be seen by someone. Recommended UC if patient does not want to wait until tomorrow for appt. Please advise if any availability today .       1. ONSET: When did the cough begin?      On going since December and got  better and now coughing worse 2. SEVERITY: How bad is the cough today?      Worsening causes vomiting 3. SPUTUM: Describe the color of your sputum (e.g., none, dry cough; clear, white, yellow, green)     Clear to green  4. HEMOPTYSIS: Are you coughing up any blood? If Yes, ask: How much?  (e.g., flecks, streaks, tablespoons, etc.)     na 5. DIFFICULTY BREATHING: Are you having difficulty breathing? If Yes, ask: How bad is it? (e.g., mild, moderate, severe)      None  6. FEVER: Do you have a fever? If Yes, ask: What is your temperature, how was it measured, and when did it start?     Unsure, gets hot and then cold  7. CARDIAC HISTORY: Do you have any history of heart disease? (e.g., heart attack, congestive heart failure)      na 8. LUNG HISTORY: Do you have any history of lung disease?  (e.g., pulmonary embolus, asthma, emphysema)     Hx asthma  9. PE RISK FACTORS: Do you have a history of blood clots? (or: recent major surgery, recent prolonged travel, bedridden)     na 10. OTHER SYMPTOMS: Do you have any other symptoms? (e.g., runny nose, wheezing, chest pain)       Constant cough, hx asthma, sinus congestion nose blocked  at times. 11. PREGNANCY: Is there any chance you are pregnant? When was your last menstrual period?       na 12. TRAVEL: Have you traveled out of the country in the last month? (e.g., travel history, exposures)       na  Protocols used: Cough - Acute Productive-A-AH

## 2024-10-17 NOTE — Addendum Note (Signed)
 Addended by: ESTELLE GILLIS D on: 10/17/2024 01:04 PM   Modules accepted: Orders

## 2024-10-24 ENCOUNTER — Ambulatory Visit: Admitting: Surgical

## 2024-11-13 ENCOUNTER — Ambulatory Visit: Admitting: Family Medicine

## 2024-11-13 ENCOUNTER — Encounter: Payer: Self-pay | Admitting: Family Medicine

## 2024-11-13 ENCOUNTER — Ambulatory Visit: Payer: Self-pay

## 2024-11-13 VITALS — BP 126/82 | HR 100 | Temp 98.0°F | Resp 16 | Ht 73.0 in | Wt 241.0 lb

## 2024-11-13 DIAGNOSIS — M25562 Pain in left knee: Secondary | ICD-10-CM

## 2024-11-13 MED ORDER — MELOXICAM 15 MG PO TABS: 15.0000 mg | ORAL_TABLET | Freq: Every day | ORAL | 0 refills | Status: AC

## 2024-11-13 NOTE — Telephone Encounter (Signed)
 FYI Only or Action Required?: FYI only for provider: appointment scheduled on 2/4.  Patient was last seen in primary care on 09/27/2024 by Wheeler Harlene CROME, NP.  Called Nurse Triage reporting Fall.  Symptoms began several days ago.  Interventions attempted: OTC medications: Advil  and Rest, hydration, or home remedies.  Symptoms are: gradually worsening.  Triage Disposition: See Physician Within 24 Hours  Patient/caregiver understands and will follow disposition?: Yes  Message from Macario HERO sent at 11/13/2024  8:31 AM EST  Reason for Triage: Patient called said he fell Sunday morning and it's been very painful to walk.   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 He has a bad left knee at baseline. Initial swelling and redness has resolved but still limping and hard to get around.   Denies weakness or paresthesias, color or temp change.   Appt with PCP office 2/4 to assess.   1. MECHANISM: How did the injury happen? (e.g., twisting injury, direct blow)      Slipped on the ice and twisted and smacked it on the ice 2. ONSET: When did the injury happen? (e.g., minutes, hours ago)      Sunday 2/1 3. LOCATION: Where is the injury located?      Left leg/knee 4. APPEARANCE of INJURY: What does the injury look like?  (e.g., deformity of leg)     Back to normal- initial swelling and redness 5. SEVERITY: Can you put weight on that leg? Can you walk?      Limping, hurts to walk on it  6. SIZE: For cuts, bruises, or swelling, ask: How large is it? (e.g., inches or centimeters)      denies 7. PAIN: Is there pain? If Yes, ask: How bad is the pain?   What does it keep you from doing? (Scale 0-10; or  none, mild, moderate, severe)     5/10- advil  takes the edge off 9. OTHER SYMPTOMS: Do you have any other symptoms?      Denies numbness, weakness  Protocols used: Leg Injury-A-AH

## 2024-11-13 NOTE — Patient Instructions (Addendum)
Ice/cold pack over area for 10-15 min twice daily.  Heat (pad or rice pillow in microwave) over affected area, 10-15 minutes twice daily.   OK to take Tylenol 1000 mg (2 extra strength tabs) or 975 mg (3 regular strength tabs) every 6 hours as needed.  Let us know if you need anything.  Semimembranosus Tendinitis Rehab  It is normal to feel mild stretching, pulling, tightness, or discomfort as you do these exercises, but you should stop right away if you feel sudden pain or your pain gets worse.  Stretching and range of motion exercises These exercises warm up your muscles and joints and improve the movement and flexibility of your thigh. These exercises also help to relieve pain, numbness, and tingling. Exercise A: Hamstring stretch, supine    Lie on your back. Loop a belt or towel across the ball of your left / right foot The ball of your foot is on the walking surface, right under your toes. Straighten your left / right knee and slowly pull on the belt to raise your leg. Stop when you feel a gentle stretch behind your left / right knee or thigh. Do not allow the knee to bend. Keep your other leg flat on the floor. Hold this position for 30 seconds. Repeat 2 times. Complete this exercise 3 times a week. Strengthening exercises These exercises build strength and endurance in your thigh. Endurance is the ability to use your muscles for a long time, even after they get tired. Exercise B: Straight leg raises (hip extensors) Lie on your belly on a bed or a firm surface with a pillow under your hips. Bend your left / right knee so your foot is straight up in the air. Squeeze your buttock muscles and lift your left / right thigh off the bed. Do not let your back arch. Hold this position for 3 seconds. Slowly return to the starting position. Let your muscles relax completely before you do another repetition. Repeat 2 times. Complete this exercise 3 times a week. Exercise C: Bridge (hip  extensors)     Lie on your back on a firm surface with your knees bent and your feet flat on the floor. Tighten your buttocks muscles and lift your bottom off the floor until your trunk is level with your thighs. You should feel the muscles working in your buttocks and the back of your thighs. If you do not feel these muscles, slide your feet 1-2 inches (2.5-5 cm) farther away from your buttocks. Do not arch your back. Hold this position for 3 seconds. Slowly lower your hips to the starting position. Let your buttocks muscles relax completely between repetitions. If this exercise is too easy, try doing it with your arms crossed over your chest. Repeat 2 times. Complete this exercise 3 times a week. Exercise D: Hamstring eccentric, prone Lie on your belly on a bed or on the floor. Start with your legs straight. Cross your legs at the ankles with your left / right leg on top. Using your bottom leg to do the work, bend both knees. Using just your left / right leg alone, slowly lower your leg back down toward the bed. Add a 5 lb weight as told by your health care provider. Let your muscles relax completely between repetitions. Repeat 2 times. Complete this exercise 3 times a week. Exercise E: Squats Stand in front of a table, with your feet and knees pointing straight ahead. You may rest your hands on the table  for balance but not for support. Slowly bend your knees and lower your hips like you are going to sit in a chair. Keep your thighs straight or pointed slightly outward. Keep your weight over your heels, not over your toes. Keep your lower legs upright so they are parallel with the table legs. Do not let your hips go lower than your knees. Stop when your knees are bent to the shape of an upside-down letter "L" (90 degree angle). Do not bend lower than told by your health care provider. If your knee pain increases, do not bend as low. Hold the squat position 1-2 seconds. Slowly push with  your legs to return to standing. Do not use your hands to pull yourself to standing. Repeat 2 times. Complete this exercise 3 times a week. Make sure you discuss any questions you have with your health care provider. Document Released: 09/26/2005 Document Revised: 06/02/2016 Document Reviewed: 06/30/2015 Elsevier Interactive Patient Education  [employer].

## 2024-11-13 NOTE — Telephone Encounter (Signed)
 Appt scheduled

## 2024-11-13 NOTE — Progress Notes (Signed)
 Musculoskeletal Exam  Patient: Bobby Hobbs DOB: [birth year]  DOS: 11/13/2024  SUBJECTIVE:  Chief Complaint:   Chief Complaint  Patient presents with   Knee Injury    Knee Injury Fall     [name] is a 44 y.o.  male for evaluation and treatment of L knee pain.   Onset:  3 days ago. Fell on ice. Location: sides of knee Character:  aching and then sharp when he wt bears  Progression of issue:  has slightly improved Associated symptoms: limping, swelling initially No bruising, redness, catching/locking Treatment: to date has been rest, ice, and OTC NSAIDS.   Neurovascular symptoms: no  Past Medical History:  Diagnosis Date   Allergy     Asthma    Diarrhea 09/06/2019   GERD (gastroesophageal reflux disease)    Irregular heart beat    Per pt very now and then pt said he has an extra heart beat   Vomiting 09/06/2019    Objective: VITAL SIGNS: BP 126/82 (BP Location: Left Arm, Patient Position: Sitting)   Pulse 100   Temp 98 F (36.7 C) (Oral)   Resp 16   Ht 6' 1 (1.854 m)   Wt 241 lb (109.3 kg)   SpO2 96%   BMI 31.80 kg/m  Constitutional: Well formed, well developed. No acute distress. Thorax & Lungs: No accessory muscle use Musculoskeletal: L knee.   Normal active range of motion: yes.   Normal passive range of motion: yes Tenderness to palpation: yes-over the lateral plica on the femur, patellar tendon, medial distal hamstring Deformity: no Ecchymosis: no Tests positive: None Tests negative: Lachman's, Stines, McMurray's, patellar apprehension/grind, varus/valgus stress Neurologic: Normal sensory function.  Psychiatric: Normal mood. Age appropriate judgment and insight. Alert & oriented x 3.    Assessment:  Acute pain of left knee - Plan: meloxicam  (MOBIC ) 15 MG tablet  Plan: Suspect lateral knee plica syndrome, patellar tendinitis, distal hamstring strain.  He has knee rehab from physical therapy.  Hamstring stretches/exercises,  heat, ice, Tylenol .  Mobic  daily as needed.  Letter for work provided allowing him to go back Monday or sooner if he is feeling better. F/u as originally scheduled with his regular PCP. The patient voiced understanding and agreement to the plan.   Mabel Mt Johnson, DO 11/13/24  10:35 AM
